# Patient Record
Sex: Female | Born: 1965
Health system: Southern US, Community
[De-identification: ages and names within clinical notes are randomized; demographics above are authoritative.]

## PROBLEM LIST (undated history)

## (undated) DIAGNOSIS — J45909 Unspecified asthma, uncomplicated: Secondary | ICD-10-CM

## (undated) DIAGNOSIS — J449 Chronic obstructive pulmonary disease, unspecified: Secondary | ICD-10-CM

## (undated) HISTORY — PX: REDUCTION MAMMAPLASTY: SUR839

---

## 1991-08-10 HISTORY — PX: BREAST REDUCTION SURGERY: SHX8

## 1997-03-11 HISTORY — PX: TUBAL LIGATION: SHX77

## 2003-12-11 ENCOUNTER — Emergency Department: Payer: Self-pay | Admitting: Emergency Medicine

## 2004-02-29 ENCOUNTER — Emergency Department: Payer: Self-pay | Admitting: Emergency Medicine

## 2004-08-18 ENCOUNTER — Emergency Department: Payer: Self-pay | Admitting: Emergency Medicine

## 2005-02-08 ENCOUNTER — Other Ambulatory Visit: Payer: Self-pay

## 2005-02-08 ENCOUNTER — Emergency Department: Payer: Self-pay | Admitting: Emergency Medicine

## 2005-03-16 ENCOUNTER — Emergency Department: Payer: Self-pay | Admitting: Emergency Medicine

## 2005-06-10 ENCOUNTER — Encounter: Payer: Self-pay | Admitting: Rheumatology

## 2005-08-13 ENCOUNTER — Ambulatory Visit: Payer: Self-pay | Admitting: Rheumatology

## 2007-06-13 ENCOUNTER — Other Ambulatory Visit: Payer: Self-pay

## 2007-06-13 ENCOUNTER — Emergency Department: Payer: Self-pay | Admitting: Unknown Physician Specialty

## 2009-02-20 ENCOUNTER — Ambulatory Visit: Payer: Self-pay | Admitting: Family Medicine

## 2009-03-11 HISTORY — PX: ABLATION: SHX5711

## 2010-10-18 ENCOUNTER — Ambulatory Visit: Payer: Self-pay | Admitting: Pain Medicine

## 2010-10-26 ENCOUNTER — Ambulatory Visit: Payer: Self-pay | Admitting: Pain Medicine

## 2011-04-22 DIAGNOSIS — J45909 Unspecified asthma, uncomplicated: Secondary | ICD-10-CM | POA: Diagnosis not present

## 2011-04-22 DIAGNOSIS — K5900 Constipation, unspecified: Secondary | ICD-10-CM | POA: Diagnosis not present

## 2011-04-22 DIAGNOSIS — J019 Acute sinusitis, unspecified: Secondary | ICD-10-CM | POA: Diagnosis not present

## 2011-04-29 DIAGNOSIS — K5900 Constipation, unspecified: Secondary | ICD-10-CM | POA: Diagnosis not present

## 2011-04-29 DIAGNOSIS — J45909 Unspecified asthma, uncomplicated: Secondary | ICD-10-CM | POA: Diagnosis not present

## 2011-04-29 DIAGNOSIS — J329 Chronic sinusitis, unspecified: Secondary | ICD-10-CM | POA: Diagnosis not present

## 2011-09-09 DIAGNOSIS — R609 Edema, unspecified: Secondary | ICD-10-CM | POA: Diagnosis not present

## 2011-09-09 DIAGNOSIS — J069 Acute upper respiratory infection, unspecified: Secondary | ICD-10-CM | POA: Diagnosis not present

## 2011-09-09 DIAGNOSIS — M2550 Pain in unspecified joint: Secondary | ICD-10-CM | POA: Diagnosis not present

## 2011-12-05 ENCOUNTER — Ambulatory Visit: Payer: Self-pay | Admitting: Family Medicine

## 2011-12-05 DIAGNOSIS — J309 Allergic rhinitis, unspecified: Secondary | ICD-10-CM | POA: Diagnosis not present

## 2011-12-05 DIAGNOSIS — E049 Nontoxic goiter, unspecified: Secondary | ICD-10-CM | POA: Diagnosis not present

## 2011-12-05 DIAGNOSIS — J45909 Unspecified asthma, uncomplicated: Secondary | ICD-10-CM | POA: Diagnosis not present

## 2011-12-05 DIAGNOSIS — R059 Cough, unspecified: Secondary | ICD-10-CM | POA: Diagnosis not present

## 2011-12-05 DIAGNOSIS — J329 Chronic sinusitis, unspecified: Secondary | ICD-10-CM | POA: Diagnosis not present

## 2011-12-17 ENCOUNTER — Ambulatory Visit: Payer: Self-pay | Admitting: Family Medicine

## 2011-12-17 DIAGNOSIS — E041 Nontoxic single thyroid nodule: Secondary | ICD-10-CM | POA: Diagnosis not present

## 2011-12-17 DIAGNOSIS — E042 Nontoxic multinodular goiter: Secondary | ICD-10-CM | POA: Diagnosis not present

## 2011-12-24 DIAGNOSIS — K219 Gastro-esophageal reflux disease without esophagitis: Secondary | ICD-10-CM | POA: Diagnosis not present

## 2011-12-24 DIAGNOSIS — R490 Dysphonia: Secondary | ICD-10-CM | POA: Diagnosis not present

## 2011-12-24 DIAGNOSIS — R6889 Other general symptoms and signs: Secondary | ICD-10-CM | POA: Diagnosis not present

## 2011-12-24 DIAGNOSIS — J3489 Other specified disorders of nose and nasal sinuses: Secondary | ICD-10-CM | POA: Diagnosis not present

## 2011-12-24 DIAGNOSIS — E041 Nontoxic single thyroid nodule: Secondary | ICD-10-CM | POA: Diagnosis not present

## 2011-12-24 DIAGNOSIS — J301 Allergic rhinitis due to pollen: Secondary | ICD-10-CM | POA: Diagnosis not present

## 2012-01-03 DIAGNOSIS — E049 Nontoxic goiter, unspecified: Secondary | ICD-10-CM | POA: Diagnosis not present

## 2012-01-03 DIAGNOSIS — J329 Chronic sinusitis, unspecified: Secondary | ICD-10-CM | POA: Diagnosis not present

## 2012-01-03 DIAGNOSIS — M549 Dorsalgia, unspecified: Secondary | ICD-10-CM | POA: Diagnosis not present

## 2012-01-03 DIAGNOSIS — J45909 Unspecified asthma, uncomplicated: Secondary | ICD-10-CM | POA: Diagnosis not present

## 2012-07-09 DIAGNOSIS — E049 Nontoxic goiter, unspecified: Secondary | ICD-10-CM | POA: Diagnosis not present

## 2012-07-09 DIAGNOSIS — J309 Allergic rhinitis, unspecified: Secondary | ICD-10-CM | POA: Diagnosis not present

## 2012-07-09 DIAGNOSIS — J45909 Unspecified asthma, uncomplicated: Secondary | ICD-10-CM | POA: Diagnosis not present

## 2012-07-13 ENCOUNTER — Ambulatory Visit: Payer: Self-pay | Admitting: Family Medicine

## 2012-07-13 DIAGNOSIS — K810 Acute cholecystitis: Secondary | ICD-10-CM | POA: Diagnosis not present

## 2012-07-13 DIAGNOSIS — R141 Gas pain: Secondary | ICD-10-CM | POA: Diagnosis not present

## 2012-07-13 DIAGNOSIS — R1084 Generalized abdominal pain: Secondary | ICD-10-CM | POA: Diagnosis not present

## 2012-07-20 ENCOUNTER — Ambulatory Visit: Payer: Medicare Other | Admitting: General Surgery

## 2012-07-30 ENCOUNTER — Ambulatory Visit: Payer: Medicare Other | Admitting: General Surgery

## 2012-08-07 DIAGNOSIS — M545 Low back pain: Secondary | ICD-10-CM | POA: Diagnosis not present

## 2012-08-07 DIAGNOSIS — M65849 Other synovitis and tenosynovitis, unspecified hand: Secondary | ICD-10-CM | POA: Diagnosis not present

## 2012-08-07 DIAGNOSIS — E049 Nontoxic goiter, unspecified: Secondary | ICD-10-CM | POA: Diagnosis not present

## 2012-08-07 DIAGNOSIS — K5900 Constipation, unspecified: Secondary | ICD-10-CM | POA: Diagnosis not present

## 2012-08-14 DIAGNOSIS — K5900 Constipation, unspecified: Secondary | ICD-10-CM | POA: Diagnosis not present

## 2012-08-14 DIAGNOSIS — J45909 Unspecified asthma, uncomplicated: Secondary | ICD-10-CM | POA: Diagnosis not present

## 2012-08-14 DIAGNOSIS — E049 Nontoxic goiter, unspecified: Secondary | ICD-10-CM | POA: Diagnosis not present

## 2012-08-14 DIAGNOSIS — J329 Chronic sinusitis, unspecified: Secondary | ICD-10-CM | POA: Diagnosis not present

## 2012-08-17 ENCOUNTER — Ambulatory Visit: Payer: Medicare Other | Admitting: General Surgery

## 2012-10-13 ENCOUNTER — Ambulatory Visit: Payer: Self-pay | Admitting: General Surgery

## 2012-11-10 ENCOUNTER — Encounter: Payer: Self-pay | Admitting: *Deleted

## 2013-03-18 DIAGNOSIS — E049 Nontoxic goiter, unspecified: Secondary | ICD-10-CM | POA: Diagnosis not present

## 2013-03-18 DIAGNOSIS — S93409A Sprain of unspecified ligament of unspecified ankle, initial encounter: Secondary | ICD-10-CM | POA: Diagnosis not present

## 2013-03-18 DIAGNOSIS — M25579 Pain in unspecified ankle and joints of unspecified foot: Secondary | ICD-10-CM | POA: Diagnosis not present

## 2013-03-18 DIAGNOSIS — K5900 Constipation, unspecified: Secondary | ICD-10-CM | POA: Diagnosis not present

## 2013-03-19 ENCOUNTER — Ambulatory Visit: Payer: Self-pay | Admitting: Family Medicine

## 2013-03-19 DIAGNOSIS — S8263XA Displaced fracture of lateral malleolus of unspecified fibula, initial encounter for closed fracture: Secondary | ICD-10-CM | POA: Diagnosis not present

## 2013-03-22 DIAGNOSIS — S93409A Sprain of unspecified ligament of unspecified ankle, initial encounter: Secondary | ICD-10-CM | POA: Diagnosis not present

## 2013-10-26 DIAGNOSIS — F17200 Nicotine dependence, unspecified, uncomplicated: Secondary | ICD-10-CM | POA: Diagnosis not present

## 2013-10-26 DIAGNOSIS — J449 Chronic obstructive pulmonary disease, unspecified: Secondary | ICD-10-CM | POA: Diagnosis not present

## 2013-10-26 DIAGNOSIS — R05 Cough: Secondary | ICD-10-CM | POA: Diagnosis not present

## 2013-10-26 DIAGNOSIS — R059 Cough, unspecified: Secondary | ICD-10-CM | POA: Diagnosis not present

## 2013-10-26 DIAGNOSIS — J329 Chronic sinusitis, unspecified: Secondary | ICD-10-CM | POA: Diagnosis not present

## 2013-11-17 DIAGNOSIS — F17200 Nicotine dependence, unspecified, uncomplicated: Secondary | ICD-10-CM | POA: Diagnosis not present

## 2013-11-17 DIAGNOSIS — J45909 Unspecified asthma, uncomplicated: Secondary | ICD-10-CM | POA: Diagnosis not present

## 2013-11-17 DIAGNOSIS — J40 Bronchitis, not specified as acute or chronic: Secondary | ICD-10-CM | POA: Diagnosis not present

## 2013-11-17 DIAGNOSIS — J309 Allergic rhinitis, unspecified: Secondary | ICD-10-CM | POA: Diagnosis not present

## 2014-05-09 DIAGNOSIS — J329 Chronic sinusitis, unspecified: Secondary | ICD-10-CM | POA: Diagnosis not present

## 2014-05-09 DIAGNOSIS — R319 Hematuria, unspecified: Secondary | ICD-10-CM | POA: Diagnosis not present

## 2014-05-09 DIAGNOSIS — K319 Disease of stomach and duodenum, unspecified: Secondary | ICD-10-CM | POA: Diagnosis not present

## 2014-05-09 DIAGNOSIS — M549 Dorsalgia, unspecified: Secondary | ICD-10-CM | POA: Diagnosis not present

## 2014-05-09 DIAGNOSIS — F900 Attention-deficit hyperactivity disorder, predominantly inattentive type: Secondary | ICD-10-CM | POA: Diagnosis not present

## 2014-08-05 DIAGNOSIS — J45909 Unspecified asthma, uncomplicated: Secondary | ICD-10-CM | POA: Diagnosis not present

## 2014-08-05 DIAGNOSIS — J0100 Acute maxillary sinusitis, unspecified: Secondary | ICD-10-CM | POA: Diagnosis not present

## 2014-08-05 DIAGNOSIS — M549 Dorsalgia, unspecified: Secondary | ICD-10-CM | POA: Diagnosis not present

## 2014-08-05 DIAGNOSIS — F900 Attention-deficit hyperactivity disorder, predominantly inattentive type: Secondary | ICD-10-CM | POA: Diagnosis not present

## 2014-08-11 ENCOUNTER — Telehealth: Payer: Self-pay | Admitting: Family Medicine

## 2014-08-11 ENCOUNTER — Other Ambulatory Visit: Payer: Self-pay

## 2014-08-11 MED ORDER — FLUCONAZOLE 150 MG PO TABS: 150.0000 mg | ORAL_TABLET | Freq: Once | ORAL | Status: DC

## 2014-08-11 NOTE — Addendum Note (Signed)
Addended by: Jerrell Belfast on: 08/11/2014 04:18 PM   Modules accepted: Orders

## 2014-08-11 NOTE — Telephone Encounter (Signed)
Pt contacted office for refill request on the following medications: Diflucan 150 mg. Pt stated the antibiotic has caused a yeast infection and she would like to have 2 doses sent into [facility]. Thanks TNP

## 2014-09-07 ENCOUNTER — Other Ambulatory Visit: Payer: Self-pay | Admitting: Family Medicine

## 2014-09-07 DIAGNOSIS — F988 Other specified behavioral and emotional disorders with onset usually occurring in childhood and adolescence: Secondary | ICD-10-CM

## 2014-09-07 DIAGNOSIS — M2550 Pain in unspecified joint: Secondary | ICD-10-CM

## 2014-09-07 MED ORDER — OXYCODONE-ACETAMINOPHEN 10-325 MG PO TABS: 1.0000 | ORAL_TABLET | Freq: Three times a day (TID) | ORAL | Status: DC | PRN

## 2014-09-07 MED ORDER — AMPHETAMINE-DEXTROAMPHET ER 30 MG PO CP24: 30.0000 mg | ORAL_CAPSULE | ORAL | Status: DC

## 2014-09-07 NOTE — Telephone Encounter (Signed)
Pt called wanting refill on Adderall and indocet?  [facility]  [phone].  tp

## 2014-09-07 NOTE — Telephone Encounter (Signed)
Prescription printed. Please notify patient it is ready for pick up. Thanks- Dr. Zariya Minner.  

## 2014-09-08 DIAGNOSIS — M545 Low back pain, unspecified: Secondary | ICD-10-CM | POA: Insufficient documentation

## 2014-09-08 DIAGNOSIS — M778 Other enthesopathies, not elsewhere classified: Secondary | ICD-10-CM | POA: Insufficient documentation

## 2014-09-08 DIAGNOSIS — S82899A Other fracture of unspecified lower leg, initial encounter for closed fracture: Secondary | ICD-10-CM | POA: Insufficient documentation

## 2014-09-08 DIAGNOSIS — F419 Anxiety disorder, unspecified: Secondary | ICD-10-CM | POA: Insufficient documentation

## 2014-09-08 DIAGNOSIS — Z720 Tobacco use: Secondary | ICD-10-CM | POA: Insufficient documentation

## 2014-09-08 DIAGNOSIS — E010 Iodine-deficiency related diffuse (endemic) goiter: Secondary | ICD-10-CM | POA: Insufficient documentation

## 2014-09-08 DIAGNOSIS — M2550 Pain in unspecified joint: Secondary | ICD-10-CM | POA: Insufficient documentation

## 2014-09-08 DIAGNOSIS — E669 Obesity, unspecified: Secondary | ICD-10-CM | POA: Insufficient documentation

## 2014-09-08 DIAGNOSIS — F410 Panic disorder [episodic paroxysmal anxiety] without agoraphobia: Secondary | ICD-10-CM | POA: Insufficient documentation

## 2014-09-08 DIAGNOSIS — D649 Anemia, unspecified: Secondary | ICD-10-CM | POA: Insufficient documentation

## 2014-09-08 DIAGNOSIS — G8929 Other chronic pain: Secondary | ICD-10-CM | POA: Insufficient documentation

## 2014-09-08 DIAGNOSIS — K5909 Other constipation: Secondary | ICD-10-CM | POA: Insufficient documentation

## 2014-09-08 DIAGNOSIS — E7800 Pure hypercholesterolemia, unspecified: Secondary | ICD-10-CM | POA: Insufficient documentation

## 2014-09-08 DIAGNOSIS — M7070 Other bursitis of hip, unspecified hip: Secondary | ICD-10-CM | POA: Insufficient documentation

## 2014-09-08 DIAGNOSIS — K838 Other specified diseases of biliary tract: Secondary | ICD-10-CM | POA: Insufficient documentation

## 2014-09-08 DIAGNOSIS — J45909 Unspecified asthma, uncomplicated: Secondary | ICD-10-CM | POA: Insufficient documentation

## 2014-09-08 DIAGNOSIS — M549 Dorsalgia, unspecified: Secondary | ICD-10-CM | POA: Insufficient documentation

## 2014-09-08 DIAGNOSIS — F909 Attention-deficit hyperactivity disorder, unspecified type: Secondary | ICD-10-CM | POA: Insufficient documentation

## 2014-09-08 DIAGNOSIS — Z8669 Personal history of other diseases of the nervous system and sense organs: Secondary | ICD-10-CM | POA: Insufficient documentation

## 2014-09-08 DIAGNOSIS — F988 Other specified behavioral and emotional disorders with onset usually occurring in childhood and adolescence: Secondary | ICD-10-CM | POA: Insufficient documentation

## 2014-09-08 DIAGNOSIS — J449 Chronic obstructive pulmonary disease, unspecified: Secondary | ICD-10-CM | POA: Insufficient documentation

## 2014-09-08 DIAGNOSIS — M25579 Pain in unspecified ankle and joints of unspecified foot: Secondary | ICD-10-CM | POA: Insufficient documentation

## 2014-09-08 DIAGNOSIS — R609 Edema, unspecified: Secondary | ICD-10-CM | POA: Insufficient documentation

## 2014-09-08 DIAGNOSIS — M419 Scoliosis, unspecified: Secondary | ICD-10-CM | POA: Insufficient documentation

## 2014-09-08 DIAGNOSIS — K219 Gastro-esophageal reflux disease without esophagitis: Secondary | ICD-10-CM | POA: Insufficient documentation

## 2014-09-15 ENCOUNTER — Ambulatory Visit (INDEPENDENT_AMBULATORY_CARE_PROVIDER_SITE_OTHER): Payer: Medicare Other | Admitting: Physician Assistant

## 2014-09-15 ENCOUNTER — Encounter: Payer: Self-pay | Admitting: Physician Assistant

## 2014-09-15 VITALS — BP 106/70 | HR 72 | Temp 98.5°F | Resp 16

## 2014-09-15 DIAGNOSIS — B351 Tinea unguium: Secondary | ICD-10-CM | POA: Diagnosis not present

## 2014-09-15 NOTE — Progress Notes (Signed)
   Subjective:    Patient ID: Kathleen Buchanan, female    DOB: [birth year], 49 y.o.   MRN: [id number]  HPI Kathleen Buchanan is a 49 yr old female that presents to the office today with complaints of "ingrown toenails" on her bilateral great toes with discoloration of all her toe nails.  She does have pain when wearing shoes due to the pressure of her nail on the toe box of the shoe.  She states she has always had thick nails that grow really fast, but over the last 4-6 months they have become much thicker and she is unable to cut them herself.  She felt she may have an ingrown toenail, but denies any pain surrounding the nail plate, redness, swelling or drainage.     Review of Systems  Constitutional: Negative.   HENT: Negative.   Eyes: Negative.   Respiratory: Negative.   Cardiovascular: Negative.   Gastrointestinal: Negative.   Endocrine: Negative.   Genitourinary: Negative.   Musculoskeletal: Negative.   Skin: Negative.   Allergic/Immunologic: Negative.   Neurological: Negative.   Hematological: Negative.   Psychiatric/Behavioral: Negative.        Objective:   Physical Exam  Constitutional: She appears well-developed and well-nourished. No distress.  Skin: Skin is warm, dry and intact. No bruising, no lesion and no rash noted. She is not diaphoretic. No erythema.  All toe nails bilaterally are thick, flaky and yellow in coloration.  The great toe nails bilaterally are thick and coiled in appearance.    Vitals reviewed.         Assessment & Plan:  1. Onychomycosis Will hold off on oral treatment at this time due to interactions with other medications and appearance of the nail.  Will refer to podiatry for further consultation and treatment.  I feel she may best benefit from nail avulsion therapy of the great toe nails along with adjunctive therapy. - Ambulatory referral to Podiatry

## 2014-09-15 NOTE — Patient Instructions (Signed)
Ringworm, Nail A fungal infection of the nail (tinea unguium/onychomycosis) is common. It is common as the visible part of the nail is composed of dead cells which have no blood supply to help prevent infection. It occurs because fungi are everywhere and will pick any opportunity to grow on any dead material. Because nails are very slow growing they require up to 2 years of treatment with anti-fungal medications. The entire nail back to the base is infected. This includes approximately  of the nail which you cannot see. If your caregiver has prescribed a medication by mouth, take it every day and as directed. No progress will be seen for at least 6 to 9 months. Do not be disappointed! Because fungi live on dead cells with little or no exposure to blood supply, medication delivery to the infection is slow; thus the cure is slow. It is also why you can observe no progress in the first 6 months. The nail becoming cured is the base of the nail, as it has the blood supply. Topical medication such as creams and ointments are usually not effective. Important in successful treatment of nail fungus is closely following the medication regimen that your doctor prescribes. Sometimes you and your caregiver may elect to speed up this process by surgical removal of all the nails. Even this may still require 6 to 9 months of additional oral medications. See your caregiver as directed. Remember there will be no visible improvement for at least 6 months. See your caregiver sooner if other signs of infection (redness and swelling) develop. Document Released: 02/23/2000 Document Revised: 05/20/2011 Document Reviewed: 05/03/2008 Exit[employer] Patient Information 2015 [employer]. This information is not intended to replace advice given to you by your health care provider. Make sure you discuss any questions you have with your health care provider.  

## 2014-09-27 DIAGNOSIS — L03032 Cellulitis of left toe: Secondary | ICD-10-CM | POA: Diagnosis not present

## 2014-09-27 DIAGNOSIS — B353 Tinea pedis: Secondary | ICD-10-CM | POA: Diagnosis not present

## 2014-09-27 DIAGNOSIS — L03031 Cellulitis of right toe: Secondary | ICD-10-CM | POA: Diagnosis not present

## 2014-10-06 ENCOUNTER — Other Ambulatory Visit: Payer: Self-pay

## 2014-10-06 ENCOUNTER — Other Ambulatory Visit: Payer: Self-pay | Admitting: Family Medicine

## 2014-10-06 DIAGNOSIS — M778 Other enthesopathies, not elsewhere classified: Secondary | ICD-10-CM

## 2014-10-06 DIAGNOSIS — F988 Other specified behavioral and emotional disorders with onset usually occurring in childhood and adolescence: Secondary | ICD-10-CM

## 2014-10-06 DIAGNOSIS — M419 Scoliosis, unspecified: Secondary | ICD-10-CM

## 2014-10-06 DIAGNOSIS — M2550 Pain in unspecified joint: Secondary | ICD-10-CM

## 2014-10-06 DIAGNOSIS — M7070 Other bursitis of hip, unspecified hip: Secondary | ICD-10-CM

## 2014-10-06 MED ORDER — AMPHETAMINE-DEXTROAMPHETAMINE 30 MG PO TABS: 1.0000 | ORAL_TABLET | Freq: Two times a day (BID) | ORAL | Status: DC | PRN

## 2014-10-06 MED ORDER — OXYCODONE-ACETAMINOPHEN 7.5-325 MG PO TABS: 1.0000 | ORAL_TABLET | Freq: Two times a day (BID) | ORAL | Status: DC

## 2014-10-06 NOTE — Telephone Encounter (Signed)
Please put in order for Endocet and give date of last fill from [facility]. Thanks.

## 2014-10-06 NOTE — Telephone Encounter (Signed)
Pt contacted office for refill request on the following medications: amphetamine-dextroamphetamine (ADDERALL) 30 MG tablet & Endocet 7.5-325 mg. Pt stated that she needed a refill for Endocet and I verified the 7.5-325mg . It looks like 09/07/14 she was given Percocet 10-325 mg but pt asked for Endocet. Thanks TNP

## 2014-10-06 NOTE — Telephone Encounter (Signed)
Last Endocet refill is 08/07/2014

## 2014-10-06 NOTE — Telephone Encounter (Signed)
Printed.  Please notify patient. Thanks.  

## 2014-10-06 NOTE — Telephone Encounter (Signed)
Left message to call back. Need to left her know to pick up her prescription and that she needs an ov for medication refills.

## 2014-10-06 NOTE — Telephone Encounter (Signed)
Notified pt as below. Pt verbalized fully understanding to make ov for medication refill.

## 2014-10-11 DIAGNOSIS — L03032 Cellulitis of left toe: Secondary | ICD-10-CM | POA: Diagnosis not present

## 2014-10-11 DIAGNOSIS — B353 Tinea pedis: Secondary | ICD-10-CM | POA: Diagnosis not present

## 2014-10-11 DIAGNOSIS — L03031 Cellulitis of right toe: Secondary | ICD-10-CM | POA: Diagnosis not present

## 2014-11-01 DIAGNOSIS — L03032 Cellulitis of left toe: Secondary | ICD-10-CM | POA: Diagnosis not present

## 2014-11-01 DIAGNOSIS — L03031 Cellulitis of right toe: Secondary | ICD-10-CM | POA: Diagnosis not present

## 2014-11-08 ENCOUNTER — Encounter: Payer: Self-pay | Admitting: Family Medicine

## 2014-11-08 ENCOUNTER — Ambulatory Visit (INDEPENDENT_AMBULATORY_CARE_PROVIDER_SITE_OTHER): Payer: Medicare Other | Admitting: Family Medicine

## 2014-11-08 VITALS — BP 112/70 | HR 72 | Temp 97.7°F | Resp 16 | Ht 62.0 in | Wt 213.0 lb

## 2014-11-08 DIAGNOSIS — M778 Other enthesopathies, not elsewhere classified: Secondary | ICD-10-CM | POA: Diagnosis not present

## 2014-11-08 DIAGNOSIS — M545 Low back pain, unspecified: Secondary | ICD-10-CM | POA: Insufficient documentation

## 2014-11-08 DIAGNOSIS — M2550 Pain in unspecified joint: Secondary | ICD-10-CM | POA: Diagnosis not present

## 2014-11-08 DIAGNOSIS — M7070 Other bursitis of hip, unspecified hip: Secondary | ICD-10-CM

## 2014-11-08 DIAGNOSIS — R51 Headache: Secondary | ICD-10-CM

## 2014-11-08 DIAGNOSIS — J0100 Acute maxillary sinusitis, unspecified: Secondary | ICD-10-CM

## 2014-11-08 DIAGNOSIS — M419 Scoliosis, unspecified: Secondary | ICD-10-CM | POA: Diagnosis not present

## 2014-11-08 DIAGNOSIS — G8929 Other chronic pain: Secondary | ICD-10-CM | POA: Diagnosis not present

## 2014-11-08 DIAGNOSIS — Z720 Tobacco use: Secondary | ICD-10-CM

## 2014-11-08 DIAGNOSIS — J449 Chronic obstructive pulmonary disease, unspecified: Secondary | ICD-10-CM

## 2014-11-08 MED ORDER — OXYCODONE-ACETAMINOPHEN 7.5-325 MG PO TABS: 1.0000 | ORAL_TABLET | Freq: Two times a day (BID) | ORAL | Status: DC

## 2014-11-08 MED ORDER — ALBUTEROL SULFATE HFA 108 (90 BASE) MCG/ACT IN AERS: 2.0000 | INHALATION_SPRAY | RESPIRATORY_TRACT | Status: DC | PRN

## 2014-11-08 MED ORDER — MOMETASONE FUROATE 50 MCG/ACT NA SUSP: 2.0000 | Freq: Every day | NASAL | Status: DC

## 2014-11-08 MED ORDER — AMOXICILLIN-POT CLAVULANATE 875-125 MG PO TABS: 1.0000 | ORAL_TABLET | Freq: Two times a day (BID) | ORAL | Status: DC

## 2014-11-08 NOTE — Progress Notes (Signed)
Patient ID: Kathleen Buchanan, female   DOB: 1965/04/10, 49 y.o.   MRN: [id number]         Patient: Kathleen Buchanan Female    DOB: 1965/07/19   49 y.o.   MRN: [id number] Visit Date: 11/08/2014  Today's Provider: Margarita Rana, MD   No chief complaint on file.  Subjective:    Sinusitis This is a new problem. The current episode started in the past 7 days. The problem has been gradually worsening since onset. There has been no fever. Associated symptoms include congestion, ear pain (Ears feel stopped up.), headaches, sinus pressure, sneezing, a sore throat and swollen glands. Pertinent negatives include no chills, coughing, diaphoresis, hoarse voice, neck pain or shortness of breath. Past treatments include oral decongestants. The treatment provided no relief.  Back Pain This is a chronic (Pt is coming in today for a follow up on her Chronic Low Back pain.  She reports her pain is in good control with her current pain medication.  She tolerates her Oxycodone/Acetaminophen 7.6/325mg  well. ) problem. The problem occurs constantly. The problem is unchanged. The pain is present in the sacro-iliac. The quality of the pain is described as aching. The pain radiates to the left thigh and right thigh. Stiffness is present in the morning and at night. Associated symptoms include headaches and leg pain. Pertinent negatives include no abdominal pain, bladder incontinence, bowel incontinence, chest pain, fever, paresis, paresthesias, pelvic pain, perianal numbness, tingling or weakness.  Headache  This is a chronic problem. The current episode started more than 1 year ago. The problem occurs intermittently. The problem has been unchanged. The pain is located in the left unilateral region. The pain does not radiate. The quality of the pain is described as throbbing. Associated symptoms include back pain, ear pain (Ears feel stopped up.), sinus pressure, a sore throat and swollen glands. Pertinent negatives include  no abdominal pain, coughing, fever, hearing loss, nausea, neck pain, rhinorrhea, tingling, tinnitus, vomiting or weakness. Nothing aggravates the symptoms. She has tried acetaminophen and NSAIDs for the symptoms. The treatment provided no relief.       Allergies  Allergen Reactions  . Codeine Hives  . Erythromycin   . Hydrocodone-Acetaminophen Hives  . Lansoprazole   . Levofloxacin   . Morphine Sulfate   . Migraine Formula  [Aspirin-Acetaminophen-Caffeine]   . Omeprazole   . Pepcid  [Famotidine]   . Sulfa Antibiotics    Previous Medications   ALPRAZOLAM (XANAX) 1 MG TABLET    Take 1 tablet by mouth 2 (two) times daily.   AMPHETAMINE-DEXTROAMPHETAMINE (ADDERALL) 30 MG TABLET    Take 1 tablet by mouth 2 (two) times daily as needed.   ASCORBIC ACID (VITAMIN C) 500 MG TABLET    Take 1 tablet by mouth daily.   ESOMEPRAZOLE (NEXIUM) 40 MG CAPSULE    Take 1 capsule by mouth daily.   IBUPROFEN (ADVIL,MOTRIN) 200 MG TABLET    Take 4 tablets by mouth as needed.   MULTIPLE VITAMIN PO    Take 1 tablet by mouth daily.   OXYCODONE-ACETAMINOPHEN (ENDOCET) 7.5-325 MG PER TABLET    Take 1 tablet by mouth 2 (two) times daily.    Review of Systems  Constitutional: Positive for fatigue. Negative for fever, chills, diaphoresis, activity change, appetite change and unexpected weight change.  HENT: Positive for congestion, ear pain (Ears feel stopped up.), sinus pressure, sneezing and sore throat. Negative for ear discharge, facial swelling, hearing loss, hoarse voice, mouth sores, nosebleeds, postnasal  drip, rhinorrhea, tinnitus, trouble swallowing and voice change.   Respiratory: Negative for apnea, cough, choking, chest tightness, shortness of breath, wheezing and stridor.   Cardiovascular: Negative for chest pain, palpitations and leg swelling.  Gastrointestinal: Negative for nausea, vomiting, abdominal pain, diarrhea, constipation, blood in stool, abdominal distention, anal bleeding, rectal pain and  bowel incontinence.  Genitourinary: Negative for bladder incontinence and pelvic pain.  Musculoskeletal: Positive for back pain. Negative for neck pain.  Neurological: Positive for headaches. Negative for tingling, weakness and paresthesias.    Social History  Substance Use Topics  . Smoking status: Current Every Day Smoker  . Smokeless tobacco: Not on file  . Alcohol Use: No   Objective:   BP 112/70 mmHg  Pulse 72  Temp(Src) 97.7 F (36.5 C) (Oral)  Resp 16  Ht 5\' 2"  (1.575 m)  Wt 213 lb (96.616 kg)  BMI 38.95 kg/m2  Physical Exam  Constitutional: She is oriented to person, place, and time. She appears well-developed and well-nourished.  HENT:  Head: Normocephalic and atraumatic.  Right Ear: External ear normal.  Left Ear: External ear normal.  Mouth/Throat: Oropharynx is clear and moist.  Eyes: Conjunctivae and EOM are normal. Pupils are equal, round, and reactive to light.  Neck: Normal range of motion. Neck supple.  Cardiovascular: Normal rate and regular rhythm.   Pulmonary/Chest: Effort normal and breath sounds normal.  Neurological: She is alert and oriented to person, place, and time.  Psychiatric: She has a normal mood and affect. Her behavior is normal. Judgment and thought content normal.      Assessment & Plan:     1. Chronic obstructive pulmonary disease, unspecified COPD, unspecified chronic bronchitis type Refill medicatoin.  - albuterol (PROVENTIL HFA;VENTOLIN HFA) 108 (90 BASE) MCG/ACT inhaler; Inhale 2 puffs into the lungs every 4 (four) hours as needed for wheezing or shortness of breath.  Dispense: 1 Inhaler; Refill: 5  2. Acute maxillary sinusitis, recurrence not specified Condition is worsening. Will start medication for better control.   - amoxicillin-clavulanate (AUGMENTIN) 875-125 MG per tablet; Take 1 tablet by mouth 2 (two) times daily.  Dispense: 42 tablet; Refill: 0 - mometasone (NASONEX) 50 MCG/ACT nasal spray; Place 2 sprays into the nose  daily.  Dispense: 17 g; Refill: 5  3. Chronic nonintractable headache, unspecified headache type Unclear if related to sinuses. Will treat and refer if does improve.    4. Chronic low back pain Check tox screen. Continue medication.  - Pain Management Screening Profile (10S)  5. Current tobacco use Encouraged continued cessation attempts.   6. Arthralgia Continue medication.  - oxyCODONE-acetaminophen (ENDOCET) 7.5-325 MG per tablet; Take 1 tablet by mouth 2 (two) times daily.  Dispense: 60 tablet; Refill: 0  7. Tendinitis of wrist As above.  - oxyCODONE-acetaminophen (ENDOCET) 7.5-325 MG per tablet; Take 1 tablet by mouth 2 (two) times daily.  Dispense: 60 tablet; Refill: 0      Margarita Rana, MD  [facility]

## 2014-11-10 LAB — PMP SCREEN PROFILE (10S), URINE
Amphetamine Screen, Ur: NEGATIVE ng/mL
Barbiturate Screen, Ur: NEGATIVE ng/mL
Benzodiazepine Screen, Urine: NEGATIVE ng/mL
CANNABINOIDS UR QL SCN: NEGATIVE ng/mL
Cocaine(Metab.)Screen, Urine: NEGATIVE ng/mL
Creatinine(Crt), U: 191.1 mg/dL (ref 20.0–300.0)
Methadone Scn, Ur: NEGATIVE ng/mL
Opiate Scrn, Ur: NEGATIVE ng/mL
Oxycodone+Oxymorphone Ur Ql Scn: POSITIVE ng/mL
PCP SCRN UR: NEGATIVE ng/mL
Ph of Urine: 5.5 (ref 4.5–8.9)
Propoxyphene, Screen: NEGATIVE ng/mL

## 2014-11-11 ENCOUNTER — Telehealth: Payer: Self-pay

## 2014-11-11 DIAGNOSIS — B373 Candidiasis of vulva and vagina: Secondary | ICD-10-CM

## 2014-11-11 DIAGNOSIS — B3731 Acute candidiasis of vulva and vagina: Secondary | ICD-10-CM

## 2014-11-11 NOTE — Telephone Encounter (Signed)
-----   Message from Margarita Rana, MD sent at 11/10/2014  8:24 AM EDT ----- Urine tox screen as expected. Please notify patient. Thanks.

## 2014-11-11 NOTE — Telephone Encounter (Signed)
Left message to call back  

## 2014-11-17 MED ORDER — FLUCONAZOLE 150 MG PO TABS: 150.0000 mg | ORAL_TABLET | Freq: Every day | ORAL | Status: DC

## 2014-11-17 NOTE — Telephone Encounter (Signed)
Advised pt of lab results. Pt verbally acknowledges understanding. Pt requesting rx for Diflucan because pt is currently on abx. [facility]. Renaldo Fiddler, CMA

## 2014-11-17 NOTE — Telephone Encounter (Signed)
NA will try calling again. sd

## 2014-12-07 ENCOUNTER — Other Ambulatory Visit: Payer: Self-pay | Admitting: Family Medicine

## 2014-12-07 DIAGNOSIS — M778 Other enthesopathies, not elsewhere classified: Secondary | ICD-10-CM

## 2014-12-07 DIAGNOSIS — M419 Scoliosis, unspecified: Secondary | ICD-10-CM

## 2014-12-07 DIAGNOSIS — F988 Other specified behavioral and emotional disorders with onset usually occurring in childhood and adolescence: Secondary | ICD-10-CM

## 2014-12-07 DIAGNOSIS — M2550 Pain in unspecified joint: Secondary | ICD-10-CM

## 2014-12-07 DIAGNOSIS — M7070 Other bursitis of hip, unspecified hip: Secondary | ICD-10-CM

## 2014-12-07 MED ORDER — OXYCODONE-ACETAMINOPHEN 7.5-325 MG PO TABS: 1.0000 | ORAL_TABLET | Freq: Two times a day (BID) | ORAL | Status: DC

## 2014-12-07 MED ORDER — AMPHETAMINE-DEXTROAMPHETAMINE 30 MG PO TABS: 1.0000 | ORAL_TABLET | Freq: Two times a day (BID) | ORAL | Status: DC | PRN

## 2014-12-07 NOTE — Telephone Encounter (Signed)
Printed.  Please notify patient. Thanks.  

## 2014-12-07 NOTE — Telephone Encounter (Signed)
Pt contacted office for refill request on the following medications:  amphetamine-dextroamphetamine (ADDERALL) 30 MG and oxyCODONE-acetaminophen (ENDOCET) 7.5-325.  CB#[phone]/MW

## 2015-01-05 ENCOUNTER — Other Ambulatory Visit: Payer: Self-pay | Admitting: Family Medicine

## 2015-01-05 DIAGNOSIS — M7070 Other bursitis of hip, unspecified hip: Secondary | ICD-10-CM

## 2015-01-05 DIAGNOSIS — M778 Other enthesopathies, not elsewhere classified: Secondary | ICD-10-CM

## 2015-01-05 DIAGNOSIS — M419 Scoliosis, unspecified: Secondary | ICD-10-CM

## 2015-01-05 DIAGNOSIS — M2550 Pain in unspecified joint: Secondary | ICD-10-CM

## 2015-01-05 DIAGNOSIS — F988 Other specified behavioral and emotional disorders with onset usually occurring in childhood and adolescence: Secondary | ICD-10-CM

## 2015-01-05 MED ORDER — OXYCODONE-ACETAMINOPHEN 7.5-325 MG PO TABS: 1.0000 | ORAL_TABLET | Freq: Two times a day (BID) | ORAL | Status: DC

## 2015-01-05 MED ORDER — AMPHETAMINE-DEXTROAMPHETAMINE 30 MG PO TABS: 1.0000 | ORAL_TABLET | Freq: Two times a day (BID) | ORAL | Status: DC | PRN

## 2015-01-05 NOTE — Telephone Encounter (Signed)
Prescription printed. Please notify patient it is ready for pick up. Thanks- Dr. Karizma Cheek.  

## 2015-01-05 NOTE — Telephone Encounter (Signed)
Pt contacted office for refill request on the following medications: CB#[id number]/MW   oxyCODONE-acetaminophen (ENDOCET) 7.5-325   amphetamine-dextroamphetamine (ADDERALL) 30 MG

## 2015-01-18 ENCOUNTER — Other Ambulatory Visit: Payer: Self-pay | Admitting: Family Medicine

## 2015-01-18 DIAGNOSIS — F419 Anxiety disorder, unspecified: Secondary | ICD-10-CM

## 2015-01-18 NOTE — Telephone Encounter (Signed)
Printed, please fax or call in to pharmacy. Thank you.   

## 2015-01-19 NOTE — Telephone Encounter (Signed)
Printed, please fax or call in to pharmacy. Thank you.   

## 2015-02-06 ENCOUNTER — Other Ambulatory Visit: Payer: Self-pay | Admitting: Family Medicine

## 2015-02-06 DIAGNOSIS — M7070 Other bursitis of hip, unspecified hip: Secondary | ICD-10-CM

## 2015-02-06 DIAGNOSIS — M778 Other enthesopathies, not elsewhere classified: Secondary | ICD-10-CM

## 2015-02-06 DIAGNOSIS — M2550 Pain in unspecified joint: Secondary | ICD-10-CM

## 2015-02-06 DIAGNOSIS — F988 Other specified behavioral and emotional disorders with onset usually occurring in childhood and adolescence: Secondary | ICD-10-CM

## 2015-02-06 DIAGNOSIS — M419 Scoliosis, unspecified: Secondary | ICD-10-CM

## 2015-02-06 MED ORDER — OXYCODONE-ACETAMINOPHEN 7.5-325 MG PO TABS: 1.0000 | ORAL_TABLET | Freq: Two times a day (BID) | ORAL | Status: DC

## 2015-02-06 MED ORDER — AMPHETAMINE-DEXTROAMPHETAMINE 30 MG PO TABS: 1.0000 | ORAL_TABLET | Freq: Two times a day (BID) | ORAL | Status: DC | PRN

## 2015-02-06 NOTE — Telephone Encounter (Signed)
Pt contacted office for refill request on the following medications:  [phone]/MW  amphetamine-dextroamphetamine (ADDERALL) 30 MG tablet  oxyCODONE-acetaminophen (ENDOCET) 7.5-325 MG tablet

## 2015-02-06 NOTE — Telephone Encounter (Signed)
Prescription printed. Please notify patient it is ready for pick up. Thanks- Dr. Reynold Mantell.  

## 2015-02-20 ENCOUNTER — Encounter: Payer: Self-pay | Admitting: Family Medicine

## 2015-02-20 ENCOUNTER — Ambulatory Visit (INDEPENDENT_AMBULATORY_CARE_PROVIDER_SITE_OTHER): Payer: Medicare Other | Admitting: Family Medicine

## 2015-02-20 VITALS — BP 110/72 | HR 72 | Temp 98.5°F | Resp 16

## 2015-02-20 DIAGNOSIS — J40 Bronchitis, not specified as acute or chronic: Secondary | ICD-10-CM | POA: Diagnosis not present

## 2015-02-20 MED ORDER — AMOXICILLIN-POT CLAVULANATE 875-125 MG PO TABS: 1.0000 | ORAL_TABLET | Freq: Two times a day (BID) | ORAL | Status: DC

## 2015-02-20 NOTE — Progress Notes (Signed)
Patient ID: Kathleen Buchanan, female   DOB: 1965-08-08, 49 y.o.   MRN: [id number]    Subjective:  HPI Pt is here today for URI symptoms. She reports that it started about a week ago. Started out with a headaches and sore throat. Then she started congestion and coughing. She reports that she is coughing up green sputum and also from her nose. Both having blood mixed in them. She has had some wheezing and shortness of breath but has been using her inhaler. She asked if we had an injection of an antibiotic we could give her to get her over this faster.   Prior to Admission medications   Medication Sig Start Date End Date Taking? Authorizing Provider  albuterol (PROVENTIL HFA;VENTOLIN HFA) 108 (90 BASE) MCG/ACT inhaler Inhale 2 puffs into the lungs every 4 (four) hours as needed for wheezing or shortness of breath. 11/08/14  Yes Margarita Rana, MD  ALPRAZolam Duanne Moron) 1 MG tablet TAKE 1 TABLET BY MOUTH TWICE A DAY 01/19/15  Yes Margarita Rana, MD  amphetamine-dextroamphetamine (ADDERALL) 30 MG tablet Take 1 tablet by mouth 2 (two) times daily as needed. 02/06/15  Yes Margarita Rana, MD  ascorbic acid (VITAMIN C) 500 MG tablet Take 1 tablet by mouth daily.   Yes Historical Provider, MD  esomeprazole (NEXIUM) 40 MG capsule Take 1 capsule by mouth daily. 06/06/14  Yes Historical Provider, MD  ibuprofen (ADVIL,MOTRIN) 200 MG tablet Take 4 tablets by mouth as needed.   Yes Historical Provider, MD  mometasone (NASONEX) 50 MCG/ACT nasal spray Place 2 sprays into the nose daily. 11/08/14  Yes Margarita Rana, MD  MULTIPLE VITAMIN PO Take 1 tablet by mouth daily.   Yes Historical Provider, MD  oxyCODONE-acetaminophen (ENDOCET) 7.5-325 MG tablet Take 1 tablet by mouth 2 (two) times daily. 02/06/15  Yes Margarita Rana, MD    Patient Active Problem List   Diagnosis Date Noted  . Chronic low back pain 11/08/2014  . Onychomycosis 09/15/2014  . ADD (attention deficit disorder) 09/08/2014  . Ankle pain 09/08/2014  . Ache  in joint 09/08/2014  . Anxiety 09/08/2014  . Airway hyperreactivity 09/08/2014  . Ankle fracture 09/08/2014  . Back ache 09/08/2014  . Bursitis of hip 09/08/2014  . Chronic anemia 09/08/2014  . Chronic constipation 09/08/2014  . Chronic LBP 09/08/2014  . CAFL (chronic airflow limitation) ([place]) 09/08/2014  . Accumulation of fluid in tissues 09/08/2014  . Biliary sludge 09/08/2014  . Acid reflux 09/08/2014  . History of migraine headaches 09/08/2014  . Hypercholesteremia 09/08/2014  . Adiposity 09/08/2014  . Episodic paroxysmal anxiety disorder 09/08/2014  . Scoliosis 09/08/2014  . Tendinitis of wrist 09/08/2014  . Big thyroid 09/08/2014  . Current tobacco use 09/08/2014    History reviewed. No pertinent past medical history.  Social History   Social History  . Marital Status: Single    Spouse Name: N/A  . Number of Children: N/A  . Years of Education: N/A   Occupational History  . Not on file.   Social History Main Topics  . Smoking status: Current Every Day Smoker  . Smokeless tobacco: Not on file     Comment: "Im down to one a day"  . Alcohol Use: No  . Drug Use: No  . Sexual Activity: Not on file   Other Topics Concern  . Not on file   Social History Narrative    Allergies  Allergen Reactions  . Codeine Hives  . Erythromycin   . Hydrocodone-Acetaminophen Hives  .  Lansoprazole   . Levofloxacin   . Morphine Sulfate   . Migraine Formula  [Aspirin-Acetaminophen-Caffeine]   . Omeprazole   . Pepcid  [Famotidine]   . Sulfa Antibiotics     Review of Systems  Constitutional: Positive for malaise/fatigue.  HENT: Positive for congestion and sore throat.   Eyes: Negative.   Respiratory: Positive for cough, sputum production, shortness of breath and wheezing.   Cardiovascular: Negative.   Gastrointestinal: Negative.   Genitourinary: Negative.   Musculoskeletal: Negative.   Skin: Negative.   Neurological: Positive for headaches.  Endo/Heme/Allergies:  Negative.   Psychiatric/Behavioral: Negative.      There is no immunization history on file for this patient. Objective:  BP 110/72 mmHg  Pulse 72  Temp(Src) 98.5 F (36.9 C) (Oral)  Resp 16  Wt   SpO2 98%  Physical Exam  Constitutional: She is oriented to person, place, and time and well-developed, well-nourished, and in no distress.  HENT:  Head: Normocephalic and atraumatic.  Left Ear: External ear normal.  Nose: Nose normal.  Throat erythemas with exudate. Right TM erythemas.  Eyes: EOM are normal. Pupils are equal, round, and reactive to light.  Neck: Normal range of motion. Neck supple.  Pulmonary/Chest: Effort normal and breath sounds normal.  inspiratory and exspirtory rhonchi   Abdominal: Soft. Bowel sounds are normal.  Musculoskeletal: Normal range of motion.  Neurological: She is alert and oriented to person, place, and time. She has normal reflexes. Gait normal. GCS score is 15.  Skin: Skin is warm and dry.  Psychiatric: Mood, memory, affect and judgment normal.    No results found for: WBC, HGB, HCT, PLT, GLUCOSE, CHOL, TRIG, HDL, LDLDIRECT, LDLCALC, TSH, PSA, INR, GLUF, HGBA1C, MICROALBUR  CMP  No results found for: NA, K, CL, CO2, GLUCOSE, BUN, CREATININE, CALCIUM, PROT, ALBUMIN, AST, ALT, ALKPHOS, BILITOT, GFRNONAA, GFRAA  Assessment and Plan :  1. Bronchitis  - amoxicillin-clavulanate (AUGMENTIN) 875-125 MG tablet; Take 1 tablet by mouth 2 (two) times daily.  Dispense: 20 tablet; Refill: 0  Patient was seen and examined by Dr. Miguel Aschoff, and noted scribed by Webb Laws, [place] MD [facility] 02/20/2015 2:08 PM

## 2015-03-08 ENCOUNTER — Other Ambulatory Visit: Payer: Self-pay | Admitting: Family Medicine

## 2015-03-08 DIAGNOSIS — M2550 Pain in unspecified joint: Secondary | ICD-10-CM

## 2015-03-08 DIAGNOSIS — M419 Scoliosis, unspecified: Secondary | ICD-10-CM

## 2015-03-08 DIAGNOSIS — F988 Other specified behavioral and emotional disorders with onset usually occurring in childhood and adolescence: Secondary | ICD-10-CM

## 2015-03-08 DIAGNOSIS — M778 Other enthesopathies, not elsewhere classified: Secondary | ICD-10-CM

## 2015-03-08 DIAGNOSIS — M7070 Other bursitis of hip, unspecified hip: Secondary | ICD-10-CM

## 2015-03-08 MED ORDER — OXYCODONE-ACETAMINOPHEN 7.5-325 MG PO TABS: 1.0000 | ORAL_TABLET | Freq: Two times a day (BID) | ORAL | Status: DC

## 2015-03-08 MED ORDER — AMPHETAMINE-DEXTROAMPHETAMINE 30 MG PO TABS: 1.0000 | ORAL_TABLET | Freq: Two times a day (BID) | ORAL | Status: DC | PRN

## 2015-03-08 NOTE — Telephone Encounter (Signed)
Prescription printed. Please notify patient it is ready for pick up. Thanks- Dr. Tandi Hanko.  

## 2015-03-08 NOTE — Telephone Encounter (Signed)
Last refill on both of these medications were 02/06/2015. Renaldo Fiddler, CMA

## 2015-03-08 NOTE — Telephone Encounter (Signed)
amphetamine-dextroamphetamine (ADDERALL) 30 MG tablet  oxyCODONE-acetaminophen (ENDOCET) 7.5-325 MG tablet  Pt is requesting a refill of the above medications.

## 2015-03-17 ENCOUNTER — Ambulatory Visit (INDEPENDENT_AMBULATORY_CARE_PROVIDER_SITE_OTHER): Payer: Medicare Other | Admitting: Physician Assistant

## 2015-03-17 ENCOUNTER — Encounter: Payer: Self-pay | Admitting: Physician Assistant

## 2015-03-17 VITALS — BP 110/69 | HR 69 | Temp 97.4°F | Resp 16

## 2015-03-17 DIAGNOSIS — R739 Hyperglycemia, unspecified: Secondary | ICD-10-CM | POA: Diagnosis not present

## 2015-03-17 DIAGNOSIS — R42 Dizziness and giddiness: Secondary | ICD-10-CM

## 2015-03-17 DIAGNOSIS — J069 Acute upper respiratory infection, unspecified: Secondary | ICD-10-CM

## 2015-03-17 MED ORDER — DOXYCYCLINE MONOHYDRATE 100 MG PO CAPS: 100.0000 mg | ORAL_CAPSULE | Freq: Two times a day (BID) | ORAL | Status: DC

## 2015-03-17 MED ORDER — MECLIZINE HCL 25 MG PO TABS: 25.0000 mg | ORAL_TABLET | Freq: Three times a day (TID) | ORAL | Status: DC | PRN

## 2015-03-17 NOTE — Progress Notes (Signed)
Patient: Kathleen Buchanan Female    DOB: December 17, 1965   50 y.o.   MRN: [id number] Visit Date: 03/17/2015  Today's Provider: Mar Daring, PA-C   Chief Complaint  Patient presents with  . Dizziness  . Otalgia   Subjective:     Patient here concern also about her sugar levels been high at home. Per patient is feeling more thirsty and using bathroom more frequently. Per patient checked her levels at home. Report fasting level at home are 169-195. No leg swelling, chest pain. Patient complains about feeling more fatigue and unable to sleep.   Dizziness This is a new problem. The current episode started more than 1 month ago. The problem occurs constantly. The problem has been gradually worsening. Associated symptoms include coughing (a little), fatigue (sometimes), a sore throat, vertigo and a visual change. Pertinent negatives include no chest pain, chills, congestion, fever, headaches, nausea, neck pain, numbness, vomiting or weakness. The symptoms are aggravated by walking and standing. She has tried nothing for the symptoms. The treatment provided no relief.  Otalgia  There is pain in both ears. This is a recurrent problem. The current episode started 1 to 4 weeks ago. The problem occurs constantly. The problem has been unchanged. There has been no fever. Associated symptoms include coughing (a little) and a sore throat. Pertinent negatives include no ear discharge, headaches, neck pain or vomiting. Associated symptoms comments: Ears feel like they have fluid.. She has tried antibiotics for the symptoms. The treatment provided no relief.  She was most recently treated for bronchitis on February 20, 2015 by Dr. Rosanna Randy. She was treated with Augmentin. She also was seen in August 2016 by Dr. Venia Minks for sinusitis and was treated with Augmentin then as well. She states that she does not feel her symptoms have improved much since being seen on December 12.     Allergies  Allergen  Reactions  . Codeine Hives  . Erythromycin   . Hydrocodone-Acetaminophen Hives  . Lansoprazole   . Levofloxacin   . Morphine Sulfate   . Migraine Formula  [Aspirin-Acetaminophen-Caffeine]   . Omeprazole   . Pepcid  [Famotidine]   . Sulfa Antibiotics    Previous Medications   ALBUTEROL (PROVENTIL HFA;VENTOLIN HFA) 108 (90 BASE) MCG/ACT INHALER    Inhale 2 puffs into the lungs every 4 (four) hours as needed for wheezing or shortness of breath.   ALPRAZOLAM (XANAX) 1 MG TABLET    TAKE 1 TABLET BY MOUTH TWICE A DAY   AMPHETAMINE-DEXTROAMPHETAMINE (ADDERALL) 30 MG TABLET    Take 1 tablet by mouth 2 (two) times daily as needed.   ASCORBIC ACID (VITAMIN C) 500 MG TABLET    Take 1 tablet by mouth daily.   ESOMEPRAZOLE (NEXIUM) 40 MG CAPSULE    Take 1 capsule by mouth daily.   IBUPROFEN (ADVIL,MOTRIN) 200 MG TABLET    Take 4 tablets by mouth as needed.   MOMETASONE (NASONEX) 50 MCG/ACT NASAL SPRAY    Place 2 sprays into the nose daily.   MULTIPLE VITAMIN PO    Take 1 tablet by mouth daily.   OXYCODONE-ACETAMINOPHEN (ENDOCET) 7.5-325 MG TABLET    Take 1 tablet by mouth 2 (two) times daily.    Review of Systems  Constitutional: Positive for fatigue (sometimes). Negative for fever and chills.  HENT: Positive for ear pain and sore throat. Negative for congestion and ear discharge.   Respiratory: Positive for cough (a little).   Cardiovascular: Negative  for chest pain, palpitations and leg swelling.  Gastrointestinal: Negative for nausea and vomiting.  Genitourinary: Positive for urgency.  Musculoskeletal: Negative for neck pain.  Neurological: Positive for dizziness and vertigo. Negative for weakness, numbness and headaches.    Social History  Substance Use Topics  . Smoking status: Current Every Day Smoker  . Smokeless tobacco: Not on file     Comment: "Im down to one a day"  . Alcohol Use: No   Objective:   BP 110/69 mmHg  Pulse 69  Temp(Src) 97.4 F (36.3 C) (Oral)  Resp 16  Wt     Physical Exam  Constitutional: She is oriented to person, place, and time. She appears well-developed and well-nourished. No distress.  HENT:  Head: Normocephalic and atraumatic.  Right Ear: Hearing, external ear and ear canal normal. Tympanic membrane is not erythematous and not bulging. A middle ear effusion is present.  Left Ear: Hearing, external ear and ear canal normal. Tympanic membrane is not erythematous and not bulging. A middle ear effusion is present.  Nose: Mucosal edema and rhinorrhea present. Right sinus exhibits maxillary sinus tenderness and frontal sinus tenderness. Left sinus exhibits maxillary sinus tenderness and frontal sinus tenderness.  Mouth/Throat: Uvula is midline, oropharynx is clear and moist and mucous membranes are normal. No oropharyngeal exudate, posterior oropharyngeal edema or posterior oropharyngeal erythema.  Eyes: Conjunctivae and EOM are normal. Pupils are equal, round, and reactive to light. Right eye exhibits no discharge. Left eye exhibits no discharge. No scleral icterus. Right eye exhibits no nystagmus. Left eye exhibits no nystagmus.  Neck: Normal range of motion. Neck supple. No tracheal deviation present. No thyromegaly present.  Cardiovascular: Normal rate, regular rhythm and normal heart sounds.  Exam reveals no gallop and no friction rub.   No murmur heard. Pulmonary/Chest: Effort normal and breath sounds normal. No stridor. No respiratory distress. She has no wheezes. She has no rales.  Lymphadenopathy:    She has no cervical adenopathy.  Neurological: She is alert and oriented to person, place, and time. She has normal strength. No cranial nerve deficit or sensory deficit. She displays a negative Romberg sign. Coordination and gait normal.  Skin: She is not diaphoretic.  Vitals reviewed.       Assessment & Plan:     1. Blood glucose elevated HgBA1c in the office today was 5.2. - POCT HgB A1C  2. Vertigo I do feel the vertigo is  secondary to the ETD and middle ear effusion.  I will give meclizine as below for treatment.  She is to call the office if she develops worsening symptoms.  I also advised for her to go to the hospital in the meantime if she develops double vision, nausea with not-intractable vomiting, severe headache or unsteady gait. - meclizine (ANTIVERT) 25 MG tablet; Take 1 tablet (25 mg total) by mouth 3 (three) times daily as needed for dizziness.  Dispense: 30 tablet; Refill: 0  3. Upper respiratory infection Being that she has failed symptom relief with augmentin twice and due to her medical allergies I will treat with doxycycline as below.  She is to stay well hydrated and get plenty of rest.  She is to call the office if symptoms fail to improve or worsen. - doxycycline (MONODOX) 100 MG capsule; Take 1 capsule (100 mg total) by mouth 2 (two) times daily.  Dispense: 20 capsule; Refill: 0       Mar Daring, PA-C  [facility]

## 2015-03-17 NOTE — Patient Instructions (Signed)
Upper Respiratory Infection, Adult Most upper respiratory infections (URIs) are a viral infection of the air passages leading to the lungs. A URI affects the nose, throat, and upper air passages. The most common type of URI is nasopharyngitis and is typically referred to as "the common cold." URIs run their course and usually go away on their own. Most of the time, a URI does not require medical attention, but sometimes a bacterial infection in the upper airways can follow a viral infection. This is called a secondary infection. Sinus and middle ear infections are common types of secondary upper respiratory infections. Bacterial pneumonia can also complicate a URI. A URI can worsen asthma and chronic obstructive pulmonary disease (COPD). Sometimes, these complications can require emergency medical care and may be life threatening.  CAUSES Almost all URIs are caused by viruses. A virus is a type of germ and can spread from one person to another.  RISKS FACTORS You may be at risk for a URI if:   You smoke.   You have chronic heart or lung disease.  You have a weakened defense (immune) system.   You are very young or very old.   You have nasal allergies or asthma.  You work in crowded or poorly ventilated areas.  You work in health care facilities or schools. SIGNS AND SYMPTOMS  Symptoms typically develop 2-3 days after you come in contact with a cold virus. Most viral URIs last 7-10 days. However, viral URIs from the influenza virus (flu virus) can last 14-18 days and are typically more severe. Symptoms may include:   Runny or stuffy (congested) nose.   Sneezing.   Cough.   Sore throat.   Headache.   Fatigue.   Fever.   Loss of appetite.   Pain in your forehead, behind your eyes, and over your cheekbones (sinus pain).  Muscle aches.  DIAGNOSIS  Your health care provider may diagnose a URI by:  Physical exam.  Tests to check that your symptoms are not due to  another condition such as:  Strep throat.  Sinusitis.  Pneumonia.  Asthma. TREATMENT  A URI goes away on its own with time. It cannot be cured with medicines, but medicines may be prescribed or recommended to relieve symptoms. Medicines may help:  Reduce your fever.  Reduce your cough.  Relieve nasal congestion. HOME CARE INSTRUCTIONS   Take medicines only as directed by your health care provider.   Gargle warm saltwater or take cough drops to comfort your throat as directed by your health care provider.  Use a warm mist humidifier or inhale steam from a shower to increase air moisture. This may make it easier to breathe.  Drink enough fluid to keep your urine clear or pale yellow.   Eat soups and other clear broths and maintain good nutrition.   Rest as needed.   Return to work when your temperature has returned to normal or as your health care provider advises. You may need to stay home longer to avoid infecting others. You can also use a face mask and careful hand washing to prevent spread of the virus.  Increase the usage of your inhaler if you have asthma.   Do not use any tobacco products, including cigarettes, chewing tobacco, or electronic cigarettes. If you need help quitting, ask your health care provider. PREVENTION  The best way to protect yourself from getting a cold is to practice good hygiene.   Avoid oral or hand contact with people with cold   symptoms.   Wash your hands often if contact occurs.  There is no clear evidence that vitamin C, vitamin E, echinacea, or exercise reduces the chance of developing a cold. However, it is always recommended to get plenty of rest, exercise, and practice good nutrition.  SEEK MEDICAL CARE IF:   You are getting worse rather than better.   Your symptoms are not controlled by medicine.   You have chills.  You have worsening shortness of breath.  You have brown or red mucus.  You have yellow or brown nasal  discharge.  You have pain in your face, especially when you bend forward.  You have a fever.  You have swollen neck glands.  You have pain while swallowing.  You have white areas in the back of your throat. SEEK IMMEDIATE MEDICAL CARE IF:   You have severe or persistent:  Headache.  Ear pain.  Sinus pain.  Chest pain.  You have chronic lung disease and any of the following:  Wheezing.  Prolonged cough.  Coughing up blood.  A change in your usual mucus.  You have a stiff neck.  You have changes in your:  Vision.  Hearing.  Thinking.  Mood. MAKE SURE YOU:   Understand these instructions.  Will watch your condition.  Will get help right away if you are not doing well or get worse.   This information is not intended to replace advice given to you by your health care provider. Make sure you discuss any questions you have with your health care provider.   Document Released: 08/21/2000 Document Revised: 07/12/2014 Document Reviewed: 06/02/2013 Elsevier Interactive Patient Education 2016 [place] tablets or capsules What is this medicine? MECLIZINE (MEK li zeen) is an antihistamine. It is used to prevent nausea, vomiting, or dizziness caused by motion sickness. It is also used to prevent and treat vertigo (extreme dizziness or a feeling that you or your surroundings are tilting or spinning around). This medicine may be used for other purposes; ask your health care provider or pharmacist if you have questions. What should I tell my health care provider before I take this medicine? They need to know if you have any of these conditions: -glaucoma -lung or breathing disease, like asthma -problems urinating -prostate disease -stomach or intestine problems -an unusual or allergic reaction to meclizine, other medicines, foods, dyes, or preservatives -pregnant or trying to get pregnant -breast-feeding How should I use this medicine? Take this  medicine by mouth with a glass of water. Follow the directions on the prescription label. If you are using this medicine to prevent motion sickness, take the dose at least 1 hour before travel. If it upsets your stomach, take it with food or milk. Take your doses at regular intervals. Do not take your medicine more often than directed. Talk to your pediatrician regarding the use of this medicine in children. Special care may be needed. Overdosage: If you think you have taken too much of this medicine contact a poison control center or emergency room at once. NOTE: This medicine is only for you. Do not share this medicine with others. What if I miss a dose? If you miss a dose, take it as soon as you can. If it is almost time for your next dose, take only that dose. Do not take double or extra doses. What may interact with this medicine? Do not take this medicine with any of the following medications: -MAOIs like Carbex, Eldepryl, Marplan, Nardil, and Parnate This  medicine may also interact with the following medications: -alcohol -antihistamines for allergy, cough and cold -certain medicines for anxiety or sleep -certain medicines for depression, like amitriptyline, fluoxetine, sertraline -certain medicines for seizures like phenobarbital, primidone -general anesthetics like halothane, isoflurane, methoxyflurane, propofol -local anesthetics like lidocaine, pramoxine, tetracaine -medicines that relax muscles for surgery -narcotic medicines for pain -phenothiazines like chlorpromazine, mesoridazine, prochlorperazine, thioridazine This list may not describe all possible interactions. Give your health care provider a list of all the medicines, herbs, non-prescription drugs, or dietary supplements you use. Also tell them if you smoke, drink alcohol, or use illegal drugs. Some items may interact with your medicine. What should I watch for while using this medicine? Tell your doctor or healthcare  professional if your symptoms do not start to get better or if they get worse. You may get drowsy or dizzy. Do not drive, use machinery, or do anything that needs mental alertness until you know how this medicine affects you. Do not stand or sit up quickly, especially if you are an older patient. This reduces the risk of dizzy or fainting spells. Alcohol may interfere with the effect of this medicine. Avoid alcoholic drinks. Your mouth may get dry. Chewing sugarless gum or sucking hard candy, and drinking plenty of water may help. Contact your doctor if the problem does not go away or is severe. This medicine may cause dry eyes and blurred vision. If you wear contact lenses you may feel some discomfort. Lubricating drops may help. See your eye doctor if the problem does not go away or is severe. What side effects may I notice from receiving this medicine? Side effects that you should report to your doctor or health care professional as soon as possible: -feeling faint or lightheaded, falls -fast, irregular heartbeat Side effects that usually do not require medical attention (report these to your doctor or health care professional if they continue or are bothersome): -constipation -headache -trouble passing urine or change in the amount of urine -trouble sleeping -upset stomach This list may not describe all possible side effects. Call your doctor for medical advice about side effects. You may report side effects to FDA at 1-800-FDA-1088. Where should I keep my medicine? Keep out of the reach of children. Store at room temperature between 15 and 30 degrees C (59 and 86 degrees F). Keep container tightly closed. Throw away any unused medicine after the expiration date. NOTE: This sheet is a summary. It may not cover all possible information. If you have questions about this medicine, talk to your doctor, pharmacist, or health care provider.    2016, Elsevier/Gold Standard. (2014-09-01  09:20:57) Benign Positional Vertigo Vertigo is the feeling that you or your surroundings are moving when they are not. Benign positional vertigo is the most common form of vertigo. The cause of this condition is not serious (is benign). This condition is triggered by certain movements and positions (is positional). This condition can be dangerous if it occurs while you are doing something that could endanger you or others, such as driving.  CAUSES In many cases, the cause of this condition is not known. It may be caused by a disturbance in an area of the inner ear that helps your brain to sense movement and balance. This disturbance can be caused by a viral infection (labyrinthitis), head injury, or repetitive motion. RISK FACTORS This condition is more likely to develop in:  Women.  People who are 48 years of age or older. SYMPTOMS Symptoms of this  condition usually happen when you move your head or your eyes in different directions. Symptoms may start suddenly, and they usually last for less than a minute. Symptoms may include:  Loss of balance and falling.  Feeling like you are spinning or moving.  Feeling like your surroundings are spinning or moving.  Nausea and vomiting.  Blurred vision.  Dizziness.  Involuntary eye movement (nystagmus). Symptoms can be mild and cause only slight annoyance, or they can be severe and interfere with daily life. Episodes of benign positional vertigo may return (recur) over time, and they may be triggered by certain movements. Symptoms may improve over time. DIAGNOSIS This condition is usually diagnosed by medical history and a physical exam of the head, neck, and ears. You may be referred to a health care provider who specializes in ear, nose, and throat (ENT) problems (otolaryngologist) or a provider who specializes in disorders of the nervous system (neurologist). You may have additional testing, including:  MRI.  A CT scan.  Eye movement  tests. Your health care provider may ask you to change positions quickly while he or she watches you for symptoms of benign positional vertigo, such as nystagmus. Eye movement may be tested with an electronystagmogram (ENG), caloric stimulation, the Dix-Hallpike test, or the roll test.  An electroencephalogram (EEG). This records electrical activity in your brain.  Hearing tests. TREATMENT Usually, your health care provider will treat this by moving your head in specific positions to adjust your inner ear back to normal. Surgery may be needed in severe cases, but this is rare. In some cases, benign positional vertigo may resolve on its own in 2-4 weeks. HOME CARE INSTRUCTIONS Safety  Move slowly.Avoid sudden body or head movements.  Avoid driving.  Avoid operating heavy machinery.  Avoid doing any tasks that would be dangerous to you or others if a vertigo episode would occur.  If you have trouble walking or keeping your balance, try using a cane for stability. If you feel dizzy or unstable, sit down right away.  Return to your normal activities as told by your health care provider. Ask your health care provider what activities are safe for you. General Instructions  Take over-the-counter and prescription medicines only as told by your health care provider.  Avoid certain positions or movements as told by your health care provider.  Drink enough fluid to keep your urine clear or pale yellow.  Keep all follow-up visits as told by your health care provider. This is important. SEEK MEDICAL CARE IF:  You have a fever.  Your condition gets worse or you develop new symptoms.  Your family or friends notice any behavioral changes.  Your nausea or vomiting gets worse.  You have numbness or a "pins and needles" sensation. SEEK IMMEDIATE MEDICAL CARE IF:  You have difficulty speaking or moving.  You are always dizzy.  You faint.  You develop severe headaches.  You have  weakness in your legs or arms.  You have changes in your hearing or vision.  You develop a stiff neck.  You develop sensitivity to light.   This information is not intended to replace advice given to you by your health care provider. Make sure you discuss any questions you have with your health care provider.   Document Released: 12/03/2005 Document Revised: 11/16/2014 Document Reviewed: 06/20/2014 Elsevier Interactive Patient Education [employer].

## 2015-04-06 ENCOUNTER — Other Ambulatory Visit: Payer: Self-pay | Admitting: Family Medicine

## 2015-04-06 DIAGNOSIS — M778 Other enthesopathies, not elsewhere classified: Secondary | ICD-10-CM

## 2015-04-06 DIAGNOSIS — F988 Other specified behavioral and emotional disorders with onset usually occurring in childhood and adolescence: Secondary | ICD-10-CM

## 2015-04-06 DIAGNOSIS — M7070 Other bursitis of hip, unspecified hip: Secondary | ICD-10-CM

## 2015-04-06 DIAGNOSIS — M2550 Pain in unspecified joint: Secondary | ICD-10-CM

## 2015-04-06 DIAGNOSIS — M419 Scoliosis, unspecified: Secondary | ICD-10-CM

## 2015-04-06 MED ORDER — OXYCODONE-ACETAMINOPHEN 7.5-325 MG PO TABS: 1.0000 | ORAL_TABLET | Freq: Two times a day (BID) | ORAL | Status: DC

## 2015-04-06 MED ORDER — AMPHETAMINE-DEXTROAMPHETAMINE 30 MG PO TABS: 1.0000 | ORAL_TABLET | Freq: Two times a day (BID) | ORAL | Status: DC | PRN

## 2015-04-06 NOTE — Telephone Encounter (Signed)
Pt needs refil on her   amphetamine-dextroamphetamine (ADDERALL) 30 MG tablet oxyCODONE-acetaminophen (ENDOCET) 7.5-325 MG tablet   Thanks Con Memos

## 2015-04-10 ENCOUNTER — Telehealth: Payer: Self-pay | Admitting: *Deleted

## 2015-04-10 DIAGNOSIS — M7070 Other bursitis of hip, unspecified hip: Secondary | ICD-10-CM

## 2015-04-10 DIAGNOSIS — F988 Other specified behavioral and emotional disorders with onset usually occurring in childhood and adolescence: Secondary | ICD-10-CM

## 2015-04-10 DIAGNOSIS — M419 Scoliosis, unspecified: Secondary | ICD-10-CM

## 2015-04-10 DIAGNOSIS — M2550 Pain in unspecified joint: Secondary | ICD-10-CM

## 2015-04-10 DIAGNOSIS — M778 Other enthesopathies, not elsewhere classified: Secondary | ICD-10-CM

## 2015-04-10 MED ORDER — OXYCODONE-ACETAMINOPHEN 7.5-325 MG PO TABS: 1.0000 | ORAL_TABLET | Freq: Two times a day (BID) | ORAL | Status: DC

## 2015-04-10 MED ORDER — AMPHETAMINE-DEXTROAMPHETAMINE 30 MG PO TABS: 1.0000 | ORAL_TABLET | Freq: Two times a day (BID) | ORAL | Status: DC | PRN

## 2015-04-10 NOTE — Telephone Encounter (Signed)
Rx's reprinted.

## 2015-05-08 ENCOUNTER — Other Ambulatory Visit: Payer: Self-pay | Admitting: Family Medicine

## 2015-05-08 DIAGNOSIS — F988 Other specified behavioral and emotional disorders with onset usually occurring in childhood and adolescence: Secondary | ICD-10-CM

## 2015-05-08 DIAGNOSIS — M778 Other enthesopathies, not elsewhere classified: Secondary | ICD-10-CM

## 2015-05-08 DIAGNOSIS — M7070 Other bursitis of hip, unspecified hip: Secondary | ICD-10-CM

## 2015-05-08 DIAGNOSIS — M419 Scoliosis, unspecified: Secondary | ICD-10-CM

## 2015-05-08 DIAGNOSIS — M2550 Pain in unspecified joint: Secondary | ICD-10-CM

## 2015-05-08 MED ORDER — OXYCODONE-ACETAMINOPHEN 7.5-325 MG PO TABS: 1.0000 | ORAL_TABLET | Freq: Two times a day (BID) | ORAL | Status: DC

## 2015-05-08 MED ORDER — AMPHETAMINE-DEXTROAMPHETAMINE 30 MG PO TABS: 1.0000 | ORAL_TABLET | Freq: Two times a day (BID) | ORAL | Status: DC | PRN

## 2015-05-08 NOTE — Telephone Encounter (Signed)
Pt contacted office for refill request on the following medications:  CB#[phone]/MW  amphetamine-dextroamphetamine (ADDERALL) 30 MG tablet  oxyCODONE-acetaminophen (ENDOCET) 7.5-325 MG tablet

## 2015-05-08 NOTE — Telephone Encounter (Signed)
Prescription printed. Please notify patient it is ready for pick up. Thanks- Dr. Jonalyn Sedlak.  

## 2015-05-17 ENCOUNTER — Telehealth: Payer: Self-pay | Admitting: Physician Assistant

## 2015-05-17 DIAGNOSIS — J069 Acute upper respiratory infection, unspecified: Secondary | ICD-10-CM

## 2015-05-17 MED ORDER — DOXYCYCLINE MONOHYDRATE 100 MG PO CAPS: 100.0000 mg | ORAL_CAPSULE | Freq: Two times a day (BID) | ORAL | Status: DC

## 2015-05-17 NOTE — Telephone Encounter (Signed)
Please advise 

## 2015-05-17 NOTE — Telephone Encounter (Signed)
Patient advised as directed below. Patient verbalized understanding.  

## 2015-05-17 NOTE — Telephone Encounter (Signed)
Pt contacted office for refill request on the following medications: doxycycline (MONODOX) 100 MG capsule to [facility].   Pt stated she got better after taking medication in Jan 2017 but since Saturday 05/13/15 she has felt bad and has stayed  in the bed. Pt stated that she can not come in for a OV because her head hurts so bad from the sinus pressure and congestion that she can not drive and doesn't have anyone that can bring her today. Please advise. Thanks TNP

## 2015-05-17 NOTE — Telephone Encounter (Signed)
Medication has been sent to [facility].  She will need to be seen if no improvement.

## 2015-06-06 ENCOUNTER — Other Ambulatory Visit: Payer: Self-pay | Admitting: Family Medicine

## 2015-06-06 DIAGNOSIS — F988 Other specified behavioral and emotional disorders with onset usually occurring in childhood and adolescence: Secondary | ICD-10-CM

## 2015-06-06 DIAGNOSIS — M778 Other enthesopathies, not elsewhere classified: Secondary | ICD-10-CM

## 2015-06-06 DIAGNOSIS — M7070 Other bursitis of hip, unspecified hip: Secondary | ICD-10-CM

## 2015-06-06 DIAGNOSIS — M419 Scoliosis, unspecified: Secondary | ICD-10-CM

## 2015-06-06 DIAGNOSIS — M2550 Pain in unspecified joint: Secondary | ICD-10-CM

## 2015-06-06 MED ORDER — OXYCODONE-ACETAMINOPHEN 7.5-325 MG PO TABS: 1.0000 | ORAL_TABLET | Freq: Two times a day (BID) | ORAL | Status: DC

## 2015-06-06 MED ORDER — AMPHETAMINE-DEXTROAMPHETAMINE 30 MG PO TABS: 1.0000 | ORAL_TABLET | Freq: Two times a day (BID) | ORAL | Status: DC | PRN

## 2015-06-06 NOTE — Telephone Encounter (Signed)
Pt contacted office for refill request on the following medications: 1. oxyCODONE-acetaminophen (ENDOCET) 7.5-325 MG tablet   2. amphetamine-dextroamphetamine (ADDERALL) 30 MG tablet. Pt stated she will be out of the medication on 06/07/15 and was last written on 05/08/15. Pt was advised that Dr. Venia Minks is out of the office this week and that we would ask another provider to write the RX. Please advise.

## 2015-06-06 NOTE — Telephone Encounter (Signed)
Left message informing pt that prescriptions are ready for PU, and to FU with [facility] for next refill. Renaldo Fiddler, CMA

## 2015-06-06 NOTE — Telephone Encounter (Signed)
You can print her  month of refills  but then she needs an appointment. Probably go ahead and make it with [facility] Dr. Venia Minks is leaving. I will not be taking over the ongoing care of this patient.

## 2015-06-06 NOTE — Telephone Encounter (Signed)
Would you agree to fill this? Renaldo Fiddler, CMA

## 2015-07-07 ENCOUNTER — Ambulatory Visit (INDEPENDENT_AMBULATORY_CARE_PROVIDER_SITE_OTHER): Payer: Medicare Other | Admitting: Family Medicine

## 2015-07-07 ENCOUNTER — Encounter: Payer: Self-pay | Admitting: Family Medicine

## 2015-07-07 VITALS — BP 112/68 | HR 60 | Temp 98.4°F | Resp 16 | Wt 219.0 lb

## 2015-07-07 DIAGNOSIS — G8929 Other chronic pain: Secondary | ICD-10-CM

## 2015-07-07 DIAGNOSIS — M7070 Other bursitis of hip, unspecified hip: Secondary | ICD-10-CM | POA: Diagnosis not present

## 2015-07-07 DIAGNOSIS — M778 Other enthesopathies, not elsewhere classified: Secondary | ICD-10-CM

## 2015-07-07 DIAGNOSIS — E010 Iodine-deficiency related diffuse (endemic) goiter: Secondary | ICD-10-CM

## 2015-07-07 DIAGNOSIS — F909 Attention-deficit hyperactivity disorder, unspecified type: Secondary | ICD-10-CM | POA: Diagnosis not present

## 2015-07-07 DIAGNOSIS — M2550 Pain in unspecified joint: Secondary | ICD-10-CM

## 2015-07-07 DIAGNOSIS — R609 Edema, unspecified: Secondary | ICD-10-CM

## 2015-07-07 DIAGNOSIS — E7800 Pure hypercholesterolemia, unspecified: Secondary | ICD-10-CM | POA: Diagnosis not present

## 2015-07-07 DIAGNOSIS — M419 Scoliosis, unspecified: Secondary | ICD-10-CM

## 2015-07-07 DIAGNOSIS — M545 Low back pain, unspecified: Secondary | ICD-10-CM

## 2015-07-07 DIAGNOSIS — F988 Other specified behavioral and emotional disorders with onset usually occurring in childhood and adolescence: Secondary | ICD-10-CM

## 2015-07-07 DIAGNOSIS — E049 Nontoxic goiter, unspecified: Secondary | ICD-10-CM

## 2015-07-07 MED ORDER — AMPHETAMINE-DEXTROAMPHETAMINE 30 MG PO TABS: 1.0000 | ORAL_TABLET | Freq: Two times a day (BID) | ORAL | Status: DC | PRN

## 2015-07-07 MED ORDER — OXYCODONE-ACETAMINOPHEN 7.5-325 MG PO TABS: 1.0000 | ORAL_TABLET | Freq: Two times a day (BID) | ORAL | Status: DC

## 2015-07-07 MED ORDER — HYDROCHLOROTHIAZIDE 12.5 MG PO TABS: 12.5000 mg | ORAL_TABLET | Freq: Every day | ORAL | Status: DC

## 2015-07-07 NOTE — Progress Notes (Signed)
Patient ID: Scharlene Gloss, female   DOB: 18-May-1965, 50 y.o.   MRN: [id number]         Patient: Kathleen Buchanan Female    DOB: Jul 25, 1965   50 y.o.   MRN: [id number] Visit Date: 07/07/2015  Today's Provider: Margarita Rana, MD   Chief Complaint  Patient presents with  . ADHD  . Back Pain   Subjective:    Back Pain This is a chronic problem. The problem occurs constantly. The problem is unchanged. The pain is present in the lumbar spine and thoracic spine (Worse in lower back). Radiates to: Radiates down both legs. The pain is at a severity of 8/10 (Pt reports once she takes her pain medications her pain level goes down. ). The pain is the same all the time. The symptoms are aggravated by standing (Standing for long periods of time.). Stiffness is present all day and in the morning (Worse in the morning). Associated symptoms include headaches, numbness (Chronic issue only occasionally) and weakness. Pertinent negatives include no abdominal pain, bladder incontinence, bowel incontinence, chest pain, dysuria, fever, paresis, paresthesias or tingling. She has tried home exercises and analgesics for the symptoms. The treatment provided moderate relief.   Adult ADD and ADHD  Are you currently taking medications for ADD/ADHD? yes  How much improvement have you seen on this medication? Significant    Any side effects form the medication you have noticed? Occasional slight headache.   Please answer these questions (on a scale of 1 - 10 with 1 being least and  10 being most) as to how you currently feel while on the medication:   How easily distracted are you? 1  Do you have difficulty reading or listening to others? 1  Do you struggle to complete tasks? 1  Is your home, office, desk, or car extremely messy or cluttered? 1  Do you procrastinate? 1  Do you have trouble starting and finishing projects? 1  Are you chronically late? 1  Do you frequently forget appointments, commitments, and  deadlines? 1  Do you constantly lose or misplace things (keys, wallet, phone, documents, bills)? 1  Do you frequently interrupt others or talk over them? 1  Do you have poor self-control? 1  Do you blurt out thoughts that are rude or inappropriate without thinking? 1  Do you act recklessly or spontaneously without regard for consequences? 1  Do you have trouble behaving in socially appropriate ways  (such as sitting still during a long meeting)? 1  Are you easily flustered and stressed out? 1  Are you hypersensitive to criticism? 1  Do you have a short, often explosive, temper? 1  Do you experience low self-esteem and a sense of insecurity? 1  Do you have trouble sitting still - constant fidgeting? 1  Can you talk excessively? 1             Edema: Patient complains of edema. The location of the edema is generalized.  Onset of symptoms was several months ago, gradually worsening since that time. The edema is present all day. The patient states the problem is long-standing.  The swelling has been aggravated by nothing, relieved by OTC Medications, and been associated with nothing.        Allergies  Allergen Reactions  . Codeine Hives  . Erythromycin   . Hydrocodone-Acetaminophen Hives  . Lansoprazole   . Levofloxacin   . Morphine Sulfate   . Migraine Formula  [Aspirin-Acetaminophen-Caffeine]   . Omeprazole   .  Pepcid  [Famotidine]   . Sulfa Antibiotics    Previous Medications   ALBUTEROL (PROVENTIL HFA;VENTOLIN HFA) 108 (90 BASE) MCG/ACT INHALER    Inhale 2 puffs into the lungs every 4 (four) hours as needed for wheezing or shortness of breath.   ALPRAZOLAM (XANAX) 1 MG TABLET    TAKE 1 TABLET BY MOUTH TWICE A DAY   AMPHETAMINE-DEXTROAMPHETAMINE (ADDERALL) 30 MG TABLET    Take 1 tablet by mouth 2 (two) times daily as needed.   ASCORBIC ACID (VITAMIN C) 500 MG TABLET    Take 1 tablet by mouth daily.   DOXYCYCLINE (MONODOX) 100 MG CAPSULE    Take 1 capsule (100 mg total) by  mouth 2 (two) times daily.   ESOMEPRAZOLE (NEXIUM) 40 MG CAPSULE    Take 1 capsule by mouth daily.   IBUPROFEN (ADVIL,MOTRIN) 200 MG TABLET    Take 4 tablets by mouth as needed.   MECLIZINE (ANTIVERT) 25 MG TABLET    Take 1 tablet (25 mg total) by mouth 3 (three) times daily as needed for dizziness.   MOMETASONE (NASONEX) 50 MCG/ACT NASAL SPRAY    Place 2 sprays into the nose daily.   MULTIPLE VITAMIN PO    Take 1 tablet by mouth daily.   OXYCODONE-ACETAMINOPHEN (ENDOCET) 7.5-325 MG TABLET    Take 1 tablet by mouth 2 (two) times daily.    Review of Systems  Constitutional: Negative for fever, chills, diaphoresis, activity change, appetite change, fatigue and unexpected weight change.  Respiratory: Positive for shortness of breath. Negative for apnea, cough, choking, chest tightness, wheezing and stridor.        History of Asthma   Cardiovascular: Positive for leg swelling. Negative for chest pain and palpitations.  Gastrointestinal: Negative for abdominal pain and bowel incontinence.  Genitourinary: Negative for bladder incontinence and dysuria.  Musculoskeletal: Positive for back pain.  Neurological: Positive for weakness, numbness (Chronic issue only occasionally) and headaches. Negative for tingling and paresthesias.  Psychiatric/Behavioral: Negative for suicidal ideas, sleep disturbance and self-injury.    Social History  Substance Use Topics  . Smoking status: Current Every Day Smoker  . Smokeless tobacco: Not on file     Comment: "Im down to one a day"  . Alcohol Use: No   Objective:   BP 112/68 mmHg  Pulse 60  Temp(Src) 98.4 F (36.9 C) (Oral)  Resp 16  Wt 219 lb (99.338 kg)  Physical Exam  Constitutional: She is oriented to person, place, and time. She appears well-developed and well-nourished.  Cardiovascular: Normal rate, regular rhythm and normal heart sounds.   Pulmonary/Chest: Effort normal and breath sounds normal.  Musculoskeletal: She exhibits edema.    Neurological: She is alert and oriented to person, place, and time.  Skin: Skin is warm and dry.  Psychiatric: She has a normal mood and affect. Her behavior is normal. Judgment and thought content normal.       Assessment & Plan:      1. Arthralgia Stable; pt report pain is in good control on current medications.  Refill provided. Urine screening today.   - oxyCODONE-acetaminophen (ENDOCET) 7.5-325 MG tablet; Take 1 tablet by mouth 2 (two) times daily. May refill on 09/06/2015  Dispense: 60 tablet; Refill: 0  2. ADD (attention deficit disorder) Stable; Refills provided.  - amphetamine-dextroamphetamine (ADDERALL) 30 MG tablet; Take 1 tablet by mouth 2 (two) times daily as needed. May refill on 09/06/2015  Dispense: 60 tablet; Refill: 0  3. Chronic low back pain Treat as  above.   - Pain Management Screening Profile (10S)  4. Edema, unspecified type Worsening will check labs and treat with HTCZ as below.  Advised pt to stop taking if she becomes dizzy.  - hydrochlorothiazide (HYDRODIURIL) 12.5 MG tablet; Take 1 tablet (12.5 mg total) by mouth daily.  Dispense: 90 tablet; Refill: 1 - Comprehensive metabolic panel - CBC with Differential/Platelet  5. Big thyroid Will check labs.  - TSH  6. Hypercholesteremia Will recheck labs.  - Lipid panel   -Follow up with Tawanna Sat in about three months.  Pt is due to have a Mammogram and Colonoscopy; she states that she wants to defer this for a few months.   Patient was seen and examined by Jerrell Belfast, MD, and note scribed by Ashley Royalty, CMA.   I have reviewed the document for accuracy and completeness and I agree with above. - Jerrell Belfast, MD      Margarita Rana, MD  [facility]

## 2015-07-08 ENCOUNTER — Other Ambulatory Visit: Payer: Self-pay | Admitting: Family Medicine

## 2015-07-08 DIAGNOSIS — K219 Gastro-esophageal reflux disease without esophagitis: Secondary | ICD-10-CM

## 2015-07-09 LAB — PMP SCREEN PROFILE (10S), URINE
AMPHETAMINE SCRN UR: NEGATIVE ng/mL
Barbiturate Screen, Ur: NEGATIVE ng/mL
Benzodiazepine Screen, Urine: POSITIVE ng/mL
CANNABINOIDS UR QL SCN: NEGATIVE ng/mL
COCAINE(METAB.) SCREEN, URINE: NEGATIVE ng/mL
Creatinine(Crt), U: 256.3 mg/dL (ref 20.0–300.0)
Methadone Scn, Ur: NEGATIVE ng/mL
OPIATE SCRN UR: NEGATIVE ng/mL
OXYCODONE+OXYMORPHONE UR QL SCN: POSITIVE ng/mL
PCP SCRN UR: NEGATIVE ng/mL
PROPOXYPHENE SCREEN: NEGATIVE ng/mL
Ph of Urine: 6.5 (ref 4.5–8.9)

## 2015-07-10 ENCOUNTER — Telehealth: Payer: Self-pay

## 2015-07-10 NOTE — Telephone Encounter (Signed)
-----   Message from Margarita Rana, MD sent at 07/09/2015  2:36 PM EDT ----- Tox screen as expected. Thanks.

## 2015-07-10 NOTE — Telephone Encounter (Signed)
Advised pt of lab results. Pt verbally acknowledges understanding. Emily Drozdowski, CMA   

## 2015-07-14 NOTE — Telephone Encounter (Signed)
Done

## 2015-07-25 ENCOUNTER — Emergency Department
Admission: EM | Admit: 2015-07-25 | Discharge: 2015-07-25 | Disposition: A | Discharge status: Home / Self Care | Payer: Medicare Other | Attending: Student | Admitting: Student

## 2015-07-25 DIAGNOSIS — J029 Acute pharyngitis, unspecified: Secondary | ICD-10-CM | POA: Diagnosis present

## 2015-07-25 DIAGNOSIS — Z79899 Other long term (current) drug therapy: Secondary | ICD-10-CM | POA: Insufficient documentation

## 2015-07-25 DIAGNOSIS — F17210 Nicotine dependence, cigarettes, uncomplicated: Secondary | ICD-10-CM | POA: Insufficient documentation

## 2015-07-25 DIAGNOSIS — Z791 Long term (current) use of non-steroidal anti-inflammatories (NSAID): Secondary | ICD-10-CM | POA: Insufficient documentation

## 2015-07-25 DIAGNOSIS — K1121 Acute sialoadenitis: Secondary | ICD-10-CM | POA: Diagnosis not present

## 2015-07-25 DIAGNOSIS — J45909 Unspecified asthma, uncomplicated: Secondary | ICD-10-CM | POA: Insufficient documentation

## 2015-07-25 DIAGNOSIS — J449 Chronic obstructive pulmonary disease, unspecified: Secondary | ICD-10-CM | POA: Diagnosis not present

## 2015-07-25 DIAGNOSIS — K1120 Sialoadenitis, unspecified: Secondary | ICD-10-CM | POA: Diagnosis not present

## 2015-07-25 HISTORY — DX: Chronic obstructive pulmonary disease, unspecified: J44.9

## 2015-07-25 HISTORY — DX: Unspecified asthma, uncomplicated: J45.909

## 2015-07-25 MED ORDER — CEPHALEXIN 500 MG PO CAPS
500.0000 mg | ORAL_CAPSULE | Freq: Once | ORAL | Status: AC
  Administered 2015-07-25: 500 mg via ORAL
  Filled 2015-07-25: qty 1

## 2015-07-25 MED ORDER — CEPHALEXIN 500 MG PO CAPS: 500.0000 mg | ORAL_CAPSULE | Freq: Four times a day (QID) | ORAL | Status: DC

## 2015-07-25 NOTE — Discharge Instructions (Signed)
Salivary Gland Infection A salivary gland infection is an infection in one or more of the glands that produce spit (saliva). You have six major salivary glands. Each gland has a duct that carries saliva into your mouth. Saliva keeps your mouth moist and breaks down the food that you eat. It also helps to prevent tooth decay. Two salivary glands are located just in front of your ears (parotid). The ducts for these glands open up inside your cheeks, near your back teeth. You also have two glands under your tongue (sublingual) and two glands under your jaw (submandibular). The ducts for these glands open under your tongue. Any salivary gland can become infected. Most infections occur in the parotid glands or submandibular glands. CAUSES Salivary glands can be infected by viruses or bacteria.  The mumps virus is the most common cause of viral salivary gland infections, though mumps is now rare in many areas because of vaccination.  This infection causes swelling in both parotid glands.  Viral infections are more common in children.  The bacteria that cause salivary gland infections are usually the same bacteria that normally live in your mouth.  A stone can form in a salivary gland and block the flow of saliva. As a result, saliva backs up into the salivary gland. Bacteria may then start to grow behind the blockage and cause infection.  Bacterial infections usually cause pain and swelling on one side of the face. Submandibular gland swelling occurs under the jaw. Parotid swelling occurs in front of the ear.  Bacterial infections are more common in adults. RISK FACTORS Children who do not get the MMR (measles, mumps, rubella) vaccine are more likely to get mumps, which can cause a viral salivary gland infection. Risk factors for bacterial infections include:  Poor dental care (oral hygiene).  Smoking.  Not drinking enough water.  Having a disease that causes dry mouth and dry eyes (Mikulicz  syndrome or Sjogren syndrome). SIGNS AND SYMPTOMS The main sign of salivary gland infection is a swollen salivary gland. This type of inflammation is often called sialadenitis. You may have swelling in front of your ear, under your jaw, or under your tongue. Swelling may get worse when you eat and decrease after you eat. Other signs and symptoms include:  Pain.  Tenderness.  Redness.  Dry mouth.  Bad taste in your mouth.  Difficulty chewing and swallowing.  Fever. DIAGNOSIS Your health care provider may suspect a salivary gland infection based on your signs and symptoms. He or she will also do a physical exam. The health care provider will look and feel inside your mouth to see whether a stone is blocking a salivary gland duct. You may need to see an ear, nose, and throat specialist (ENT or otolaryngologist) for diagnosis and treatment. You may also need to have diagnostic tests, such as:  An X-ray to check for a stone.  Other imaging studies to look for an abscess and to rule out other causes of swelling. These tests may include:  Ultrasound.  CT scan.  MRI.  Culture and sensitivity test. This involves collecting a sample of pus for testing in the lab to see what bacteria grow and what antibiotics they are sensitive to. The testing sample may be:  Swabbed from a salivary gland duct.  Withdrawn from a swollen gland with a needle (aspiration). TREATMENT Viral salivary gland infections usually clear up without treatment. Bacterial infections are usually treated with antibiotic medicine. Severe infections that cause difficulty with swallowing may  be treated with an IV antibiotic in the hospital. Other treatments may include:  Probing and widening the salivary duct to allow a stone to pass. In some cases, a thin, flexible scope (endoscope) may be inserted into the duct to find a stone and remove it.  Breaking up a stone using sound waves.  Draining an infected gland (abscess)  with a needle.  In some cases, you may need surgery so your health care provider can:  Remove a stone.  Drain pus from an abscess.  Remove a badly infected gland. HOME CARE INSTRUCTIONS  Take medicines only as directed by your health care provider.  If you were prescribed an antibiotic medicine, finish it all even if you start to feel better.  Follow these instructions every few hours:  Suck on a lemon candy to stimulate the flow of saliva.  Put a warm compress over the gland.  Gently massage the gland.  Drink enough fluid to keep your urine clear or pale yellow.  Rinse your mouth with a mixture of warm water and salt every few hours. To make this mixture, add a pinch of salt to 1 cup of warm water.  Practice good oral hygiene by brushing and flossing your teeth after meals and before you go to bed.  Do not use any tobacco products, including cigarettes, chewing tobacco, or electronic cigarettes. If you need help quitting, ask your health care provider. SEEK MEDICAL CARE IF:  You have pain and swelling in your face, jaw, or mouth after eating.  You have persistent swelling in any of these places:  In front of your ear.  Under your jaw.  Inside your mouth. SEEK IMMEDIATE MEDICAL CARE IF:   You have pain and swelling in your face, jaw, or mouth that are getting worse.  Your pain and swelling make it hard to swallow or breathe.   This information is not intended to replace advice given to you by your health care provider. Make sure you discuss any questions you have with your health care provider.   Document Released: 04/04/2004 Document Revised: 03/18/2014 Document Reviewed: 07/28/2013 Elsevier Interactive Patient Education [employer].

## 2015-07-25 NOTE — ED Provider Notes (Signed)
CSN: [id number]     Arrival date & time 07/25/15  1842 History   First MD Initiated Contact with Patient 07/25/15 1932     Chief Complaint  Patient presents with  . Sore Throat  . Otalgia     (Consider location/radiation/quality/duration/timing/severity/associated sxs/prior Treatment) HPI  50 year old female presents to emergency department for evaluation of right jaw pain. She points to the middle of the right jaw along the submandibular glands that she's had pain and swelling for the last week. This is increased. She denies any cough or cold symptoms. No fevers. She's had some swelling with dry mouth. She denies any history of sialadenitis. No trauma or injury. She does admit to some TMJ pain. Denies any sharp shooting pain or burning sensation throughout the face or trigeminal nerve. She has not taken any medications for pain.  Past Medical History  Diagnosis Date  . Asthma   . COPD (chronic obstructive pulmonary disease) [facility])    Past Surgical History  Procedure Laterality Date  . Tubal ligation  1999  . Cesarean section      X 2  . Breast reduction surgery Bilateral 08/1991  . Ablation  2011   Family History  Problem Relation Age of Onset  . Ulcers Mother   . Colon polyps Mother   . Lung cancer Father   . Ulcers Father   . Cirrhosis Father   . Colon polyps Maternal Aunt   . Aneurysm Maternal Grandmother   . Throat cancer Maternal Grandfather    Social History  Substance Use Topics  . Smoking status: Current Every Day Smoker    Types: Cigarettes  . Smokeless tobacco: None     Comment: "Im down to one a day"  . Alcohol Use: No   OB History    No data available     Review of Systems  Constitutional: Negative for fever, chills, activity change and fatigue.  HENT: Positive for facial swelling. Negative for congestion, sinus pressure and sore throat.   Eyes: Negative for visual disturbance.  Respiratory: Negative for cough, chest tightness and shortness of breath.    Cardiovascular: Negative for chest pain and leg swelling.  Gastrointestinal: Negative for nausea, vomiting, abdominal pain and diarrhea.  Genitourinary: Negative for dysuria.  Musculoskeletal: Negative for arthralgias and gait problem.  Skin: Negative for rash.  Neurological: Negative for weakness, numbness and headaches.  Hematological: Negative for adenopathy.  Psychiatric/Behavioral: Negative for behavioral problems, confusion and agitation.      Allergies  Codeine; Erythromycin; Hydrocodone-acetaminophen; Lansoprazole; Levofloxacin; Morphine sulfate; Migraine formula ; Omeprazole; Pepcid ; and Sulfa antibiotics  Home Medications   Prior to Admission medications   Medication Sig Start Date End Date Taking? Authorizing Provider  albuterol (PROVENTIL HFA;VENTOLIN HFA) 108 (90 BASE) MCG/ACT inhaler Inhale 2 puffs into the lungs every 4 (four) hours as needed for wheezing or shortness of breath. 11/08/14   Margarita Rana, MD  ALPRAZolam Duanne Moron) 1 MG tablet TAKE 1 TABLET BY MOUTH TWICE A DAY 01/19/15   Margarita Rana, MD  amphetamine-dextroamphetamine (ADDERALL) 30 MG tablet Take 1 tablet by mouth 2 (two) times daily as needed. May refill on 09/06/2015 07/07/15   Margarita Rana, MD  ascorbic acid (VITAMIN C) 500 MG tablet Take 1 tablet by mouth daily.    Historical Provider, MD  cephALEXin (KEFLEX) 500 MG capsule Take 1 capsule (500 mg total) by mouth 4 (four) times daily. 07/25/15   Duanne Guess, PA-C  doxycycline (MONODOX) 100 MG capsule Take 1 capsule (100  mg total) by mouth 2 (two) times daily. 05/17/15   Mar Daring, PA-C  hydrochlorothiazide (HYDRODIURIL) 12.5 MG tablet Take 1 tablet (12.5 mg total) by mouth daily. 07/07/15   Margarita Rana, MD  ibuprofen (ADVIL,MOTRIN) 200 MG tablet Take 4 tablets by mouth as needed.    Historical Provider, MD  meclizine (ANTIVERT) 25 MG tablet Take 1 tablet (25 mg total) by mouth 3 (three) times daily as needed for dizziness. 03/17/15   Mar Daring, PA-C  mometasone (NASONEX) 50 MCG/ACT nasal spray Place 2 sprays into the nose daily. 11/08/14   Margarita Rana, MD  MULTIPLE VITAMIN PO Take 1 tablet by mouth daily.    Historical Provider, MD  NEXIUM 40 MG capsule TAKE ONE CAPSULE BY MOUTH EVERY DAY 07/08/15   Margarita Rana, MD  oxyCODONE-acetaminophen (ENDOCET) 7.5-325 MG tablet Take 1 tablet by mouth 2 (two) times daily. May refill on 09/06/2015 07/07/15   Margarita Rana, MD   BP 118/65 mmHg  Pulse 68  Temp(Src) 98.2 F (36.8 C) (Oral)  Resp 17  Ht 5\' 1"  (1.549 m)  Wt 90.719 kg  BMI 37.81 kg/m2  SpO2 99% Physical Exam  Constitutional: She is oriented to person, place, and time. She appears well-developed and well-nourished. No distress.  HENT:  Head: Normocephalic and atraumatic.  Right Ear: Hearing, tympanic membrane, external ear and ear canal normal.  Left Ear: Hearing, tympanic membrane, external ear and ear canal normal.  Nose: No rhinorrhea.  Mouth/Throat: Oropharynx is clear and moist. No trismus in the jaw. No uvula swelling. No oropharyngeal exudate, posterior oropharyngeal edema, posterior oropharyngeal erythema or tonsillar abscesses.  Examination of the right jaw shows submandibular gland swelling. No parotid gland swelling. There is no fluctuants no warmth or erythema. No drainage intraorally. No palpable stone. Patient is tender to palpation along the submandibular gland on the right side.  Eyes: EOM are normal. Pupils are equal, round, and reactive to light. Right eye exhibits no discharge. Left eye exhibits no discharge.  Neck: Normal range of motion. Neck supple.  Cardiovascular: Normal rate, regular rhythm and intact distal pulses.   Pulmonary/Chest: Effort normal and breath sounds normal. No respiratory distress. She exhibits no tenderness.  Musculoskeletal: Normal range of motion. She exhibits no edema.  Neurological: She is alert and oriented to person, place, and time. She has normal reflexes.  Skin: Skin  is warm and dry.  Psychiatric: She has a normal mood and affect. Her behavior is normal. Thought content normal.    ED Course  Procedures (including critical care time) Labs Review Labs Reviewed - No data to display  Imaging Review No results found. I have personally reviewed and evaluated these images and lab results as part of my medical decision-making.   EKG Interpretation None      MDM   Final diagnoses:  Sialadenitis  Submandibular gland infection    51 year old female with right submandibular last swelling. She is afebrile no sign of fluctuance. She is started on Keflex 500 mg 1 tab by mouth every 6 hours 7 days. She will use oral sour candy to help promote salivation. Ibuprofen as needed for pain. She is educated on red flags to return to the ED for such as increased pain and swelling warmth redness or fevers.    Duanne Guess, PA-C 07/25/15 2001  Joanne Gavel, MD 07/25/15 [phone]

## 2015-07-25 NOTE — ED Notes (Signed)
Pt c/o BL ear pain with sore throat and and jaw pain with swollen cervical nodes.

## 2015-07-25 NOTE — ED Notes (Signed)
Pt wishing to wait in room before leaving to make sure no allergic reaction to antibiotic

## 2015-07-26 ENCOUNTER — Other Ambulatory Visit: Payer: Self-pay | Admitting: Family Medicine

## 2015-07-26 DIAGNOSIS — F419 Anxiety disorder, unspecified: Secondary | ICD-10-CM

## 2015-07-26 NOTE — Telephone Encounter (Signed)
Printed, please fax or call in to pharmacy. Thank you.   

## 2015-07-27 ENCOUNTER — Other Ambulatory Visit: Payer: Self-pay | Admitting: Family Medicine

## 2015-07-27 DIAGNOSIS — F419 Anxiety disorder, unspecified: Secondary | ICD-10-CM

## 2015-07-27 NOTE — Telephone Encounter (Signed)
Printed, please fax or call in to pharmacy. Thank you.   

## 2015-10-06 ENCOUNTER — Ambulatory Visit (INDEPENDENT_AMBULATORY_CARE_PROVIDER_SITE_OTHER): Payer: Medicare Other | Admitting: Physician Assistant

## 2015-10-06 ENCOUNTER — Encounter: Payer: Self-pay | Admitting: Physician Assistant

## 2015-10-06 VITALS — BP 112/70 | HR 68 | Temp 98.2°F | Ht 62.75 in

## 2015-10-06 DIAGNOSIS — H543 Unqualified visual loss, both eyes: Secondary | ICD-10-CM

## 2015-10-06 DIAGNOSIS — M2550 Pain in unspecified joint: Secondary | ICD-10-CM | POA: Diagnosis not present

## 2015-10-06 DIAGNOSIS — M419 Scoliosis, unspecified: Secondary | ICD-10-CM

## 2015-10-06 DIAGNOSIS — Z0000 Encounter for general adult medical examination without abnormal findings: Secondary | ICD-10-CM

## 2015-10-06 DIAGNOSIS — F988 Other specified behavioral and emotional disorders with onset usually occurring in childhood and adolescence: Secondary | ICD-10-CM

## 2015-10-06 DIAGNOSIS — M7070 Other bursitis of hip, unspecified hip: Secondary | ICD-10-CM

## 2015-10-06 DIAGNOSIS — H542 Low vision, both eyes: Secondary | ICD-10-CM

## 2015-10-06 DIAGNOSIS — M778 Other enthesopathies, not elsewhere classified: Secondary | ICD-10-CM

## 2015-10-06 DIAGNOSIS — F909 Attention-deficit hyperactivity disorder, unspecified type: Secondary | ICD-10-CM | POA: Diagnosis not present

## 2015-10-06 MED ORDER — OXYCODONE-ACETAMINOPHEN 7.5-325 MG PO TABS: 1.0000 | ORAL_TABLET | Freq: Two times a day (BID) | ORAL | 0 refills | Status: DC

## 2015-10-06 MED ORDER — AMPHETAMINE-DEXTROAMPHETAMINE 30 MG PO TABS: 1.0000 | ORAL_TABLET | Freq: Two times a day (BID) | ORAL | 0 refills | Status: DC | PRN

## 2015-10-06 MED ORDER — OXYCODONE-ACETAMINOPHEN 7.5-325 MG PO TABS
1.0000 | ORAL_TABLET | Freq: Two times a day (BID) | ORAL | 0 refills | Status: DC
Start: 2015-10-06 — End: 2015-10-06

## 2015-10-06 NOTE — Patient Instructions (Signed)
Health Maintenance, Female Adopting a healthy lifestyle and getting preventive care can go a long way to promote health and wellness. Talk with your health care provider about what schedule of regular examinations is right for you. This is a good chance for you to check in with your provider about disease prevention and staying healthy. In between checkups, there are plenty of things you can do on your own. Experts have done a lot of research about which lifestyle changes and preventive measures are most likely to keep you healthy. Ask your health care provider for more information. WEIGHT AND DIET  Eat a healthy diet  Be sure to include plenty of vegetables, fruits, low-fat dairy products, and lean protein.  Do not eat a lot of foods high in solid fats, added sugars, or salt.  Get regular exercise. This is one of the most important things you can do for your health.  Most adults should exercise for at least 150 minutes each week. The exercise should increase your heart rate and make you sweat (moderate-intensity exercise).  Most adults should also do strengthening exercises at least twice a week. This is in addition to the moderate-intensity exercise.  Maintain a healthy weight  Body mass index (BMI) is a measurement that can be used to identify possible weight problems. It estimates body fat based on height and weight. Your health care provider can help determine your BMI and help you achieve or maintain a healthy weight.  For females 20 years of age and older:   A BMI below 18.5 is considered underweight.  A BMI of 18.5 to 24.9 is normal.  A BMI of 25 to 29.9 is considered overweight.  A BMI of 30 and above is considered obese.  Watch levels of cholesterol and blood lipids  You should start having your blood tested for lipids and cholesterol at 50 years of age, then have this test every 5 years.  You may need to have your cholesterol levels checked more often if:  Your lipid  or cholesterol levels are high.  You are older than 50 years of age.  You are at high risk for heart disease.  CANCER SCREENING   Lung Cancer  Lung cancer screening is recommended for adults 55-80 years old who are at high risk for lung cancer because of a history of smoking.  A yearly low-dose CT scan of the lungs is recommended for people who:  Currently smoke.  Have quit within the past 15 years.  Have at least a 30-pack-year history of smoking. A pack year is smoking an average of one pack of cigarettes a day for 1 year.  Yearly screening should continue until it has been 15 years since you quit.  Yearly screening should stop if you develop a health problem that would prevent you from having lung cancer treatment.  Breast Cancer  Practice breast self-awareness. This means understanding how your breasts normally appear and feel.  It also means doing regular breast self-exams. Let your health care provider know about any changes, no matter how small.  If you are in your 20s or 30s, you should have a clinical breast exam (CBE) by a health care provider every 1-3 years as part of a regular health exam.  If you are 40 or older, have a CBE every year. Also consider having a breast X-ray (mammogram) every year.  If you have a family history of breast cancer, talk to your health care provider about genetic screening.  If you   are at high risk for breast cancer, talk to your health care provider about having an MRI and a mammogram every year.  Breast cancer gene (BRCA) assessment is recommended for women who have family members with BRCA-related cancers. BRCA-related cancers include:  Breast.  Ovarian.  Tubal.  Peritoneal cancers.  Results of the assessment will determine the need for genetic counseling and BRCA1 and BRCA2 testing. Cervical Cancer Your health care provider may recommend that you be screened regularly for cancer of the pelvic organs (ovaries, uterus, and  vagina). This screening involves a pelvic examination, including checking for microscopic changes to the surface of your cervix (Pap test). You may be encouraged to have this screening done every 3 years, beginning at age 21.  For women ages 30-65, health care providers may recommend pelvic exams and Pap testing every 3 years, or they may recommend the Pap and pelvic exam, combined with testing for human papilloma virus (HPV), every 5 years. Some types of HPV increase your risk of cervical cancer. Testing for HPV may also be done on women of any age with unclear Pap test results.  Other health care providers may not recommend any screening for nonpregnant women who are considered low risk for pelvic cancer and who do not have symptoms. Ask your health care provider if a screening pelvic exam is right for you.  If you have had past treatment for cervical cancer or a condition that could lead to cancer, you need Pap tests and screening for cancer for at least 20 years after your treatment. If Pap tests have been discontinued, your risk factors (such as having a new sexual partner) need to be reassessed to determine if screening should resume. Some women have medical problems that increase the chance of getting cervical cancer. In these cases, your health care provider may recommend more frequent screening and Pap tests. Colorectal Cancer  This type of cancer can be detected and often prevented.  Routine colorectal cancer screening usually begins at 50 years of age and continues through 50 years of age.  Your health care provider may recommend screening at an earlier age if you have risk factors for colon cancer.  Your health care provider may also recommend using home test kits to check for hidden blood in the stool.  A small camera at the end of a tube can be used to examine your colon directly (sigmoidoscopy or colonoscopy). This is done to check for the earliest forms of colorectal  cancer.  Routine screening usually begins at age 50.  Direct examination of the colon should be repeated every 5-10 years through 50 years of age. However, you may need to be screened more often if early forms of precancerous polyps or small growths are found. Skin Cancer  Check your skin from head to toe regularly.  Tell your health care provider about any new moles or changes in moles, especially if there is a change in a mole's shape or color.  Also tell your health care provider if you have a mole that is larger than the size of a pencil eraser.  Always use sunscreen. Apply sunscreen liberally and repeatedly throughout the day.  Protect yourself by wearing long sleeves, pants, a wide-brimmed hat, and sunglasses whenever you are outside. HEART DISEASE, DIABETES, AND HIGH BLOOD PRESSURE   High blood pressure causes heart disease and increases the risk of stroke. High blood pressure is more likely to develop in:  People who have blood pressure in the high end   of the normal range (130-139/85-89 mm Hg).  People who are overweight or obese.  People who are African American.  If you are 38-23 years of age, have your blood pressure checked every 3-5 years. If you are 61 years of age or older, have your blood pressure checked every year. You should have your blood pressure measured twice--once when you are at a hospital or clinic, and once when you are not at a hospital or clinic. Record the average of the two measurements. To check your blood pressure when you are not at a hospital or clinic, you can use:  An automated blood pressure machine at a pharmacy.  A home blood pressure monitor.  If you are between 45 years and 39 years old, ask your health care provider if you should take aspirin to prevent strokes.  Have regular diabetes screenings. This involves taking a blood sample to check your fasting blood sugar level.  If you are at a normal weight and have a low risk for diabetes,  have this test once every three years after 50 years of age.  If you are overweight and have a high risk for diabetes, consider being tested at a younger age or more often. PREVENTING INFECTION  Hepatitis B  If you have a higher risk for hepatitis B, you should be screened for this virus. You are considered at high risk for hepatitis B if:  You were born in a country where hepatitis B is common. Ask your health care provider which countries are considered high risk.  Your parents were born in a high-risk country, and you have not been immunized against hepatitis B (hepatitis B vaccine).  You have HIV or AIDS.  You use needles to inject street drugs.  You live with someone who has hepatitis B.  You have had sex with someone who has hepatitis B.  You get hemodialysis treatment.  You take certain medicines for conditions, including cancer, organ transplantation, and autoimmune conditions. Hepatitis C  Blood testing is recommended for:  Everyone born from [birth year] through 1965.  Anyone with known risk factors for hepatitis C. Sexually transmitted infections (STIs)  You should be screened for sexually transmitted infections (STIs) including gonorrhea and chlamydia if:  You are sexually active and are younger than 50 years of age.  You are older than 50 years of age and your health care provider tells you that you are at risk for this type of infection.  Your sexual activity has changed since you were last screened and you are at an increased risk for chlamydia or gonorrhea. Ask your health care provider if you are at risk.  If you do not have HIV, but are at risk, it may be recommended that you take a prescription medicine daily to prevent HIV infection. This is called pre-exposure prophylaxis (PrEP). You are considered at risk if:  You are sexually active and do not regularly use condoms or know the HIV status of your partner(s).  You take drugs by injection.  You are sexually  active with a partner who has HIV. Talk with your health care provider about whether you are at high risk of being infected with HIV. If you choose to begin PrEP, you should first be tested for HIV. You should then be tested every 3 months for as long as you are taking PrEP.  PREGNANCY   If you are premenopausal and you may become pregnant, ask your health care provider about preconception counseling.  If you may  become pregnant, take 400 to 800 micrograms (mcg) of folic acid every day.  If you want to prevent pregnancy, talk to your health care provider about birth control (contraception). OSTEOPOROSIS AND MENOPAUSE   Osteoporosis is a disease in which the bones lose minerals and strength with aging. This can result in serious bone fractures. Your risk for osteoporosis can be identified using a bone density scan.  If you are 61 years of age or older, or if you are at risk for osteoporosis and fractures, ask your health care provider if you should be screened.  Ask your health care provider whether you should take a calcium or vitamin D supplement to lower your risk for osteoporosis.  Menopause may have certain physical symptoms and risks.  Hormone replacement therapy may reduce some of these symptoms and risks. Talk to your health care provider about whether hormone replacement therapy is right for you.  HOME CARE INSTRUCTIONS   Schedule regular health, dental, and eye exams.  Stay current with your immunizations.   Do not use any tobacco products including cigarettes, chewing tobacco, or electronic cigarettes.  If you are pregnant, do not drink alcohol.  If you are breastfeeding, limit how much and how often you drink alcohol.  Limit alcohol intake to no more than 1 drink per day for nonpregnant women. One drink equals 12 ounces of beer, 5 ounces of wine, or 1 ounces of hard liquor.  Do not use street drugs.  Do not share needles.  Ask your health care provider for help if  you need support or information about quitting drugs.  Tell your health care provider if you often feel depressed.  Tell your health care provider if you have ever been abused or do not feel safe at home.   This information is not intended to replace advice given to you by your health care provider. Make sure you discuss any questions you have with your health care provider.   Document Released: 09/10/2010 Document Revised: 03/18/2014 Document Reviewed: 01/27/2013 Elsevier Interactive Patient Education [employer].

## 2015-10-06 NOTE — Progress Notes (Signed)
Patient: Kathleen Buchanan, Female    DOB: 03-25-1965, 50 y.o.   MRN: [id number] Visit Date: 10/06/2015  Today's Provider: Mar Daring, PA-C   Chief Complaint  Patient presents with  . Medicare Wellness   Subjective:    Annual wellness visit Kathleen Buchanan is a 50 y.o. female. She feels well. She reports exercising none she reports that walking makes her knee and ankle hurts. She reports she is sleeping fairly well. Having trouble with left hip, left knee, and right ankle. These are chronic issues that she has been followed for a long time. She feels they are progressing.  -----------------------------------------------------------  Review of Systems  Constitutional: Positive for activity change.  HENT: Negative.   Eyes: Negative.   Respiratory: Negative.   Cardiovascular: Negative.   Gastrointestinal: Positive for constipation.  Endocrine: Negative.   Genitourinary: Negative.   Musculoskeletal: Positive for arthralgias, joint swelling and myalgias.  Skin: Negative.   Allergic/Immunologic: Negative.   Neurological: Negative.   Hematological: Negative.   Psychiatric/Behavioral: Negative.     Social History   Social History  . Marital status: Single    Spouse name: N/A  . Number of children: N/A  . Years of education: N/A   Occupational History  . Not on file.   Social History Main Topics  . Smoking status: Current Every Day Smoker    Types: Cigarettes  . Smokeless tobacco: Never Used     Comment: "Im down to one a day"  . Alcohol use No  . Drug use: No  . Sexual activity: Not on file   Other Topics Concern  . Not on file   Social History Narrative  . No narrative on file    Past Medical History:  Diagnosis Date  . Asthma   . COPD (chronic obstructive pulmonary disease) [facility])      Patient Active Problem List   Diagnosis Date Noted  . Chronic low back pain 11/08/2014  . Onychomycosis 09/15/2014  . ADD (attention deficit disorder)  09/08/2014  . Ankle pain 09/08/2014  . Ache in joint 09/08/2014  . Anxiety 09/08/2014  . Airway hyperreactivity 09/08/2014  . Ankle fracture 09/08/2014  . Back ache 09/08/2014  . Bursitis of hip 09/08/2014  . Chronic anemia 09/08/2014  . Chronic constipation 09/08/2014  . Chronic LBP 09/08/2014  . CAFL (chronic airflow limitation) ([place]) 09/08/2014  . Accumulation of fluid in tissues 09/08/2014  . Biliary sludge 09/08/2014  . Acid reflux 09/08/2014  . History of migraine headaches 09/08/2014  . Hypercholesteremia 09/08/2014  . Adiposity 09/08/2014  . Episodic paroxysmal anxiety disorder 09/08/2014  . Scoliosis 09/08/2014  . Tendinitis of wrist 09/08/2014  . Big thyroid 09/08/2014  . Current tobacco use 09/08/2014    Past Surgical History:  Procedure Laterality Date  . ABLATION  2011  . BREAST REDUCTION SURGERY Bilateral 08/1991  . CESAREAN SECTION     X 2  . TUBAL LIGATION  1999    Her family history includes Aneurysm in her maternal grandmother; Cirrhosis in her father; Colon polyps in her maternal aunt and mother; Lung cancer in her father; Throat cancer in her maternal grandfather; Ulcers in her father and mother.    No outpatient prescriptions have been marked as taking for the 10/06/15 encounter (Office Visit) with Mar Daring, PA-C.    Patient Care Team: Margarita Rana, MD as PCP - General (Family Medicine) Seeplaputhur Robinette Haines, MD (General Surgery)    Objective:   Vitals:  BP 112/70 (BP Location: Left Arm, Patient Position: Sitting, Cuff Size: Large)   Pulse 68   Temp 98.2 F (36.8 C) (Oral)   Ht 5' 2.75" (1.594 m)   Physical Exam  Constitutional: She is oriented to person, place, and time. She appears well-developed and well-nourished. No distress.  HENT:  Head: Normocephalic and atraumatic.  Right Ear: External ear normal.  Left Ear: External ear normal.  Nose: Nose normal.  Mouth/Throat: Oropharynx is clear and moist. No oropharyngeal exudate.    Eyes: Conjunctivae and EOM are normal. Pupils are equal, round, and reactive to light. Right eye exhibits no discharge. Left eye exhibits no discharge. No scleral icterus.  Neck: Normal range of motion. Neck supple. No JVD present. No tracheal deviation present. No thyromegaly present.  Cardiovascular: Normal rate, regular rhythm, normal heart sounds and intact distal pulses.  Exam reveals no gallop and no friction rub.   No murmur heard. Pulmonary/Chest: Effort normal and breath sounds normal. No respiratory distress. She has no wheezes. She has no rales. She exhibits no tenderness.  Abdominal: Soft. Bowel sounds are normal. She exhibits no distension and no mass. There is no tenderness. There is no rebound and no guarding.  Musculoskeletal: Normal range of motion. She exhibits no edema or tenderness.  Lymphadenopathy:    She has no cervical adenopathy.  Neurological: She is alert and oriented to person, place, and time.  Skin: Skin is warm and dry. No rash noted. She is not diaphoretic.  Psychiatric: She has a normal mood and affect. Her behavior is normal. Judgment and thought content normal.  Vitals reviewed.   Activities of Daily Living In your present state of health, do you have any difficulty performing the following activities: 10/06/2015  Hearing? N  Vision? Y  Difficulty concentrating or making decisions? N  Walking or climbing stairs? Y  Dressing or bathing? N  Doing errands, shopping? Y  Some recent data might be hidden    Fall Risk Assessment Fall Risk  10/06/2015  Falls in the past year? No     Depression Screen PHQ 2/9 Scores 10/06/2015  PHQ - 2 Score 0    Cognitive Testing - 6-CIT  Correct? Score   What year is it? yes 0 0 or 4  What month is it? yes 0 0 or 3  Memorize:    Pia Mau,  42,  [place],      What time is it? (within 1 hour) yes 0 0 or 3  Count backwards from 20 yes 0 0, 2, or 4  Name the months of the year yes 0 0, 2, or 4  Repeat  name & address above no 6 0, 2, 4, 6, 8, or 10       TOTAL SCORE  6/28   Interpretation:  Normal  Normal (0-7) Abnormal (8-28)   Audit-C Alcohol Use Screening  Question Answer Points  How often do you have alcoholic drink? never 0  On days you do drink alcohol, how many drinks do you typically consume? 0 0  How oftey will you drink 6 or more in a total? never 0  Total Score:  0   A score of 3 or more in women, and 4 or more in men indicates increased risk for alcohol abuse, EXCEPT if all of the points are from question 1.  Visual Acuity Screening   Right eye Left eye Both eyes  Without correction: 20/50 20/70 20/50   With correction:  Assessment & Plan:     Annual Wellness Visit  Reviewed patient's Family Medical History Reviewed and updated list of patient's medical providers Assessment of cognitive impairment was done Assessed patient's functional ability Established a written schedule for health screening [place] Completed and Reviewed  Exercise Activities and Dietary recommendations Goals    None       There is no immunization history on file for this patient.  Health Maintenance  Topic Date Due  . HIV Screening  05/02/1980  . TETANUS/TDAP  05/02/1984  . PAP SMEAR  05/02/1986  . MAMMOGRAM  05/03/2015  . COLONOSCOPY  05/03/2015  . INFLUENZA VACCINE  10/10/2015      Discussed health benefits of physical activity, and encouraged her to engage in regular exercise appropriate for her age and condition.   1. Annual physical exam Exam was unremarkable. Will have her return in one month as she is due for labs for chronic issues. She was to have had labs in 06/2015 but never returned to have them. Advised her to come fasting for labs. Discussed mammogram and colonoscopy. Colonoscopy she refuses at this time stating, "wait until next year." As for mammogram she states, "maybe in a couple of months."  2. Arthralgia Stable. Diagnosis pulled  for medication refill. Continue current medical treatment plan. Refills will be due 01/03/16. - oxyCODONE-acetaminophen (ENDOCET) 7.5-325 MG tablet; Take 1 tablet by mouth 2 (two) times daily. May refill on 12/05/15  Dispense: 60 tablet; Refill: 0  3. Tendinitis of wrist Stable. Diagnosis pulled for medication refill. Continue current medical treatment plan. - oxyCODONE-acetaminophen (ENDOCET) 7.5-325 MG tablet; Take 1 tablet by mouth 2 (two) times daily. May refill on 12/05/15  Dispense: 60 tablet; Refill: 0  4. Scoliosis Stable. Diagnosis pulled for medication refill. Continue current medical treatment plan. - oxyCODONE-acetaminophen (ENDOCET) 7.5-325 MG tablet; Take 1 tablet by mouth 2 (two) times daily. May refill on 12/05/15  Dispense: 60 tablet; Refill: 0  5. Bursitis of hip, unspecified laterality Worsening per patient. States pain med does not help completely. Discussed steroid injection into bursa but she refuses at this time. Will continue conservative management with stretches, ice and exercises.  - oxyCODONE-acetaminophen (ENDOCET) 7.5-325 MG tablet; Take 1 tablet by mouth 2 (two) times daily. May refill on 12/05/15  Dispense: 60 tablet; Refill: 0  6. ADD (attention deficit disorder) Stable. Diagnosis pulled for medication refill. Continue current medical treatment plan.  Refills will be due 01/03/16 - amphetamine-dextroamphetamine (ADDERALL) 30 MG tablet; Take 1 tablet by mouth 2 (two) times daily as needed. May refill on 12/05/15  Dispense: 60 tablet; Refill: 0  ------------------------------------------------------------------------------------------------------------    Mar Daring, PA-C  [facility]

## 2015-11-17 ENCOUNTER — Ambulatory Visit: Payer: Medicare Other | Admitting: Physician Assistant

## 2015-11-21 ENCOUNTER — Ambulatory Visit: Payer: Medicare Other | Admitting: Physician Assistant

## 2015-11-23 ENCOUNTER — Telehealth: Payer: Self-pay | Admitting: Physician Assistant

## 2015-11-23 DIAGNOSIS — F419 Anxiety disorder, unspecified: Secondary | ICD-10-CM

## 2015-11-23 NOTE — Telephone Encounter (Signed)
Joe with [facility] calling about pt's prescription    ALPRAZolam (XANAX) 1 MG tablet [facility] can't refill medication cause of Dr. Sharyon Medicus name is still on prescription and its not letting them fill medication. Pt still has 2 refill on the prescription per [facility].  Thanks CC

## 2015-11-23 NOTE — Telephone Encounter (Signed)
Please review

## 2015-11-24 MED ORDER — ALPRAZOLAM 1 MG PO TABS: 1.0000 mg | ORAL_TABLET | Freq: Two times a day (BID) | ORAL | 5 refills | Status: DC

## 2015-11-24 NOTE — Telephone Encounter (Signed)
Please call in Alprazolam 1mg  tab Take 1 tab PO BID prn #[id number] to [facility] [phone]. Thanks.

## 2015-11-24 NOTE — Telephone Encounter (Signed)
Prescription was called in for [facility] to [facility] in [place].  Thanks,  -Baer Hinton

## 2015-11-28 ENCOUNTER — Encounter: Payer: Self-pay | Admitting: Physician Assistant

## 2015-11-28 ENCOUNTER — Ambulatory Visit (INDEPENDENT_AMBULATORY_CARE_PROVIDER_SITE_OTHER): Payer: Medicare Other | Admitting: Physician Assistant

## 2015-11-28 VITALS — BP 110/70 | HR 72 | Temp 97.9°F | Resp 16

## 2015-11-28 DIAGNOSIS — B379 Candidiasis, unspecified: Secondary | ICD-10-CM | POA: Diagnosis not present

## 2015-11-28 DIAGNOSIS — F909 Attention-deficit hyperactivity disorder, unspecified type: Secondary | ICD-10-CM

## 2015-11-28 DIAGNOSIS — F988 Other specified behavioral and emotional disorders with onset usually occurring in childhood and adolescence: Secondary | ICD-10-CM

## 2015-11-28 DIAGNOSIS — J0140 Acute pansinusitis, unspecified: Secondary | ICD-10-CM

## 2015-11-28 DIAGNOSIS — Z833 Family history of diabetes mellitus: Secondary | ICD-10-CM | POA: Diagnosis not present

## 2015-11-28 DIAGNOSIS — E7800 Pure hypercholesterolemia, unspecified: Secondary | ICD-10-CM

## 2015-11-28 DIAGNOSIS — T3695XA Adverse effect of unspecified systemic antibiotic, initial encounter: Secondary | ICD-10-CM

## 2015-11-28 DIAGNOSIS — D649 Anemia, unspecified: Secondary | ICD-10-CM | POA: Diagnosis not present

## 2015-11-28 DIAGNOSIS — E049 Nontoxic goiter, unspecified: Secondary | ICD-10-CM

## 2015-11-28 DIAGNOSIS — F419 Anxiety disorder, unspecified: Secondary | ICD-10-CM

## 2015-11-28 DIAGNOSIS — E010 Iodine-deficiency related diffuse (endemic) goiter: Secondary | ICD-10-CM

## 2015-11-28 MED ORDER — FLUCONAZOLE 150 MG PO TABS: 150.0000 mg | ORAL_TABLET | Freq: Once | ORAL | 0 refills | Status: AC

## 2015-11-28 MED ORDER — AMOXICILLIN-POT CLAVULANATE 875-125 MG PO TABS: 1.0000 | ORAL_TABLET | Freq: Two times a day (BID) | ORAL | 0 refills | Status: DC

## 2015-11-28 NOTE — Patient Instructions (Signed)

## 2015-11-28 NOTE — Progress Notes (Signed)
Patient: Kathleen Buchanan Female    DOB: 1965/08/14   50 y.o.   MRN: [id number] Visit Date: 11/28/2015  Today's Provider: Mar Daring, PA-C   Chief Complaint  Patient presents with  . ADD  . Anxiety  . Hyperlipidemia   Subjective:    HPI  Follow up for ADD  The patient was last seen for this awhile ago. Changes made at last visit include no changes.  She reports excellent compliance with treatment. She feels that condition is Unchanged. She is not having side effects.   ------------------------------------------------------------------------------------    Depression, anxiety  She  was last seen for this several years ago. Changes made at last visit include no changes.   She reports excellent compliance with treatment. She is not having side effects.   She reports excellent tolerance of treatment. Current symptoms include: insomnia and weight gain. She has been working on weight loss and trying to add exercise as tolerated by her arthritis. She feels she is Unchanged since last visit.  ------------------------------------------------------------------------   Lipid/Cholesterol, Follow-up:   Last seen for this2 months ago.  Management changes since that visit include no changes. . Last Lipid Panel: No results found for: CHOL, TRIG, HDL, CHOLHDL, VLDL, LDLCALC, LDLDIRECT  Risk factors for vascular disease include hypercholesterolemia and smoking  She reports excellent compliance with treatment. She is not having side effects.  Current symptoms include none and have been stable. Weight trend: increasing steadily Prior visit with dietician: no Current diet: in general, a "healthy" diet   Current exercise: walking  Wt Readings from Last 3 Encounters:  07/25/15 200 lb (90.7 kg)  07/07/15 219 lb (99.3 kg)  11/08/14 213 lb (96.6 kg)   ------------------------------------------------------------------------------------  Sore Throat: Patient  complains of sore throat. Associated symptoms include chest congestion, bilateral ear pain, nasal blockage, post nasal drip, sinus and nasal congestion and sore throat. Patient reports headache and neck pain. Onset of symptoms was 1 week ago, gradually worsening since that time. She is drinking plenty of fluids. She has not had recent close exposure to someone with proven streptococcal pharyngitis.        Allergies  Allergen Reactions  . Codeine Hives  . Erythromycin   . Hydrocodone-Acetaminophen Hives  . Lansoprazole   . Levofloxacin   . Migraine Formula  [Aspirin-Acetaminophen-Caffeine]   . Morphine Sulfate   . Omeprazole   . Pepcid  [Famotidine]   . Sulfa Antibiotics      Current Outpatient Prescriptions:  .  albuterol (PROVENTIL HFA;VENTOLIN HFA) 108 (90 BASE) MCG/ACT inhaler, Inhale 2 puffs into the lungs every 4 (four) hours as needed for wheezing or shortness of breath., Disp: 1 Inhaler, Rfl: 5 .  ALPRAZolam (XANAX) 1 MG tablet, Take 1 tablet (1 mg total) by mouth 2 (two) times daily., Disp: 60 tablet, Rfl: 5 .  [START ON 12/05/2015] amphetamine-dextroamphetamine (ADDERALL) 30 MG tablet, Take 1 tablet by mouth 2 (two) times daily as needed. May refill on 12/05/15, Disp: 60 tablet, Rfl: 0 .  hydrochlorothiazide (HYDRODIURIL) 12.5 MG tablet, Take 1 tablet (12.5 mg total) by mouth daily., Disp: 90 tablet, Rfl: 1 .  ibuprofen (ADVIL,MOTRIN) 200 MG tablet, Take 4 tablets by mouth as needed., Disp: , Rfl:  .  NEXIUM 40 MG capsule, TAKE ONE CAPSULE BY MOUTH EVERY DAY, Disp: 90 capsule, Rfl: 1 .  [START ON 12/05/2015] oxyCODONE-acetaminophen (ENDOCET) 7.5-325 MG tablet, Take 1 tablet by mouth 2 (two) times daily. May refill on 12/05/15, Disp:  60 tablet, Rfl: 0 .  ascorbic acid (VITAMIN C) 500 MG tablet, Take 1 tablet by mouth daily., Disp: , Rfl:  .  MULTIPLE VITAMIN PO, Take 1 tablet by mouth daily., Disp: , Rfl:   Review of Systems  Constitutional: Negative.   HENT: Positive for  congestion, ear pain, sinus pressure and sore throat.   Respiratory: Negative.   Cardiovascular: Negative.   Gastrointestinal: Negative.   Musculoskeletal: Positive for arthralgias and back pain.  Neurological: Positive for headaches.  Psychiatric/Behavioral: Negative.     Social History  Substance Use Topics  . Smoking status: Current Every Day Smoker    Types: Cigarettes  . Smokeless tobacco: Never Used     Comment: "Im down to one a day"  . Alcohol use No   Objective:   BP 110/70 (BP Location: Left Arm, Patient Position: Sitting, Cuff Size: Large)   Pulse 72   Temp 97.9 F (36.6 C) (Oral)   Resp 16   SpO2 98%   Physical Exam  Constitutional: She appears well-developed and well-nourished. No distress.  HENT:  Head: Normocephalic and atraumatic.  Right Ear: Hearing, tympanic membrane, external ear and ear canal normal.  Left Ear: Hearing, tympanic membrane, external ear and ear canal normal.  Nose: Mucosal edema and rhinorrhea present. Right sinus exhibits maxillary sinus tenderness and frontal sinus tenderness. Left sinus exhibits maxillary sinus tenderness and frontal sinus tenderness.  Mouth/Throat: Uvula is midline and mucous membranes are normal. Posterior oropharyngeal erythema present. No oropharyngeal exudate or posterior oropharyngeal edema.  Neck: Normal range of motion. Neck supple. No tracheal deviation present. No thyromegaly present.  Cardiovascular: Normal rate, regular rhythm and normal heart sounds.  Exam reveals no gallop and no friction rub.   No murmur heard. Pulmonary/Chest: Effort normal and breath sounds normal. No stridor. No respiratory distress. She has no wheezes. She has no rales.  Lymphadenopathy:    She has no cervical adenopathy.  Skin: She is not diaphoretic.  Psychiatric: She has a normal mood and affect. Her behavior is normal. Judgment and thought content normal.  Vitals reviewed.     Assessment & Plan:     1. Big thyroid Will check  labs as below and f/u pending results. - TSH  2. Chronic anemia Will check labs as below and f/u pending results. - CBC with Differential  3. Hypercholesteremia Will check labs as below and f/u pending results. - Comprehensive Metabolic Panel (CMET) - Lipid Profile  4. Family history of diabetes mellitus type II Will check labs as below and f/u pending results. - Comprehensive Metabolic Panel (CMET) - HgB A1c  5. Acute pansinusitis, recurrence not specified Worsening symptoms that have not responded to over-the-counter medications. I will treat with Augmentin as below. She needs to make sure to stay well-hydrated and try to get plenty of rest. She is to call the office if symptoms fail to improve or worsen. - amoxicillin-clavulanate (AUGMENTIN) 875-125 MG tablet; Take 1 tablet by mouth 2 (two) times daily.  Dispense: 14 tablet; Refill: 0  6. Antibiotic-induced yeast infection Augmentin causes her to have yeast infections. Diflucan will be filled as below. She is to call the office if she has worsening symptoms. - fluconazole (DIFLUCAN) 150 MG tablet; Take 1 tablet (150 mg total) by mouth once. May repeat if needed in 48-72 hours after first if needed  Dispense: 2 tablet; Refill: 0  7. ADD (attention deficit disorder) Stable on Adderall. She is due for her next refill. In the October. I  will see her back in 6 months.  8. Anxiety Stable. Continue current medical treatment plan. I will see her back in 6 months.       Mar Daring, PA-C  [facility]

## 2015-11-29 ENCOUNTER — Telehealth: Payer: Self-pay

## 2015-11-29 DIAGNOSIS — E039 Hypothyroidism, unspecified: Secondary | ICD-10-CM

## 2015-11-29 LAB — COMPREHENSIVE METABOLIC PANEL
ALBUMIN: 4 g/dL (ref 3.5–5.5)
ALT: 16 IU/L (ref 0–32)
AST: 17 IU/L (ref 0–40)
Albumin/Globulin Ratio: 1.5 (ref 1.2–2.2)
Alkaline Phosphatase: 126 IU/L — ABNORMAL HIGH (ref 39–117)
BUN/Creatinine Ratio: 12 (ref 9–23)
BUN: 10 mg/dL (ref 6–24)
Bilirubin Total: 0.4 mg/dL (ref 0.0–1.2)
CALCIUM: 9.2 mg/dL (ref 8.7–10.2)
CO2: 28 mmol/L (ref 18–29)
CREATININE: 0.83 mg/dL (ref 0.57–1.00)
Chloride: 100 mmol/L (ref 96–106)
GFR, EST AFRICAN AMERICAN: 95 mL/min/{1.73_m2} (ref >59)
GFR, EST NON AFRICAN AMERICAN: 82 mL/min/{1.73_m2} (ref >59)
GLOBULIN, TOTAL: 2.7 g/dL (ref 1.5–4.5)
Glucose: 97 mg/dL (ref 65–99)
Potassium: 4.8 mmol/L (ref 3.5–5.2)
SODIUM: 139 mmol/L (ref 134–144)
TOTAL PROTEIN: 6.7 g/dL (ref 6.0–8.5)

## 2015-11-29 LAB — CBC WITH DIFFERENTIAL/PLATELET
BASOS ABS: 0 10*3/uL (ref 0.0–0.2)
Basos: 1 %
EOS (ABSOLUTE): 0.1 10*3/uL (ref 0.0–0.4)
Eos: 2 %
Hematocrit: 43.8 % (ref 34.0–46.6)
Hemoglobin: 15 g/dL (ref 11.1–15.9)
Immature Grans (Abs): 0 10*3/uL (ref 0.0–0.1)
Immature Granulocytes: 0 %
LYMPHS ABS: 1.4 10*3/uL (ref 0.7–3.1)
Lymphs: 22 %
MCH: 32.4 pg (ref 26.6–33.0)
MCHC: 34.2 g/dL (ref 31.5–35.7)
MCV: 95 fL (ref 79–97)
MONOS ABS: 0.2 10*3/uL (ref 0.1–0.9)
Monocytes: 4 %
NEUTROS ABS: 4.4 10*3/uL (ref 1.4–7.0)
Neutrophils: 71 %
PLATELETS: 191 10*3/uL (ref 150–379)
RBC: 4.63 x10E6/uL (ref 3.77–5.28)
RDW: 14.8 % (ref 12.3–15.4)
WBC: 6.2 10*3/uL (ref 3.4–10.8)

## 2015-11-29 LAB — LIPID PANEL
CHOLESTEROL TOTAL: 229 mg/dL — AB (ref 100–199)
Chol/HDL Ratio: 7.4 ratio units — ABNORMAL HIGH (ref 0.0–4.4)
HDL: 31 mg/dL — ABNORMAL LOW (ref >39)
LDL CALC: 160 mg/dL — AB (ref 0–99)
Triglycerides: 188 mg/dL — ABNORMAL HIGH (ref 0–149)
VLDL CHOLESTEROL CAL: 38 mg/dL (ref 5–40)

## 2015-11-29 LAB — HEMOGLOBIN A1C
ESTIMATED AVERAGE GLUCOSE: 111 mg/dL
Hgb A1c MFr Bld: 5.5 % (ref 4.8–5.6)

## 2015-11-29 LAB — TSH: TSH: 5.62 u[IU]/mL — ABNORMAL HIGH (ref 0.450–4.500)

## 2015-11-29 MED ORDER — LEVOTHYROXINE SODIUM 25 MCG PO TABS: 25.0000 ug | ORAL_TABLET | Freq: Every day | ORAL | 1 refills | Status: DC

## 2015-11-29 NOTE — Telephone Encounter (Signed)
Advised pt of lab results. Pt verbally acknowledges understanding. Pt prefers to work on lifestyle changes for now. Is asking if she should go ahead and start levothyroxine. States she was on the medication before, and has noticed she is more fatigued recently. [facility]. Renaldo Fiddler, CMA

## 2015-11-29 NOTE — Telephone Encounter (Signed)
-----   Message from Mar Daring, [place] sent at 11/29/2015 11:21 AM EDT ----- Cholesterol is elevated and cardiovascular risk is slightly elevated at a 8.5% risk in the next ten years. Would recommend a cholesterol lowering medication if she will start one. If not really work on lifestyle modifications with healthy dieting (limiting fats and carbs) and adding physical activity to a regular routine. Also TSH is slightly off. Will need to recheck TSH in 6-8 weeks. If still elevated may need to add thyroid medication. Will recheck cholesterol in 6 months.

## 2015-11-29 NOTE — Telephone Encounter (Signed)
Pt advised. Adrik Khim Drozdowski, CMA  

## 2015-11-29 NOTE — Telephone Encounter (Signed)
Sent in levothyroxine 70mcg to [facility]. Will still recheck in 6-8 weeks.

## 2015-12-07 ENCOUNTER — Other Ambulatory Visit: Payer: Self-pay

## 2015-12-07 DIAGNOSIS — E039 Hypothyroidism, unspecified: Secondary | ICD-10-CM

## 2015-12-07 MED ORDER — LEVOTHYROXINE SODIUM 25 MCG PO TABS: 25.0000 ug | ORAL_TABLET | Freq: Every day | ORAL | 1 refills | Status: DC

## 2015-12-07 NOTE — Telephone Encounter (Signed)
Pharmacy requesting 90 day supply. Thanks!  

## 2015-12-11 ENCOUNTER — Telehealth: Payer: Self-pay | Admitting: Physician Assistant

## 2015-12-11 DIAGNOSIS — J0140 Acute pansinusitis, unspecified: Secondary | ICD-10-CM

## 2015-12-11 MED ORDER — AMOXICILLIN-POT CLAVULANATE 875-125 MG PO TABS: 1.0000 | ORAL_TABLET | Freq: Two times a day (BID) | ORAL | 0 refills | Status: DC

## 2015-12-11 NOTE — Telephone Encounter (Signed)
Pt contacted office for refill request on the following medications:  amoxicillin-clavulanate (AUGMENTIN) 875-125 MG tablet.  [facility].  CB#[id number]/MW  Pt states she still has a sinus infection.

## 2015-12-11 NOTE — Telephone Encounter (Signed)
Augmentin refilled.

## 2016-01-01 ENCOUNTER — Other Ambulatory Visit: Payer: Self-pay | Admitting: Family Medicine

## 2016-01-01 DIAGNOSIS — R609 Edema, unspecified: Secondary | ICD-10-CM

## 2016-01-05 ENCOUNTER — Telehealth: Payer: Self-pay | Admitting: Physician Assistant

## 2016-01-05 DIAGNOSIS — M778 Other enthesopathies, not elsewhere classified: Secondary | ICD-10-CM

## 2016-01-05 DIAGNOSIS — F900 Attention-deficit hyperactivity disorder, predominantly inattentive type: Secondary | ICD-10-CM

## 2016-01-05 NOTE — Telephone Encounter (Signed)
Pt contacted office for refill request on the following medications: 1. amphetamine-dextroamphetamine (ADDERALL) 30 MG tablet  2. oxyCODONE-acetaminophen (ENDOCET) 7.5-325 MG tablet  Last written: 12/05/15 Last OV: 11/28/15 Please advise. Thanks TNP

## 2016-01-09 MED ORDER — AMPHETAMINE-DEXTROAMPHETAMINE 30 MG PO TABS: 1.0000 | ORAL_TABLET | Freq: Two times a day (BID) | ORAL | 0 refills | Status: DC

## 2016-01-09 MED ORDER — OXYCODONE-ACETAMINOPHEN 7.5-325 MG PO TABS: 1.0000 | ORAL_TABLET | Freq: Two times a day (BID) | ORAL | 0 refills | Status: DC

## 2016-01-09 NOTE — Telephone Encounter (Signed)
Adderall and endocet refilled x 3 months. Will not be due for refills until end of January 2018

## 2016-01-09 NOTE — Telephone Encounter (Signed)
LMTCB

## 2016-01-12 ENCOUNTER — Other Ambulatory Visit: Payer: Self-pay | Admitting: Physician Assistant

## 2016-01-12 DIAGNOSIS — E039 Hypothyroidism, unspecified: Secondary | ICD-10-CM

## 2016-01-12 NOTE — Telephone Encounter (Signed)
Patient advised as below.  

## 2016-01-12 NOTE — Progress Notes (Signed)
Thyroid panel ordered.  

## 2016-01-13 LAB — THYROID PANEL WITH TSH
Free Thyroxine Index: 1.2 (ref 1.2–4.9)
T3 Uptake Ratio: 21 % — ABNORMAL LOW (ref 24–39)
T4 TOTAL: 5.9 ug/dL (ref 4.5–12.0)
TSH: 5.32 u[IU]/mL — ABNORMAL HIGH (ref 0.450–4.500)

## 2016-01-15 ENCOUNTER — Telehealth: Payer: Self-pay

## 2016-01-15 MED ORDER — LEVOTHYROXINE SODIUM 50 MCG PO TABS: 50.0000 ug | ORAL_TABLET | Freq: Every day | ORAL | 0 refills | Status: DC

## 2016-01-15 NOTE — Telephone Encounter (Signed)
-----   Message from Mar Daring, PA-C sent at 01/15/2016  8:40 AM EST ----- Thyroid still off. Will increase levothyroxine dose.

## 2016-01-15 NOTE — Telephone Encounter (Signed)
Patient advised as directed below.  Thanks,  -Helaine Yackel 

## 2016-01-15 NOTE — Addendum Note (Signed)
Addended by: Mar Daring on: 01/15/2016 08:42 AM   Modules accepted: Orders

## 2016-02-14 ENCOUNTER — Telehealth: Payer: Self-pay | Admitting: Physician Assistant

## 2016-02-14 DIAGNOSIS — M62838 Other muscle spasm: Secondary | ICD-10-CM

## 2016-02-14 MED ORDER — CYCLOBENZAPRINE HCL 5 MG PO TABS: 5.0000 mg | ORAL_TABLET | Freq: Three times a day (TID) | ORAL | 1 refills | Status: DC | PRN

## 2016-02-14 NOTE — Telephone Encounter (Signed)
Pt stated that she pulled a muscle in her back again and she would like a muscle relaxer sent to [place]. Pt stated Dr. Venia Minks has sent in Flexeril in the past. Pt stated that she hurts every time she tries to bend down. I advised to schedule and OV and pt request the message be sent back to see if she can get the Rx without OV. Last OV 11/28/15. Please advise. Thanks TNP

## 2016-02-14 NOTE — Telephone Encounter (Signed)
Please review. Kathleen Buchanan, CMA  

## 2016-02-14 NOTE — Telephone Encounter (Signed)
Cyclobenzaprine 5mg  sent in

## 2016-02-15 ENCOUNTER — Encounter: Payer: Self-pay | Admitting: Physician Assistant

## 2016-03-14 ENCOUNTER — Ambulatory Visit: Payer: Medicare Other | Admitting: Physician Assistant

## 2016-03-18 ENCOUNTER — Ambulatory Visit: Payer: Self-pay | Admitting: Physician Assistant

## 2016-03-25 ENCOUNTER — Ambulatory Visit: Payer: Self-pay | Admitting: Physician Assistant

## 2016-04-01 ENCOUNTER — Ambulatory Visit: Payer: Self-pay | Admitting: Physician Assistant

## 2016-04-08 ENCOUNTER — Other Ambulatory Visit: Payer: Self-pay | Admitting: Physician Assistant

## 2016-04-08 NOTE — Telephone Encounter (Signed)
No--30 days only--she then needs appt with [facility] for any more refills.

## 2016-04-08 NOTE — Telephone Encounter (Signed)
This is a pt of Jenni.    Pt contacted office for refill request on the following medications: Pt request a 90 day supply for both. CB#[phone]/MW  oxyCODONE-acetaminophen (ENDOCET) 7.5-325 MG tablet  amphetamine-dextroamphetamine (ADDERALL) 30 MG tablet

## 2016-04-08 NOTE — Telephone Encounter (Signed)
Pt advised and agrees with treatment plan. Emily Drozdowski, CMA  

## 2016-04-08 NOTE — Telephone Encounter (Signed)
Please review-aa 

## 2016-04-09 ENCOUNTER — Other Ambulatory Visit: Payer: Self-pay

## 2016-04-09 DIAGNOSIS — F900 Attention-deficit hyperactivity disorder, predominantly inattentive type: Secondary | ICD-10-CM

## 2016-04-09 DIAGNOSIS — M778 Other enthesopathies, not elsewhere classified: Secondary | ICD-10-CM

## 2016-04-09 MED ORDER — AMPHETAMINE-DEXTROAMPHETAMINE 30 MG PO TABS: 1.0000 | ORAL_TABLET | Freq: Two times a day (BID) | ORAL | 0 refills | Status: DC

## 2016-04-09 MED ORDER — OXYCODONE-ACETAMINOPHEN 7.5-325 MG PO TABS: 1.0000 | ORAL_TABLET | Freq: Two times a day (BID) | ORAL | 0 refills | Status: DC

## 2016-04-11 ENCOUNTER — Other Ambulatory Visit: Payer: Self-pay | Admitting: Physician Assistant

## 2016-04-11 ENCOUNTER — Ambulatory Visit: Payer: Medicare Other | Admitting: Physician Assistant

## 2016-04-11 DIAGNOSIS — E039 Hypothyroidism, unspecified: Secondary | ICD-10-CM

## 2016-04-12 ENCOUNTER — Encounter: Payer: Self-pay | Admitting: Physician Assistant

## 2016-04-12 ENCOUNTER — Ambulatory Visit (INDEPENDENT_AMBULATORY_CARE_PROVIDER_SITE_OTHER): Payer: Medicare Other | Admitting: Physician Assistant

## 2016-04-12 VITALS — BP 110/70 | HR 60 | Temp 98.2°F | Resp 16 | Wt 209.0 lb

## 2016-04-12 DIAGNOSIS — E039 Hypothyroidism, unspecified: Secondary | ICD-10-CM

## 2016-04-12 DIAGNOSIS — N6001 Solitary cyst of right breast: Secondary | ICD-10-CM | POA: Diagnosis not present

## 2016-04-12 DIAGNOSIS — J324 Chronic pansinusitis: Secondary | ICD-10-CM | POA: Diagnosis not present

## 2016-04-12 DIAGNOSIS — N63 Unspecified lump in unspecified breast: Secondary | ICD-10-CM | POA: Diagnosis not present

## 2016-04-12 DIAGNOSIS — N6320 Unspecified lump in the left breast, unspecified quadrant: Secondary | ICD-10-CM | POA: Diagnosis not present

## 2016-04-12 DIAGNOSIS — N6002 Solitary cyst of left breast: Secondary | ICD-10-CM

## 2016-04-12 MED ORDER — AMOXICILLIN-POT CLAVULANATE 875-125 MG PO TABS: 1.0000 | ORAL_TABLET | Freq: Two times a day (BID) | ORAL | 0 refills | Status: DC

## 2016-04-12 MED ORDER — LEVOTHYROXINE SODIUM 75 MCG PO TABS: 75.0000 ug | ORAL_TABLET | Freq: Every day | ORAL | 3 refills | Status: DC

## 2016-04-12 NOTE — Patient Instructions (Signed)
Breast Tenderness Breast tenderness is a common problem for women of all ages. Breast tenderness may cause mild discomfort to severe pain. The pain usually comes and goes in association with your menstrual cycle, but it can be constant. Breast tenderness has many possible causes, including hormone changes and some medicines. Your health care provider may order tests, such as a mammogram or an ultrasound, to check for any unusual findings. Having breast tenderness usually does not mean that you have breast cancer. Follow these instructions at home: Sometimes, reassurance that you do not have breast cancer is all that is needed. In general, follow these home care instructions: Managing pain and discomfort  If directed, apply ice to the area:  Put ice in a plastic bag.  Place a towel between your skin and the bag.  Leave the ice on for 20 minutes, 2-3 times a day.  Make sure you are wearing a supportive bra, especially during exercise. You may also want to wear a supportive bra while sleeping if your breasts are very tender. Medicines  Take over-the-counter and prescription medicines only as told by your health care provider. If the cause of your pain is infection, you may be prescribed an antibiotic medicine.  If you were prescribed an antibiotic, take it as told by your health care provider. Do not stop taking the antibiotic even if you start to feel better. General instructions  Your health care provider may recommend that you reduce the amount of fat in your diet. You can do this by:  Limiting fried foods.  Cooking foods using methods, such as baking, boiling, grilling, and broiling.  Decrease the amount of caffeine in your diet. You can do this by drinking more water and choosing caffeine-free options.  Keep a log of the days and times when your breasts are most tender.  Ask your health care provider how to do breast exams at home. This will help you notice if you have an unusual  growth or lump. Contact a health care provider if:  Any part of your breast is hard, red, and hot to the touch. This may be a sign of infection.  You are not breastfeeding and you have fluid, especially blood or pus, coming out of your nipples.  You have a fever.  You have a new or painful lump in your breast that remains after your menstrual period ends.  Your pain does not improve or it gets worse.  Your pain is interfering with your daily activities. This information is not intended to replace advice given to you by your health care provider. Make sure you discuss any questions you have with your health care provider. Document Released: 02/08/2008 Document Revised: 11/24/2015 Document Reviewed: 11/24/2015 Elsevier Interactive Patient Education  2017 Elsevier Inc.  

## 2016-04-12 NOTE — Progress Notes (Signed)
Patient: Kathleen Buchanan Female    DOB: 07-20-1965   51 y.o.   MRN: [id number] Visit Date: 04/12/2016  Today's Provider: Mar Daring, PA-C   Chief Complaint  Patient presents with  . Breast Problem   Subjective:    HPI Patient here today c/o breast soreness right more than left and right under arm x's several months. Patient reports pain has worsened in the last week. Patient denies injuries, redness, or rash. Patient reports she has had only one mammogram and that was about 20 years ago.  Last mammogram was positive for a cyst that required repeat imaging.     Allergies  Allergen Reactions  . Codeine Hives  . Erythromycin   . Hydrocodone-Acetaminophen Hives  . Lansoprazole   . Levofloxacin   . Migraine Formula  [Aspirin-Acetaminophen-Caffeine]   . Morphine Sulfate   . Omeprazole   . Pepcid  [Famotidine]   . Sulfa Antibiotics      Current Outpatient Prescriptions:  .  albuterol (PROVENTIL HFA;VENTOLIN HFA) 108 (90 BASE) MCG/ACT inhaler, Inhale 2 puffs into the lungs every 4 (four) hours as needed for wheezing or shortness of breath., Disp: 1 Inhaler, Rfl: 5 .  ALPRAZolam (XANAX) 1 MG tablet, Take 1 tablet (1 mg total) by mouth 2 (two) times daily., Disp: 60 tablet, Rfl: 5 .  amphetamine-dextroamphetamine (ADDERALL) 30 MG tablet, Take 1 tablet by mouth 2 (two) times daily., Disp: 60 tablet, Rfl: 0 .  ascorbic acid (VITAMIN C) 500 MG tablet, Take 1,000 mg by mouth daily. , Disp: , Rfl:  .  cyclobenzaprine (FLEXERIL) 5 MG tablet, Take 1 tablet (5 mg total) by mouth 3 (three) times daily as needed for muscle spasms., Disp: 30 tablet, Rfl: 1 .  hydrochlorothiazide (HYDRODIURIL) 12.5 MG tablet, TAKE 1 TABLET BY MOUTH DAILY., Disp: 90 tablet, Rfl: 1 .  ibuprofen (ADVIL,MOTRIN) 200 MG tablet, Take 4 tablets by mouth as needed., Disp: , Rfl:  .  levothyroxine (SYNTHROID, LEVOTHROID) 50 MCG tablet, TAKE 1 TABLET BY MOUTH DAILY BEFORE BREAKFAST., Disp: 90 tablet, Rfl:  0 .  MULTIPLE VITAMIN PO, Take 1 tablet by mouth daily., Disp: , Rfl:  .  NEXIUM 40 MG capsule, TAKE ONE CAPSULE BY MOUTH EVERY DAY, Disp: 90 capsule, Rfl: 1 .  oxyCODONE-acetaminophen (ENDOCET) 7.5-325 MG tablet, Take 1 tablet by mouth 2 (two) times daily., Disp: 60 tablet, Rfl: 0  Review of Systems  Constitutional: Positive for diaphoresis and fatigue. Negative for fever.  HENT: Positive for postnasal drip, rhinorrhea, sinus pain and sinus pressure. Negative for congestion, ear discharge, sneezing, sore throat and tinnitus.   Respiratory: Negative for cough, chest tightness, shortness of breath and wheezing.   Cardiovascular: Negative for chest pain, palpitations and leg swelling.  Gastrointestinal: Negative for abdominal pain.  Neurological: Positive for headaches. Negative for dizziness.    Social History  Substance Use Topics  . Smoking status: Current Every Day Smoker    Types: Cigarettes  . Smokeless tobacco: Never Used     Comment: less than a 1/2 a pack a day  . Alcohol use No   Objective:   BP 110/70 (BP Location: Left Arm, Patient Position: Sitting, Cuff Size: Large)   Pulse 60   Temp 98.2 F (36.8 C) (Oral)   Resp 16   Wt 209 lb (94.8 kg)   BMI 37.32 kg/m   Physical Exam  Constitutional: She appears well-developed and well-nourished. No distress.  HENT:  Head: Normocephalic and atraumatic.  Right Ear: Hearing, tympanic membrane, external ear and ear canal normal.  Left Ear: Hearing, tympanic membrane, external ear and ear canal normal.  Nose: No mucosal edema or rhinorrhea. Right sinus exhibits maxillary sinus tenderness and frontal sinus tenderness. Left sinus exhibits maxillary sinus tenderness and frontal sinus tenderness.  Mouth/Throat: Uvula is midline, oropharynx is clear and moist and mucous membranes are normal. No oropharyngeal exudate.  Neck: Normal range of motion. Neck supple. No tracheal deviation present. No thyromegaly present.  Cardiovascular:  Normal rate, regular rhythm and normal heart sounds.  Exam reveals no gallop and no friction rub.   No murmur heard. Pulmonary/Chest: Effort normal and breath sounds normal. No stridor. No respiratory distress. She has no decreased breath sounds. She has no wheezes. She has no rales. Right breast exhibits mass and tenderness. Right breast exhibits no inverted nipple, no nipple discharge and no skin change. Left breast exhibits mass and tenderness. Left breast exhibits no inverted nipple, no nipple discharge and no skin change. Breasts are symmetrical.    Small, tender, movable nodule noted in the left breast in the 6:00 position approximate 4 cm from nipple line measuring approximately 1.5 cm x 2.5 cm  There were a few nodules noted in the right breast: 1) there was a small mass noted in the 6:00 position that was tender to palpation and movable that was approximately 1 cm from nipple line 2) second nodule was in the same linear aspect in the 6:00 position approximately 4.5 cm from nipple line that was also very small and tender to palpation possibly communicating but hard to tell. 3) the last was a large communicating palpable area in the 3:00 position that was very tender to palpation that seems like it may possibly have been a duct.  Lymphadenopathy:    She has no cervical adenopathy.  Skin: She is not diaphoretic.  Vitals reviewed.       Assessment & Plan:     1. Hypothyroidism, unspecified type Stable. Diagnosis pulled for medication refill. Continue current medical treatment plan. - levothyroxine (SYNTHROID, LEVOTHROID) 75 MCG tablet; Take 1 tablet (75 mcg total) by mouth daily.  Dispense: 90 tablet; Refill: 3  2. Breast mass in female Multiple breast masses noted in bilateral breast that were all tender and movable. I do feel they were cystic in nature. Patient is long overdue for repeat mammogram [place] I have ordered a diagnostic mammogram with ultrasounds due to the abnormalities. I  will follow-up with her pending these results. - MM Digital Diagnostic Bilat; Future - US BREAST COMPLETE UNI LEFT INC AXILLA; Future - US BREAST COMPLETE UNI RIGHT INC AXILLA; Future  3. Chronic pansinusitis Worsening symptoms that have not responded to OTC medications. Will give augmentin as below. Continue allergy medications. Stay well hydrated and get plenty of rest. Call if no symptom improvement or if symptoms worsen. She does continue to have recurrent sinusitis. She reports that she will get better while she is on antibiotic but approximately 1-2 weeks after completing the antibiotic her symptoms come right back. She does report that she is compliant with allergy medications. She has never had an ENT evaluation for her sinusitis. I will refer her to ENT as below and follow-up pending their recommendations. - amoxicillin-clavulanate (AUGMENTIN) 875-125 MG tablet; Take 1 tablet by mouth 2 (two) times daily.  Dispense: 20 tablet; Refill: 0 - Ambulatory referral to ENT       Mar Daring, PA-C  [facility]

## 2016-04-15 ENCOUNTER — Other Ambulatory Visit: Payer: Self-pay | Admitting: Physician Assistant

## 2016-04-15 DIAGNOSIS — N63 Unspecified lump in unspecified breast: Secondary | ICD-10-CM

## 2016-04-15 NOTE — Progress Notes (Signed)
Orders signed.

## 2016-04-15 NOTE — Progress Notes (Signed)
Diagnostic mammogram ordered. Had accidentally been canceled.

## 2016-04-26 ENCOUNTER — Other Ambulatory Visit: Payer: Self-pay | Admitting: Physician Assistant

## 2016-04-26 ENCOUNTER — Ambulatory Visit
Admission: RE | Admit: 2016-04-26 | Discharge: 2016-04-26 | Disposition: A | Discharge status: Home / Self Care | Payer: Medicare Other | Source: Ambulatory Visit | Attending: Physician Assistant | Admitting: Physician Assistant

## 2016-04-26 ENCOUNTER — Telehealth: Payer: Self-pay

## 2016-04-26 ENCOUNTER — Other Ambulatory Visit: Payer: Self-pay

## 2016-04-26 DIAGNOSIS — N63 Unspecified lump in unspecified breast: Secondary | ICD-10-CM

## 2016-04-26 DIAGNOSIS — R928 Other abnormal and inconclusive findings on diagnostic imaging of breast: Secondary | ICD-10-CM | POA: Diagnosis not present

## 2016-04-26 DIAGNOSIS — N6489 Other specified disorders of breast: Secondary | ICD-10-CM | POA: Diagnosis not present

## 2016-04-26 DIAGNOSIS — N6310 Unspecified lump in the right breast, unspecified quadrant: Secondary | ICD-10-CM | POA: Insufficient documentation

## 2016-04-26 DIAGNOSIS — N6320 Unspecified lump in the left breast, unspecified quadrant: Secondary | ICD-10-CM | POA: Diagnosis not present

## 2016-04-26 DIAGNOSIS — K219 Gastro-esophageal reflux disease without esophagitis: Secondary | ICD-10-CM

## 2016-04-26 MED ORDER — ESOMEPRAZOLE MAGNESIUM 40 MG PO CPDR: 40.0000 mg | DELAYED_RELEASE_CAPSULE | Freq: Every day | ORAL | 1 refills | Status: DC

## 2016-04-26 NOTE — Telephone Encounter (Signed)
Patient has been advised. KW 

## 2016-04-26 NOTE — Telephone Encounter (Signed)
-----   Message from Mar Daring, PA-C sent at 04/26/2016  2:19 PM EST ----- Normal mammograms and [place]. Repeat normal mammogram screening in one year

## 2016-05-10 ENCOUNTER — Ambulatory Visit (INDEPENDENT_AMBULATORY_CARE_PROVIDER_SITE_OTHER): Payer: Medicare Other | Admitting: Physician Assistant

## 2016-05-10 ENCOUNTER — Encounter: Payer: Self-pay | Admitting: Physician Assistant

## 2016-05-10 VITALS — BP 106/70 | HR 71 | Temp 97.8°F | Resp 16 | Ht 62.0 in | Wt 219.0 lb

## 2016-05-10 DIAGNOSIS — K219 Gastro-esophageal reflux disease without esophagitis: Secondary | ICD-10-CM

## 2016-05-10 DIAGNOSIS — F900 Attention-deficit hyperactivity disorder, predominantly inattentive type: Secondary | ICD-10-CM

## 2016-05-10 DIAGNOSIS — G8929 Other chronic pain: Secondary | ICD-10-CM | POA: Diagnosis not present

## 2016-05-10 DIAGNOSIS — F419 Anxiety disorder, unspecified: Secondary | ICD-10-CM

## 2016-05-10 DIAGNOSIS — M778 Other enthesopathies, not elsewhere classified: Secondary | ICD-10-CM | POA: Diagnosis not present

## 2016-05-10 MED ORDER — AMPHETAMINE-DEXTROAMPHETAMINE 30 MG PO TABS: 1.0000 | ORAL_TABLET | Freq: Two times a day (BID) | ORAL | 0 refills | Status: DC

## 2016-05-10 MED ORDER — ALPRAZOLAM 1 MG PO TABS: 1.0000 mg | ORAL_TABLET | Freq: Two times a day (BID) | ORAL | 5 refills | Status: DC

## 2016-05-10 MED ORDER — PANTOPRAZOLE SODIUM 40 MG PO TBEC: 40.0000 mg | DELAYED_RELEASE_TABLET | Freq: Every day | ORAL | 5 refills | Status: DC

## 2016-05-10 MED ORDER — OXYCODONE-ACETAMINOPHEN 7.5-325 MG PO TABS: 1.0000 | ORAL_TABLET | Freq: Two times a day (BID) | ORAL | 0 refills | Status: DC

## 2016-05-10 NOTE — Progress Notes (Signed)
Patient: Kathleen Buchanan Female    DOB: 02-08-1966   51 y.o.   MRN: [id number] Visit Date: 05/10/2016  Today's Provider: Mar Daring, PA-C   Chief Complaint  Patient presents with  . Follow-up    wrist pain  . ADD  . Anxiety   Subjective:    HPI  Follow up for wrist pain  The patient was last seen for this several months ago. Changes made at last visit include refill oxycodone-acetaminophen 7.5-325 mg.  She reports excellent compliance with treatment. Patient reports she takes 1-2 tablets a daily. She feels that condition is Unchanged. She is not having side effects.   ------------------------------------------------------------------------------------   Follow up for ADD  The patient was last seen for this 6 months ago. Changes made at last visit include no changes continue Adderall.  She reports excellent compliance with treatment. Patient reports that she has not taken in the last 5 days due to stomach pain.  She feels that condition is Unchanged. She is not having side effects.   ------------------------------------------------------------------------------------   Anxiety, Follow-up  She  was last seen for this 6 months ago. Changes made at last visit include no changes continue .   She reports excellent compliance with treatment. Patient reports she takes 1-2 tablets PRN 4 times a week. She is not having side effects.   She reports excellent tolerance of treatment. Current symptoms include: none She feels she is Improved since last visit.  Depression screen [facility] 2/9 05/10/2016 10/06/2015  Decreased Interest 0 0  Down, Depressed, Hopeless 0 0  PHQ - 2 Score 0 0  Altered sleeping 3 -  Tired, decreased energy 2 -  Change in appetite 0 -  Feeling bad or failure about yourself  0 -  Trouble concentrating 0 -  Moving slowly or fidgety/restless 0 -  Suicidal thoughts 0 -  PHQ-9 Score 5 -  Difficult doing work/chores Not difficult at all -      ------------------------------------------------------------------------ Patient c/o abdominal pain x's 1 week. Patient reports pain is worse after eating. Located in epigastric region. Patient reports she has been eating greasy and spicy foods.    Allergies  Allergen Reactions  . Codeine Hives  . Erythromycin   . Hydrocodone-Acetaminophen Hives  . Lansoprazole   . Levofloxacin   . Migraine Formula  [Aspirin-Acetaminophen-Caffeine]   . Morphine Sulfate   . Omeprazole   . Pepcid  [Famotidine]   . Sulfa Antibiotics      Current Outpatient Prescriptions:  .  albuterol (PROVENTIL HFA;VENTOLIN HFA) 108 (90 BASE) MCG/ACT inhaler, Inhale 2 puffs into the lungs every 4 (four) hours as needed for wheezing or shortness of breath., Disp: 1 Inhaler, Rfl: 5 .  ALPRAZolam (XANAX) 1 MG tablet, Take 1 tablet (1 mg total) by mouth 2 (two) times daily., Disp: 60 tablet, Rfl: 5 .  amoxicillin-clavulanate (AUGMENTIN) 875-125 MG tablet, Take 1 tablet by mouth 2 (two) times daily., Disp: 20 tablet, Rfl: 0 .  amphetamine-dextroamphetamine (ADDERALL) 30 MG tablet, Take 1 tablet by mouth 2 (two) times daily., Disp: 60 tablet, Rfl: 0 .  ascorbic acid (VITAMIN C) 500 MG tablet, Take 1,000 mg by mouth daily. , Disp: , Rfl:  .  cyclobenzaprine (FLEXERIL) 5 MG tablet, Take 1 tablet (5 mg total) by mouth 3 (three) times daily as needed for muscle spasms., Disp: 30 tablet, Rfl: 1 .  esomeprazole (NEXIUM) 40 MG capsule, Take 1 capsule (40 mg total) by mouth daily., Disp:  90 capsule, Rfl: 1 .  hydrochlorothiazide (HYDRODIURIL) 12.5 MG tablet, TAKE 1 TABLET BY MOUTH DAILY., Disp: 90 tablet, Rfl: 1 .  ibuprofen (ADVIL,MOTRIN) 200 MG tablet, Take 4 tablets by mouth as needed., Disp: , Rfl:  .  levothyroxine (SYNTHROID, LEVOTHROID) 75 MCG tablet, Take 1 tablet (75 mcg total) by mouth daily., Disp: 90 tablet, Rfl: 3 .  MULTIPLE VITAMIN PO, Take 1 tablet by mouth daily., Disp: , Rfl:  .  oxyCODONE-acetaminophen  (ENDOCET) 7.5-325 MG tablet, Take 1 tablet by mouth 2 (two) times daily., Disp: 60 tablet, Rfl: 0  Review of Systems  Constitutional: Negative.   Respiratory: Negative.   Cardiovascular: Negative.   Gastrointestinal: Positive for abdominal distention, abdominal pain, constipation and nausea.  Psychiatric/Behavioral: Positive for decreased concentration. Negative for dysphoric mood and sleep disturbance. The patient is nervous/anxious.        Medications help    Social History  Substance Use Topics  . Smoking status: Current Every Day Smoker    Types: Cigarettes  . Smokeless tobacco: Never Used     Comment: less than a 1/2 a pack a day  . Alcohol use No   Objective:   BP 106/70 (BP Location: Left Arm, Patient Position: Sitting, Cuff Size: Large)   Pulse 71   Temp 97.8 F (36.6 C) (Oral)   Resp 16   Ht 5\' 2"  (1.575 m)   Wt 219 lb (99.3 kg)   SpO2 97%   BMI 40.06 kg/m  Vitals:   05/10/16 0849  BP: 106/70  Pulse: 71  Resp: 16  Temp: 97.8 F (36.6 C)  TempSrc: Oral  SpO2: 97%  Weight: 219 lb (99.3 kg)  Height: 5\' 2"  (1.575 m)     Physical Exam  Constitutional: She appears well-developed and well-nourished. No distress.  Neck: Normal range of motion. Neck supple. No tracheal deviation present. No thyromegaly present.  Cardiovascular: Normal rate, regular rhythm and normal heart sounds.  Exam reveals no gallop and no friction rub.   No murmur heard. Pulmonary/Chest: Effort normal and breath sounds normal. No respiratory distress. She has no wheezes. She has no rales.  Lymphadenopathy:    She has no cervical adenopathy.  Skin: She is not diaphoretic.  Vitals reviewed.      Assessment & Plan:     1. Encounter for chronic pain management Urine collected and sent to [facility].  - [facility] (14 Drugs), Ur  2. Tendinitis of wrist Stable. Diagnosis pulled for medication refill. Continue current medical treatment plan. - oxyCODONE-acetaminophen (ENDOCET) 7.5-325 MG  tablet; Take 1 tablet by mouth 2 (two) times daily.  Dispense: 60 tablet; Refill: 0  3. Anxiety Stable. Diagnosis pulled for medication refill. Continue current medical treatment plan. - ALPRAZolam (XANAX) 1 MG tablet; Take 1 tablet (1 mg total) by mouth 2 (two) times daily.  Dispense: 60 tablet; Refill: 5  4. Gastroesophageal reflux disease, esophagitis presence not specifie Symptoms worsening on nexium. Most likely diet related. Discussed diet in detail and she voiced understanding. Will change therapy to Protonix as below to see if this helps symptoms. She is to call if symptoms worsen. - pantoprazole (PROTONIX) 40 MG tablet; Take 1 tablet (40 mg total) by mouth daily.  Dispense: 30 tablet; Refill: 5  5. Attention deficit hyperactivity disorder (ADHD), predominantly inattentive type Rx printed x 3. She should not be due for refill until near the end of May. - amphetamine-dextroamphetamine (ADDERALL) 30 MG tablet; Take 1 tablet by mouth 2 (two) times daily.  Please do not 07/08/16  Dispense: 60 tablet; Refill: 0       Mar Daring, PA-C  [facility]

## 2016-05-11 LAB — PAIN MGT SCRN (14 DRUGS), UR
Amphetamine Screen, Ur: NEGATIVE ng/mL
BUPRENORPHINE, URINE: NEGATIVE ng/mL
Barbiturate Screen, Ur: NEGATIVE ng/mL
Benzodiazepine Screen, Urine: NEGATIVE ng/mL
COCAINE(METAB.) SCREEN, URINE: NEGATIVE ng/mL
CREATININE(CRT), U: 183.1 mg/dL (ref 20.0–300.0)
Cannabinoids Ur Ql Scn: NEGATIVE ng/mL
FENTANYL, URINE: NEGATIVE pg/mL
METHADONE SCREEN, URINE: NEGATIVE ng/mL
Meperidine Screen, Urine: NEGATIVE ng/mL
OXYCODONE+OXYMORPHONE UR QL SCN: POSITIVE ng/mL
Opiate Scrn, Ur: NEGATIVE ng/mL
PCP Scrn, Ur: NEGATIVE ng/mL
PROPOXYPHENE SCREEN: NEGATIVE ng/mL
Ph of Urine: 5.5 (ref 4.5–8.9)
Tramadol Ur Ql Scn: NEGATIVE ng/mL

## 2016-05-13 ENCOUNTER — Telehealth: Payer: Self-pay

## 2016-05-13 NOTE — Telephone Encounter (Signed)
NA

## 2016-05-13 NOTE — Telephone Encounter (Signed)
-----   Message from Mar Daring, [place] sent at 05/13/2016 11:07 AM EST ----- Urine test normal for medications you are prescribed.

## 2016-05-13 NOTE — Telephone Encounter (Signed)
Patient advised as directed below.  Thanks,  -Ramzy Cappelletti 

## 2016-05-29 ENCOUNTER — Ambulatory Visit: Payer: Medicare Other | Admitting: Physician Assistant

## 2016-06-03 ENCOUNTER — Telehealth: Payer: Self-pay | Admitting: Physician Assistant

## 2016-06-03 DIAGNOSIS — M778 Other enthesopathies, not elsewhere classified: Secondary | ICD-10-CM

## 2016-06-03 NOTE — Telephone Encounter (Signed)
Pt needs refill on her   oxyCODONE-acetaminophen (ENDOCET) 7.5-325 MG tablet  Thank sTeri

## 2016-06-03 NOTE — Telephone Encounter (Signed)
Refill is early. She does not need to 06/10/16. I can refill and post date if she is just wanting to pick up because she will be in town.

## 2016-06-03 NOTE — Telephone Encounter (Signed)
LMTCB

## 2016-06-03 NOTE — Telephone Encounter (Signed)
Last filled 05/10/16-aa

## 2016-06-04 MED ORDER — OXYCODONE-ACETAMINOPHEN 7.5-325 MG PO TABS: 1.0000 | ORAL_TABLET | Freq: Two times a day (BID) | ORAL | 0 refills | Status: DC

## 2016-06-04 NOTE — Telephone Encounter (Signed)
Patient stated that she didn't need it she just wanted to get it because she was going to be in [place].  However when I told her we could post-date it so she could pick it up she said, well I don't know when it will be. Maybe Thursday or Friday. Thanks [employer]

## 2016-06-04 NOTE — Telephone Encounter (Signed)
Rx printed and post dated until 06/10/16.  [place] substance registry checked and patient has not filled early, does not pharmacy shop and has only had filled by myself or another provider at this office when I was out.

## 2016-06-25 ENCOUNTER — Ambulatory Visit
Admission: RE | Admit: 2016-06-25 | Discharge: 2016-06-25 | Disposition: A | Discharge status: Home / Self Care | Payer: Disability Insurance | Source: Ambulatory Visit | Attending: Pediatrics | Admitting: Pediatrics

## 2016-06-25 ENCOUNTER — Other Ambulatory Visit: Payer: Self-pay | Admitting: Pediatrics

## 2016-06-25 DIAGNOSIS — M545 Low back pain: Secondary | ICD-10-CM | POA: Diagnosis not present

## 2016-06-25 DIAGNOSIS — M47896 Other spondylosis, lumbar region: Secondary | ICD-10-CM | POA: Insufficient documentation

## 2016-06-25 DIAGNOSIS — M539 Dorsopathy, unspecified: Secondary | ICD-10-CM

## 2016-07-05 ENCOUNTER — Other Ambulatory Visit: Payer: Self-pay | Admitting: Physician Assistant

## 2016-07-05 DIAGNOSIS — R609 Edema, unspecified: Secondary | ICD-10-CM

## 2016-07-08 ENCOUNTER — Telehealth: Payer: Self-pay | Admitting: Physician Assistant

## 2016-07-08 MED ORDER — ALBUTEROL SULFATE HFA 108 (90 BASE) MCG/ACT IN AERS: 2.0000 | INHALATION_SPRAY | RESPIRATORY_TRACT | 5 refills | Status: DC | PRN

## 2016-07-08 NOTE — Telephone Encounter (Signed)
[facility] pharmacy faxed a request on the following medication.  Thanks CC  albuterol (PROVENTIL HFA;VENTOLIN HFA) 108 (90 BASE) MCG/ACT inhaler  Inhale 2 puffs into the lings every 4 hours as needed for wheezing or shortness of breath.

## 2016-07-08 NOTE — Telephone Encounter (Signed)
LOV 05/10/2016. Last refill 01/01/2016. Renaldo Fiddler, CMA

## 2016-07-09 ENCOUNTER — Other Ambulatory Visit: Payer: Self-pay

## 2016-07-09 DIAGNOSIS — M778 Other enthesopathies, not elsewhere classified: Secondary | ICD-10-CM

## 2016-07-09 NOTE — Telephone Encounter (Signed)
Patient is requesting refill for the following medication: Oxycodone-Acetaminophen.  Thanks,  -Annslee Tercero

## 2016-07-10 ENCOUNTER — Ambulatory Visit (INDEPENDENT_AMBULATORY_CARE_PROVIDER_SITE_OTHER): Payer: Medicare Other | Admitting: Physician Assistant

## 2016-07-10 ENCOUNTER — Encounter: Payer: Self-pay | Admitting: Physician Assistant

## 2016-07-10 VITALS — BP 106/62 | HR 68 | Temp 98.0°F | Resp 16 | Wt 216.0 lb

## 2016-07-10 DIAGNOSIS — R42 Dizziness and giddiness: Secondary | ICD-10-CM

## 2016-07-10 DIAGNOSIS — Z20828 Contact with and (suspected) exposure to other viral communicable diseases: Secondary | ICD-10-CM

## 2016-07-10 DIAGNOSIS — R112 Nausea with vomiting, unspecified: Secondary | ICD-10-CM

## 2016-07-10 DIAGNOSIS — E755 Other lipid storage disorders: Secondary | ICD-10-CM

## 2016-07-10 DIAGNOSIS — H01004 Unspecified blepharitis left upper eyelid: Secondary | ICD-10-CM | POA: Diagnosis not present

## 2016-07-10 DIAGNOSIS — Z6839 Body mass index (BMI) 39.0-39.9, adult: Secondary | ICD-10-CM

## 2016-07-10 DIAGNOSIS — R358 Other polyuria: Secondary | ICD-10-CM | POA: Diagnosis not present

## 2016-07-10 DIAGNOSIS — Z833 Family history of diabetes mellitus: Secondary | ICD-10-CM | POA: Diagnosis not present

## 2016-07-10 DIAGNOSIS — R3589 Other polyuria: Secondary | ICD-10-CM

## 2016-07-10 DIAGNOSIS — R631 Polydipsia: Secondary | ICD-10-CM

## 2016-07-10 LAB — POCT GLYCOSYLATED HEMOGLOBIN (HGB A1C)
Est. average glucose Bld gHb Est-mCnc: 114
Hemoglobin A1C: 5.6

## 2016-07-10 MED ORDER — OXYCODONE-ACETAMINOPHEN 7.5-325 MG PO TABS: 1.0000 | ORAL_TABLET | Freq: Two times a day (BID) | ORAL | 0 refills | Status: DC

## 2016-07-10 MED ORDER — BACITRACIN 500 UNIT/GM OP OINT: 1.0000 "application " | TOPICAL_OINTMENT | Freq: Four times a day (QID) | OPHTHALMIC | 0 refills | Status: DC

## 2016-07-10 MED ORDER — OSELTAMIVIR PHOSPHATE 75 MG PO CAPS: 75.0000 mg | ORAL_CAPSULE | Freq: Every day | ORAL | 0 refills | Status: DC

## 2016-07-10 NOTE — Patient Instructions (Addendum)
Blepharitis Blepharitis means swollen eyelids. Follow these instructions at home: Pay attention to any changes in how you look or feel. Follow these instructions to help with your condition: Keeping Clean   Wash your hands often.  Wash your eyelids with warm water, or wash them with warm water that is mixed with little bit of baby shampoo. Do this 2 or more times per day.  Wash your face and eyebrows at least once a day.  Use a clean towel each time you dry your eyelids. Do not use the towel to clean or dry other areas of your body. Do not share your towel with anyone. General instructions   Avoid wearing makeup until you get better. Do not share makeup with anyone.  Avoid rubbing your eyes.  Put a warm compress on your eyes 2 times per day for 10 minutes at a time or as told by your doctor.  If you were told to use an medicated cream or eye drops, use the medicine as told by your doctor. Do not stop using the medicine even if you feel better.  Keep all follow-up visits as told by your doctor. This is important. Contact a doctor if:  Your eyelids feel hot.  You have blisters on your eyelids.  You have a rash on your eyelids.  The swelling does not go away in 2-4 days.  The swelling gets worse. Get help right away if:  You have pain that gets worse.  You have pain that spreads to other parts of your face.  You have redness that gets worse.  You have redness that spreads to other parts of your face.  Your vision changes.  You have pain when you look at lights or things that move.  You have a fever. This information is not intended to replace advice given to you by your health care provider. Make sure you discuss any questions you have with your health care provider. Document Released: 12/05/2007 Document Revised: 08/03/2015 Document Reviewed: 06/20/2014 Elsevier Interactive Patient Education  2017 [employer] DASH stands for "Dietary  Approaches to Stop Hypertension." The DASH eating plan is a healthy eating plan that has been shown to reduce high blood pressure (hypertension). It may also reduce your risk for type 2 diabetes, heart disease, and stroke. The DASH eating plan may also help with weight loss. What are tips for following this plan? General guidelines   Avoid eating more than 2,300 mg (milligrams) of salt (sodium) a day. If you have hypertension, you may need to reduce your sodium intake to 1,500 mg a day.  Limit alcohol intake to no more than 1 drink a day for nonpregnant women and 2 drinks a day for men. One drink equals 12 oz of beer, 5 oz of wine, or 1 oz of hard liquor.  Work with your health care provider to maintain a healthy body weight or to lose weight. Ask what an ideal weight is for you.  Get at least 30 minutes of exercise that causes your heart to beat faster (aerobic exercise) most days of the week. Activities may include walking, swimming, or biking.  Work with your health care provider or diet and nutrition specialist (dietitian) to adjust your eating plan to your individual calorie needs. Reading food labels   Check food labels for the amount of sodium per serving. Choose foods with less than 5 percent of the Daily Value of sodium. Generally, foods with less than 300 mg of sodium  per serving fit into this eating plan.  To find whole grains, look for the word "whole" as the first word in the ingredient list. Shopping   Buy products labeled as "low-sodium" or "no salt added."  Buy fresh foods. Avoid canned foods and premade or frozen meals. Cooking   Avoid adding salt when cooking. Use salt-free seasonings or herbs instead of table salt or sea salt. Check with your health care provider or pharmacist before using salt substitutes.  Do not fry foods. Cook foods using healthy methods such as baking, boiling, grilling, and broiling instead.  Cook with heart-healthy oils, such as olive, canola,  soybean, or sunflower oil. Meal planning    Eat a balanced diet that includes:  5 or more servings of fruits and vegetables each day. At each meal, try to fill half of your plate with fruits and vegetables.  Up to 6-8 servings of whole grains each day.  Less than 6 oz of lean meat, poultry, or fish each day. A 3-oz serving of meat is about the same size as a deck of cards. One egg equals 1 oz.  2 servings of low-fat dairy each day.  A serving of nuts, seeds, or beans 5 times each week.  Heart-healthy fats. Healthy fats called Omega-3 fatty acids are found in foods such as flaxseeds and coldwater fish, like sardines, salmon, and mackerel.  Limit how much you eat of the following:  Canned or prepackaged foods.  Food that is high in trans fat, such as fried foods.  Food that is high in saturated fat, such as fatty meat.  Sweets, desserts, sugary drinks, and other foods with added sugar.  Full-fat dairy products.  Do not salt foods before eating.  Try to eat at least 2 vegetarian meals each week.  Eat more home-cooked food and less restaurant, buffet, and fast food.  When eating at a restaurant, ask that your food be prepared with less salt or no salt, if possible. What foods are recommended? The items listed may not be a complete list. Talk with your dietitian about what dietary choices are best for you. Grains  Whole-grain or whole-wheat bread. Whole-grain or whole-wheat pasta. Brown rice. [place]. Bulgur. Whole-grain and low-sodium cereals. Pita bread. Low-fat, low-sodium crackers. Whole-wheat flour tortillas. Vegetables  Fresh or frozen vegetables (raw, steamed, roasted, or grilled). Low-sodium or reduced-sodium tomato and vegetable juice. Low-sodium or reduced-sodium tomato sauce and tomato paste. Low-sodium or reduced-sodium canned vegetables. Fruits  All fresh, dried, or frozen fruit. Canned fruit in natural juice (without added sugar). Meat and other protein  foods  Skinless chicken or [place]. Ground chicken or [place]. Pork with fat trimmed off. Fish and seafood. Egg whites. Dried beans, peas, or lentils. Unsalted nuts, nut butters, and seeds. Unsalted canned beans. Lean cuts of beef with fat trimmed off. Low-sodium, lean deli meat. Dairy  Low-fat (1%) or fat-free (skim) milk. Fat-free, low-fat, or reduced-fat cheeses. Nonfat, low-sodium ricotta or cottage cheese. Low-fat or nonfat yogurt. Low-fat, low-sodium cheese. Fats and oils  Soft margarine without trans fats. Vegetable oil. Low-fat, reduced-fat, or light mayonnaise and salad dressings (reduced-sodium). Canola, safflower, olive, soybean, and sunflower oils. Avocado. Seasoning and other foods  Herbs. Spices. Seasoning mixes without salt. Unsalted popcorn and pretzels. Fat-free sweets. What foods are not recommended? The items listed may not be a complete list. Talk with your dietitian about what dietary choices are best for you. Grains  Baked goods made with fat, such as croissants, muffins, or some breads.  Dry pasta or rice meal packs. Vegetables  Creamed or fried vegetables. Vegetables in a cheese sauce. Regular canned vegetables (not low-sodium or reduced-sodium). Regular canned tomato sauce and paste (not low-sodium or reduced-sodium). Regular tomato and vegetable juice (not low-sodium or reduced-sodium). Angie Fava. Olives. Fruits  Canned fruit in a light or heavy syrup. Fried fruit. Fruit in cream or butter sauce. Meat and other protein foods  Fatty cuts of meat. Ribs. Fried meat. Berniece Salines. Sausage. Bologna and other processed lunch meats. Salami. Fatback. Hotdogs. Bratwurst. Salted nuts and seeds. Canned beans with added salt. Canned or smoked fish. Whole eggs or egg yolks. Chicken or [place] with skin. Dairy  Whole or 2% milk, cream, and half-and-half. Whole or full-fat cream cheese. Whole-fat or sweetened yogurt. Full-fat cheese. Nondairy creamers. Whipped toppings. Processed cheese and cheese  spreads. Fats and oils  Butter. Stick margarine. Lard. Shortening. Ghee. Bacon fat. Tropical oils, such as coconut, palm kernel, or palm oil. Seasoning and other foods  Salted popcorn and pretzels. Onion salt, garlic salt, seasoned salt, table salt, and sea salt. Worcestershire sauce. Tartar sauce. Barbecue sauce. Teriyaki sauce. Soy sauce, including reduced-sodium. Steak sauce. Canned and packaged gravies. Fish sauce. Oyster sauce. Cocktail sauce. Horseradish that you find on the shelf. Ketchup. Mustard. Meat flavorings and tenderizers. Bouillon cubes. Hot sauce and Tabasco sauce. Premade or packaged marinades. Premade or packaged taco seasonings. Relishes. Regular salad dressings. Where to find more information:  National Heart, Lung, and [place]: https://wilson-eaton.com/  American Heart Association: www.heart.org Summary  The DASH eating plan is a healthy eating plan that has been shown to reduce high blood pressure (hypertension). It may also reduce your risk for type 2 diabetes, heart disease, and stroke.  With the DASH eating plan, you should limit salt (sodium) intake to 2,300 mg a day. If you have hypertension, you may need to reduce your sodium intake to 1,500 mg a day.  When on the DASH eating plan, aim to eat more fresh fruits and vegetables, whole grains, lean proteins, low-fat dairy, and heart-healthy fats.  Work with your health care provider or diet and nutrition specialist (dietitian) to adjust your eating plan to your individual calorie needs. This information is not intended to replace advice given to you by your health care provider. Make sure you discuss any questions you have with your health care provider. Document Released: 02/14/2011 Document Revised: 02/19/2016 Document Reviewed: 02/19/2016 Elsevier Interactive Patient Education  2017 [employer] Dyslipidemia Dyslipidemia is an imbalance of waxy, fat-like substances (lipids) in the blood. The body needs lipids in  small amounts. Dyslipidemia often involves a high level of cholesterol or triglycerides, which are types of lipids. Common forms of dyslipidemia include:  High levels of bad cholesterol (LDL cholesterol). LDL is the type of cholesterol that causes fatty deposits (plaques) to build up in the blood vessels that carry blood away from your heart (arteries).  Low levels of good cholesterol (HDL cholesterol). HDL cholesterol is the type of cholesterol that protects against heart disease. High levels of HDL remove the LDL buildup from arteries.  High levels of triglycerides. Triglycerides are a fatty substance in the blood that is linked to a buildup of plaques in the arteries. You can develop dyslipidemia because of the genes you are born with (primary dyslipidemia) or changes that occur during your life (secondary dyslipidemia), or as a side effect of certain medical treatments. What are the causes? Primary dyslipidemia is caused by changes (mutations) in genes that are passed down through families (  inherited). These mutations cause several types of dyslipidemia. Mutations can result in disorders that make the body produce too much LDL cholesterol or triglycerides, or not enough HDL cholesterol. These disorders may lead to heart disease, arterial disease, or stroke at an early age. Causes of secondary dyslipidemia include certain lifestyle choices and diseases that lead to dyslipidemia, such as:  Eating a diet that is high in animal fat.  Not getting enough activity or exercise (having a sedentary lifestyle).  Having diabetes, kidney disease, liver disease, or thyroid disease.  Drinking large amounts of alcohol.  Using certain types of drugs. What increases the risk? You may be at greater risk for dyslipidemia if you are an older man or if you are a woman who has gone through menopause. Other risk factors include:  Having a family history of dyslipidemia.  Taking certain medicines, including  birth control pills, steroids, some diuretics, beta-blockers, and some medicines forHIV.  Smoking cigarettes.  Eating a high-fat diet.  Drinking large amounts of alcohol.  Having certain medical conditions such as diabetes, polycystic ovary syndrome (PCOS), pregnancy, kidney disease, liver disease, or hypothyroidism.  Not exercising regularly.  Being overweight or obese with too much belly fat. What are the signs or symptoms? Dyslipidemia does not usually cause any symptoms. Very high lipid levels can cause fatty bumps under the skin (xanthomas) or a white or gray ring around the black center (pupil) of the eye. Very high triglyceride levels can cause inflammation of the pancreas (pancreatitis). How is this diagnosed? Your health care provider may diagnose dyslipidemia based on a routine blood test (fasting blood test). Because most people do not have symptoms of the condition, this blood testing (lipid profile) is done on adults age 69 and older and is repeated every 5 years. This test checks:  Total cholesterol. This is a measure of the total amount of cholesterol in your blood, including LDL cholesterol, HDL cholesterol, and triglycerides. A healthy number is below 200.  LDL cholesterol. The target number for LDL cholesterol is different for each person, depending on individual risk factors. For most people, a number below 100 is healthy. Ask your health care provider what your LDL cholesterol number should be.  HDL cholesterol. An HDL level of 60 or higher is best because it helps to protect against heart disease. A number below 10 for men or below 30 for women increases the risk for heart disease.  Triglycerides. A healthy triglyceride number is below 150. If your lipid profile is abnormal, your health care provider may do other blood tests to get more information about your condition. How is this treated? Treatment depends on the type of dyslipidemia that you have and your other  risk factors for heart disease and stroke. Your health care provider will have a target range for your lipid levels based on this information. For many people, treatment starts with lifestyle changes, such as diet and exercise. Your health care provider may recommend that you:  Get regular exercise.  Make changes to your diet.  Quit smoking if you smoke. If diet changes and exercise do not help you reach your goals, your health care provider may also prescribe medicine to lower lipids. The most commonly prescribed type of medicine lowers your LDL cholesterol (statin drug). If you have a high triglyceride level, your provider may prescribe another type of drug (fibrate) or an omega-3 fish oil supplement, or both. Follow these instructions at home:  Take over-the-counter and prescription medicines only as told by  your health care provider. This includes supplements.  Get regular exercise. Start an aerobic exercise and strength training program as told by your health care provider. Ask your health care provider what activities are safe for you. Your health care provider may recommend:  30 minutes of aerobic activity 4-6 days a week. Brisk walking is an example of aerobic activity.  Strength training 2 days a week.  Eat a healthy diet as told by your health care provider. This can help you reach and maintain a healthy weight, lower your LDL cholesterol, and raise your HDL cholesterol. It may help to work with a diet and nutrition specialist (dietitian) to make a plan that is right for you. Your dietitian or health care provider may recommend:  Limiting your calories, if you are overweight.  Eating more fruits, vegetables, whole grains, fish, and lean meats.  Limiting saturated fat, trans fat, and cholesterol.  Follow instructions from your health care provider or dietitian about eating or drinking restrictions.  Limit alcohol intake to no more than one drink per day for nonpregnant women and  two drinks per day for men. One drink equals 12 oz of beer, 5 oz of wine, or 1 oz of hard liquor.  Do not use any products that contain nicotine or tobacco, such as cigarettes and e-cigarettes. If you need help quitting, ask your health care provider.  Keep all follow-up visits as told by your health care provider. This is important. Contact a health care provider if:  You are having trouble sticking to your exercise or diet plan.  You are struggling to quit smoking or control your use of alcohol. Summary  Dyslipidemia is an imbalance of waxy, fat-like substances (lipids) in the blood. The body needs lipids in small amounts. Dyslipidemia often involves a high level of cholesterol or triglycerides, which are types of lipids.  Treatment depends on the type of dyslipidemia that you have and your other risk factors for heart disease and stroke.  For many people, treatment starts with lifestyle changes, such as diet and exercise. Your health care provider may also prescribe medicine to lower lipids. This information is not intended to replace advice given to you by your health care provider. Make sure you discuss any questions you have with your health care provider. Document Released: 03/02/2013 Document Revised: 10/23/2015 Document Reviewed: 10/23/2015 Elsevier Interactive Patient Education  2017 [employer].

## 2016-07-10 NOTE — Telephone Encounter (Signed)
Patient has appt today at 1130 so we can give patient Rx then. [place] Substance abuse registry checked and no red flags noted.

## 2016-07-10 NOTE — Progress Notes (Signed)
Patient: Kathleen Buchanan Female    DOB: [birth year]   51 y.o.   MRN: [id number] Visit Date: 07/10/2016  Today's Provider: Mar Daring, PA-C   Chief Complaint  Patient presents with  . Eye Problem   Subjective:    Eye Problem   The right eye is affected. This is a new problem. The current episode started 1 to 4 weeks ago (x 2 weeks). The problem has been waxing and waning. There was no injury mechanism. The pain is at a severity of 0/10 (can get up to an 8/10). There is no known exposure to pink eye. She does not wear contacts. Associated symptoms include an eye discharge, eye redness, itching, nausea and photophobia. Pertinent negatives include no blurred vision, double vision, fever, foreign body sensation or vomiting.  Pt believes she has a stye in the right eye. Pt reports her eye was so swollen at onset that she could barely open her eye. Pt threw her old eye makeup out and bought new, without relief.  Pt also states that her daughter was diagnosed with the flu, and pt is requesting a Tamiflu rx.  Pt is also c/o polydipsia, polyuria and visual disturbances. Pt has a family H/O diabetes.    Allergies  Allergen Reactions  . Codeine Hives  . Erythromycin   . Hydrocodone-Acetaminophen Hives  . Lansoprazole   . Levofloxacin   . Migraine Formula  [Aspirin-Acetaminophen-Caffeine]   . Morphine Sulfate   . Omeprazole   . Pepcid  [Famotidine]   . Sulfa Antibiotics      Current Outpatient Prescriptions:  .  albuterol (PROVENTIL HFA;VENTOLIN HFA) 108 (90 Base) MCG/ACT inhaler, Inhale 2 puffs into the lungs every 4 (four) hours as needed for wheezing or shortness of breath., Disp: 1 Inhaler, Rfl: 5 .  ALPRAZolam (XANAX) 1 MG tablet, Take 1 tablet (1 mg total) by mouth 2 (two) times daily., Disp: 60 tablet, Rfl: 5 .  amphetamine-dextroamphetamine (ADDERALL) 30 MG tablet, Take 1 tablet by mouth 2 (two) times daily. Please do not 07/08/16, Disp: 60 tablet, Rfl: 0 .   cyclobenzaprine (FLEXERIL) 5 MG tablet, Take 1 tablet (5 mg total) by mouth 3 (three) times daily as needed for muscle spasms., Disp: 30 tablet, Rfl: 1 .  hydrochlorothiazide (HYDRODIURIL) 12.5 MG tablet, TAKE 1 TABLET BY MOUTH DAILY. (Patient taking differently: TAKE 1 TABLET BY MOUTH PRN FOR EDEMA), Disp: 90 tablet, Rfl: 1 .  ibuprofen (ADVIL,MOTRIN) 200 MG tablet, Take 4 tablets by mouth as needed., Disp: , Rfl:  .  levothyroxine (SYNTHROID, LEVOTHROID) 75 MCG tablet, Take 1 tablet (75 mcg total) by mouth daily., Disp: 90 tablet, Rfl: 3 .  oxyCODONE-acetaminophen (ENDOCET) 7.5-325 MG tablet, Take 1 tablet by mouth 2 (two) times daily., Disp: 60 tablet, Rfl: 0 .  pantoprazole (PROTONIX) 40 MG tablet, Take 1 tablet (40 mg total) by mouth daily., Disp: 30 tablet, Rfl: 5 .  ascorbic acid (VITAMIN C) 500 MG tablet, Take 1,000 mg by mouth daily. , Disp: , Rfl:  .  MULTIPLE VITAMIN PO, Take 1 tablet by mouth daily., Disp: , Rfl:   Review of Systems  Constitutional: Negative for fatigue and fever.  HENT: Negative for congestion, ear pain, postnasal drip, sinus pain, sinus pressure, sore throat, tinnitus and trouble swallowing.   Eyes: Positive for photophobia, discharge, redness, itching and visual disturbance. Negative for blurred vision and double vision.  Respiratory: Negative for cough, chest tightness, shortness of breath and wheezing.  Cardiovascular: Negative for chest pain, palpitations and leg swelling.  Gastrointestinal: Positive for nausea. Negative for abdominal pain and vomiting.  Endocrine: Positive for polydipsia and polyuria. Negative for cold intolerance, heat intolerance and polyphagia.  Neurological: Positive for headaches. Negative for dizziness.    Social History  Substance Use Topics  . Smoking status: Current Every Day Smoker    Packs/day: 0.25    Types: Cigarettes  . Smokeless tobacco: Never Used     Comment: less than a 1/2 a pack a day  . Alcohol use No   Objective:    BP 106/62 (BP Location: Right Arm, Patient Position: Sitting, Cuff Size: Large)   Pulse 68   Temp 98 F (36.7 C) (Oral)   Resp 16   Wt 216 lb (98 kg)   BMI 39.51 kg/m  Vitals:   07/10/16 1127  BP: 106/62  Pulse: 68  Resp: 16  Temp: 98 F (36.7 C)  TempSrc: Oral  Weight: 216 lb (98 kg)    Physical Exam  Constitutional: She appears well-developed and well-nourished. No distress.  Eyes: EOM are normal. Pupils are equal, round, and reactive to light. Right eye exhibits no chemosis, no discharge, no exudate and no hordeolum. No foreign body present in the right eye. Left eye exhibits chemosis. Left eye exhibits no discharge, no exudate and no hordeolum. No foreign body present in the left eye. Right conjunctiva is injected. Left conjunctiva is injected. No scleral icterus.  Mild swelling of left upper eyelid. Small nodule noted on the interior upper eyelid near the medial commisure.   Small xanthoma noted on left upper eyelid at medial commisure side  Neck: Normal range of motion. Neck supple. No JVD present. No tracheal deviation present. No thyromegaly present.  Cardiovascular: Normal rate, regular rhythm and normal heart sounds.  Exam reveals no gallop and no friction rub.   No murmur heard. Pulmonary/Chest: Effort normal and breath sounds normal. No respiratory distress. She has no wheezes. She has no rales.  Musculoskeletal: She exhibits no edema.  Lymphadenopathy:    She has no cervical adenopathy.  Skin: She is not diaphoretic.  Vitals reviewed.      Assessment & Plan:     1. Blepharitis of left upper eyelid, unspecified type Discussed eye care, discarding make up she is now using as it probably got infected with using on infected eye, warm compresses.Advised to not wear eye make up until infection was cleared. Bacitracin ointment given as below. She has had these episodes before. Advised patient if she continues to develop may need appt with eye doctor to discuss causes.   - bacitracin ophthalmic ointment; Place 1 application into the left eye 4 (four) times daily.  Dispense: 3.5 g; Refill: 0  2. Polydipsia Suspect secondary to medications. Will check A1c to make sure not onset diabetes since patient is obese and has strong family history. A1c was 5.6 today.  - POCT HgB A1C  3. Polyuria See above medical treatment plan. - POCT HgB A1C  4. Family history of diabetes mellitus In her mother. - POCT HgB A1C  5. Dizziness Suspect secondary to seasonal allergies. Not occurring currently.  - POCT HgB A1C  6. Exposure to the flu Daughter diagnosed on Thursday 07/04/16. She has not shown any symptoms thus far. Prophylactic tamiflu sent in.  - oseltamivir (TAMIFLU) 75 MG capsule; Take 1 capsule (75 mg total) by mouth daily.  Dispense: 10 capsule; Refill: 0  7. Xanthoma Noted on left upper eyelid near medial  commisure. On statin.  8. BMI 39.0-39.9,adult Does not exercise or eat a healthy diet. Counseled patient on healthy lifestyle modifications including dieting and exercise.   9. Nausea and vomiting, intractability of vomiting not specified, unspecified vomiting type While in the office patient developed nausea with vomiting x 1. She reported she felt better after vomiting. No fever. States she had felt nauseated since she ate breakfast. Did not wake up nauseous. Advised to call if symptoms worsen. Bland diet today. Stay hydrated.        Mar Daring, PA-C  [facility]

## 2016-08-13 ENCOUNTER — Encounter: Payer: Self-pay | Admitting: Physician Assistant

## 2016-08-13 ENCOUNTER — Telehealth: Payer: Self-pay | Admitting: Physician Assistant

## 2016-08-13 ENCOUNTER — Ambulatory Visit (INDEPENDENT_AMBULATORY_CARE_PROVIDER_SITE_OTHER): Payer: Medicare Other | Admitting: Physician Assistant

## 2016-08-13 VITALS — BP 110/68 | HR 64 | Temp 97.7°F | Resp 16 | Ht 62.0 in | Wt 215.6 lb

## 2016-08-13 DIAGNOSIS — N951 Menopausal and female climacteric states: Secondary | ICD-10-CM | POA: Diagnosis not present

## 2016-08-13 DIAGNOSIS — F900 Attention-deficit hyperactivity disorder, predominantly inattentive type: Secondary | ICD-10-CM | POA: Diagnosis not present

## 2016-08-13 DIAGNOSIS — M778 Other enthesopathies, not elsewhere classified: Secondary | ICD-10-CM

## 2016-08-13 DIAGNOSIS — B379 Candidiasis, unspecified: Secondary | ICD-10-CM | POA: Diagnosis not present

## 2016-08-13 DIAGNOSIS — F17200 Nicotine dependence, unspecified, uncomplicated: Secondary | ICD-10-CM | POA: Diagnosis not present

## 2016-08-13 DIAGNOSIS — T3695XA Adverse effect of unspecified systemic antibiotic, initial encounter: Secondary | ICD-10-CM

## 2016-08-13 DIAGNOSIS — J0100 Acute maxillary sinusitis, unspecified: Secondary | ICD-10-CM | POA: Diagnosis not present

## 2016-08-13 DIAGNOSIS — R309 Painful micturition, unspecified: Secondary | ICD-10-CM

## 2016-08-13 LAB — POCT URINALYSIS DIPSTICK
BILIRUBIN UA: NEGATIVE
Glucose, UA: NEGATIVE
LEUKOCYTES UA: NEGATIVE
NITRITE UA: NEGATIVE
PROTEIN UA: 30
Spec Grav, UA: 1.02 (ref 1.010–1.025)
Urobilinogen, UA: 0.2 E.U./dL
pH, UA: 6 (ref 5.0–8.0)

## 2016-08-13 MED ORDER — PAROXETINE HCL 10 MG PO TABS: ORAL_TABLET | ORAL | 0 refills | Status: DC

## 2016-08-13 MED ORDER — AMPHETAMINE-DEXTROAMPHETAMINE 30 MG PO TABS: 1.0000 | ORAL_TABLET | Freq: Two times a day (BID) | ORAL | 0 refills | Status: DC

## 2016-08-13 MED ORDER — FLUCONAZOLE 150 MG PO TABS: 150.0000 mg | ORAL_TABLET | Freq: Once | ORAL | 0 refills | Status: AC

## 2016-08-13 MED ORDER — NICOTINE 21 MG/24HR TD PT24: 21.0000 mg | MEDICATED_PATCH | Freq: Every day | TRANSDERMAL | 0 refills | Status: DC

## 2016-08-13 MED ORDER — OXYCODONE-ACETAMINOPHEN 7.5-325 MG PO TABS: 1.0000 | ORAL_TABLET | Freq: Two times a day (BID) | ORAL | 0 refills | Status: DC

## 2016-08-13 MED ORDER — AMOXICILLIN-POT CLAVULANATE 875-125 MG PO TABS: 1.0000 | ORAL_TABLET | Freq: Two times a day (BID) | ORAL | 0 refills | Status: DC

## 2016-08-13 NOTE — Patient Instructions (Signed)

## 2016-08-13 NOTE — Telephone Encounter (Signed)
Please review, note is not done yet-aa

## 2016-08-13 NOTE — Telephone Encounter (Signed)
This has been sent in  

## 2016-08-13 NOTE — Telephone Encounter (Signed)
Pt stated that her OV today she had discussed taking Paxil for hot flashes and thought Tawanna Sat was going to send that into [facility]. [facility] and was advised [facility] had not [facility] an Rx for Paxil. Pt is requesting the Rx be sent today if possible. Please advise. Thanks TNP

## 2016-08-13 NOTE — Telephone Encounter (Signed)
Pt advised. Kathleen Buchanan, CMA  

## 2016-08-13 NOTE — Progress Notes (Signed)
Patient: Kathleen Buchanan Female    DOB: 27-Feb-1966   51 y.o.   MRN: [id number] Visit Date: 08/13/2016  Today's Provider: Mar Daring, PA-C   Chief Complaint  Patient presents with  . URI  . Urinary Tract Infection   Subjective:    HPI  Upper Respiratory Infection: Patient complains of symptoms of a URI. Symptoms include congestion, cough and plugged sensation in both ears. Onset of symptoms was 2 weeks ago, gradually improving since that time. She also c/o post nasal drip and productive cough with  green colored sputum for the past 2 weeks .  She is drinking plenty of fluids. Evaluation to date: none. Treatment to date: cough suppressants and decongestants.   Urinary Tract Infection: Patient complains of burning with urination and pain in the lower abdomen She has had symptoms for 2 days. Patient also complains of vaginal discharge. Patient denies fever. Patient does not have a history of recurrent UTI.  Patient does have a history of pyelonephritis.   Hot Flashes: Complains of new onset hot flashes over the last 6 months that have been worsening in intensity since onset. She reports sometimes she feels as if she is burning on the inside and feels she is freezing out the rest of her family because the air is so low.     Allergies  Allergen Reactions  . Codeine Hives  . Erythromycin   . Hydrocodone-Acetaminophen Hives  . Lansoprazole   . Levofloxacin   . Migraine Formula  [Aspirin-Acetaminophen-Caffeine]   . Morphine Sulfate   . Omeprazole   . Pepcid  [Famotidine]   . Sulfa Antibiotics      Current Outpatient Prescriptions:  .  albuterol (PROVENTIL HFA;VENTOLIN HFA) 108 (90 Base) MCG/ACT inhaler, Inhale 2 puffs into the lungs every 4 (four) hours as needed for wheezing or shortness of breath., Disp: 1 Inhaler, Rfl: 5 .  ALPRAZolam (XANAX) 1 MG tablet, Take 1 tablet (1 mg total) by mouth 2 (two) times daily., Disp: 60 tablet, Rfl: 5 .   amphetamine-dextroamphetamine (ADDERALL) 30 MG tablet, Take 1 tablet by mouth 2 (two) times daily. Please do not 07/08/16, Disp: 60 tablet, Rfl: 0 .  ascorbic acid (VITAMIN C) 500 MG tablet, Take 1,000 mg by mouth daily. , Disp: , Rfl:  .  cyclobenzaprine (FLEXERIL) 5 MG tablet, Take 1 tablet (5 mg total) by mouth 3 (three) times daily as needed for muscle spasms., Disp: 30 tablet, Rfl: 1 .  hydrochlorothiazide (HYDRODIURIL) 12.5 MG tablet, TAKE 1 TABLET BY MOUTH DAILY. (Patient taking differently: TAKE 1 TABLET BY MOUTH PRN FOR EDEMA), Disp: 90 tablet, Rfl: 1 .  ibuprofen (ADVIL,MOTRIN) 200 MG tablet, Take 4 tablets by mouth as needed., Disp: , Rfl:  .  levothyroxine (SYNTHROID, LEVOTHROID) 75 MCG tablet, Take 1 tablet (75 mcg total) by mouth daily., Disp: 90 tablet, Rfl: 3 .  oxyCODONE-acetaminophen (ENDOCET) 7.5-325 MG tablet, Take 1 tablet by mouth 2 (two) times daily., Disp: 60 tablet, Rfl: 0 .  pantoprazole (PROTONIX) 40 MG tablet, Take 1 tablet (40 mg total) by mouth daily., Disp: 30 tablet, Rfl: 5  Review of Systems  Constitutional: Positive for diaphoresis. Negative for activity change, appetite change, chills, fatigue and fever.  HENT: Positive for congestion, ear pain (fullness), postnasal drip, sinus pain and sore throat. Negative for ear discharge, rhinorrhea, sinus pressure, sneezing, tinnitus, trouble swallowing and voice change.   Respiratory: Positive for cough. Negative for chest tightness, shortness of breath and wheezing.  Cardiovascular: Negative.   Gastrointestinal: Positive for abdominal pain.  Genitourinary: Positive for difficulty urinating and flank pain.  Neurological: Positive for headaches. Negative for dizziness.    Social History  Substance Use Topics  . Smoking status: Current Every Day Smoker    Packs/day: 0.25    Types: Cigarettes  . Smokeless tobacco: Never Used     Comment: less than a 1/2 a pack a day  . Alcohol use No   Objective:   BP 110/68 (BP  Location: Left Arm, Patient Position: Sitting, Cuff Size: Large)   Pulse 64   Temp 97.7 F (36.5 C) (Oral)   Resp 16   Ht 5\' 2"  (1.575 m)   Wt 215 lb 9.6 oz (97.8 kg)   SpO2 98%   BMI 39.43 kg/m  Vitals:   08/13/16 1108  BP: 110/68  Pulse: 64  Resp: 16  Temp: 97.7 F (36.5 C)  TempSrc: Oral  SpO2: 98%  Weight: 215 lb 9.6 oz (97.8 kg)  Height: 5\' 2"  (1.575 m)    Physical Exam  Constitutional: She appears well-developed and well-nourished. No distress.  HENT:  Head: Normocephalic and atraumatic.  Right Ear: Hearing, external ear and ear canal normal. Tympanic membrane is bulging. A middle ear effusion (clear) is present.  Left Ear: Hearing, external ear and ear canal normal. Tympanic membrane is bulging. A middle ear effusion (clear) is present.  Nose: Mucosal edema and rhinorrhea present. Right sinus exhibits maxillary sinus tenderness. Right sinus exhibits no frontal sinus tenderness. Left sinus exhibits maxillary sinus tenderness. Left sinus exhibits no frontal sinus tenderness.  Mouth/Throat: Uvula is midline and mucous membranes are normal. Posterior oropharyngeal erythema present. No oropharyngeal exudate or posterior oropharyngeal edema.  Neck: Normal range of motion. Neck supple. No tracheal deviation present. No thyromegaly present.  Cardiovascular: Normal rate, regular rhythm and normal heart sounds.  Exam reveals no gallop and no friction rub.   No murmur heard. Pulmonary/Chest: Effort normal and breath sounds normal. No stridor. No respiratory distress. She has no wheezes. She has no rales.  Lymphadenopathy:    She has no cervical adenopathy.  Skin: She is not diaphoretic.  Vitals reviewed.     Assessment & Plan:     1. Acute maxillary sinusitis, recurrence not specified Worsening symptoms that have not responded to OTC medications. Will give augmentin as below. Continue allergy medications. Stay well hydrated and get plenty of rest. Call if no symptom  improvement or if symptoms worsen. - amoxicillin-clavulanate (AUGMENTIN) 875-125 MG tablet; Take 1 tablet by mouth 2 (two) times daily.  Dispense: 20 tablet; Refill: 0  2. Painful urination UA was fairly unremarkable. Will send in urine for culture. I will f/u pending C&S results.  - POCT urinalysis dipstick - Urine Culture  3. Tendinitis of wrist Stable. Diagnosis pulled for medication refill. Continue current medical treatment plan. - oxyCODONE-acetaminophen (ENDOCET) 7.5-325 MG tablet; Take 1 tablet by mouth 2 (two) times daily.  Dispense: 60 tablet; Refill: 0  4. Attention deficit hyperactivity disorder (ADHD), predominantly inattentive type Stable. Diagnosis pulled for medication refill. Continue current medical treatment plan. Refilled c3. Not due for refill until 11/2016. - amphetamine-dextroamphetamine (ADDERALL) 30 MG tablet; Take 1 tablet by mouth 2 (two) times daily. Please do NOT fill until 10/10/16  Dispense: 60 tablet; Refill: 0  5. Tobacco dependence Patient is trying to quit smoking again. Advised of quit resources and Rx for patches sent in as below.  - nicotine (NICODERM CQ - DOSED IN MG/24 HOURS)  21 mg/24hr patch; Place 1 patch (21 mg total) onto the skin daily.  Dispense: 28 patch; Refill: 0  6. Antibiotic-induced yeast infection Sent in diflucan prophylactically for antibiotic induced yeast infection.  - fluconazole (DIFLUCAN) 150 MG tablet; Take 1 tablet (150 mg total) by mouth once. May repeat 48-72 hours if needed  Dispense: 3 tablet; Refill: 0  7. Menopausal syndrome (hot flashes) Discussed in office and sent in paroxetine 10mg  to take 1/2 tab PO daily and increase to 1 tab PO daily if needed for better symptom control.        Mar Daring, PA-C  [facility]

## 2016-09-05 ENCOUNTER — Telehealth: Payer: Self-pay | Admitting: Physician Assistant

## 2016-09-05 DIAGNOSIS — J0100 Acute maxillary sinusitis, unspecified: Secondary | ICD-10-CM

## 2016-09-05 MED ORDER — DOXYCYCLINE HYCLATE 100 MG PO TABS: 100.0000 mg | ORAL_TABLET | Freq: Two times a day (BID) | ORAL | 0 refills | Status: DC

## 2016-09-05 NOTE — Telephone Encounter (Signed)
Patient advised.

## 2016-09-05 NOTE — Telephone Encounter (Signed)
Pt was in about a week or two ago.  She was given an antibiotic.  She has finished the antibiotic and is still having symptoms of the sinus infection.  Please advise.  She ask for another antibiotic.  [facility] uses [facility].  Pt call [phone]  Thanks Con Memos

## 2016-09-05 NOTE — Telephone Encounter (Signed)
Doxycycline sent to [facility]

## 2016-09-09 ENCOUNTER — Telehealth: Payer: Self-pay | Admitting: Physician Assistant

## 2016-09-09 DIAGNOSIS — M778 Other enthesopathies, not elsewhere classified: Secondary | ICD-10-CM

## 2016-09-09 MED ORDER — OXYCODONE-ACETAMINOPHEN 7.5-325 MG PO TABS: 1.0000 | ORAL_TABLET | Freq: Two times a day (BID) | ORAL | 0 refills | Status: DC

## 2016-09-09 NOTE — Telephone Encounter (Signed)
Rx printed. NCSR reviewed and no red flags noted.

## 2016-09-09 NOTE — Telephone Encounter (Signed)
Pt contacted office for refill request on the following medications: oxyCODONE-acetaminophen (ENDOCET) 7.5-325 MG tablet Last Rx: 08/13/16 Please advise. Thanks TNP

## 2016-09-17 ENCOUNTER — Other Ambulatory Visit: Payer: Self-pay | Admitting: Physician Assistant

## 2016-09-17 DIAGNOSIS — N951 Menopausal and female climacteric states: Secondary | ICD-10-CM

## 2016-10-03 ENCOUNTER — Ambulatory Visit (INDEPENDENT_AMBULATORY_CARE_PROVIDER_SITE_OTHER): Payer: Medicare Other | Admitting: Physician Assistant

## 2016-10-03 ENCOUNTER — Encounter: Payer: Self-pay | Admitting: Physician Assistant

## 2016-10-03 VITALS — BP 124/78 | Temp 98.4°F | Resp 16 | Ht 62.0 in | Wt 204.0 lb

## 2016-10-03 DIAGNOSIS — K5903 Drug induced constipation: Secondary | ICD-10-CM | POA: Diagnosis not present

## 2016-10-03 DIAGNOSIS — F322 Major depressive disorder, single episode, severe without psychotic features: Secondary | ICD-10-CM

## 2016-10-03 DIAGNOSIS — E010 Iodine-deficiency related diffuse (endemic) goiter: Secondary | ICD-10-CM | POA: Diagnosis not present

## 2016-10-03 DIAGNOSIS — M778 Other enthesopathies, not elsewhere classified: Secondary | ICD-10-CM | POA: Diagnosis not present

## 2016-10-03 DIAGNOSIS — M26621 Arthralgia of right temporomandibular joint: Secondary | ICD-10-CM | POA: Diagnosis not present

## 2016-10-03 DIAGNOSIS — M722 Plantar fascial fibromatosis: Secondary | ICD-10-CM | POA: Diagnosis not present

## 2016-10-03 MED ORDER — THYROID 30 MG PO TABS: 30.0000 mg | ORAL_TABLET | Freq: Every day | ORAL | 1 refills | Status: DC

## 2016-10-03 MED ORDER — NALOXEGOL OXALATE 25 MG PO TABS: 25.0000 mg | ORAL_TABLET | Freq: Every day | ORAL | 0 refills | Status: DC

## 2016-10-03 MED ORDER — BUPROPION HCL ER (SR) 150 MG PO TB12: 150.0000 mg | ORAL_TABLET | Freq: Two times a day (BID) | ORAL | 1 refills | Status: DC

## 2016-10-03 MED ORDER — OXYCODONE-ACETAMINOPHEN 7.5-325 MG PO TABS: 1.0000 | ORAL_TABLET | Freq: Two times a day (BID) | ORAL | 0 refills | Status: DC

## 2016-10-03 NOTE — Patient Instructions (Signed)
Naloxegol oral tablets What is this medicine? NALOXEGOL (nal OX e GAHL) is used to treat constipation caused by opioids (pain medicine). Tell your health care professional if you stop taking your opioid pain medicine. This medicine may be used for other purposes; ask your health care provider or pharmacist if you have questions. COMMON BRAND NAME(S): MOVANTIK What should I tell my health care provider before I take this medicine? They need to know if you have any of these conditions: -cancer or tumor in abdomen, intestine, or stomach -diverticulitis -history of bowel blockage -history of stomach ulcer -inflammatory bowel disease -kidney disease -liver disease -Ogilvie's syndrome -stomach or intestine problems -an unusual or allergic reaction to naloxegol, other medicines, foods, dyes, or preservatives -pregnant or trying to get pregnant -breast-feeding How should I use this medicine? Take this medicine by mouth with a glass of water. Follow the directions on the prescription label. Swallow medicine whole. Do not cut or chew this medicine. The medicine may also be crushed and mixed with 4 ounces of water. Drink immediately after mixing. Take your medicine one time each day, on an empty stomach, at least 1 hour before your first meal of the day or 2 hours after the meal. If you are unable to tolerate your dose, inform your healthcare provider so that your dose can be adjusted. Do not take additional laxatives except on your healthcare provider's advice. Your healthcare provider may prescribe other laxatives if your medicine does not work well enough after 3 days of treatment. Tell your healthcare provider if you stop taking your pain medicine. If you stop taking your pain medicine, you should also stop taking this medicine. Do not take your medicine more often than directed. Do not stop taking except on your doctor's advice. A special MedGuide will be given to you before each treatment. Be sure to  read this information carefully each time. Talk to your pediatrician regarding the use of this medicine in children. Special care may be needed. Overdosage: If you think you have taken too much of this medicine contact a poison control center or emergency room at once. NOTE: This medicine is only for you. Do not share this medicine with others. What if I miss a dose? If you miss a dose, take it as soon as you can. If it is almost time for your next dose, take only that dose. Do not take double or extra doses. What may interact with this medicine? -carbamazepine -certain medicines for fungal infections like ketoconazole and itraconazole -clarithromycin -diltiazem -erythromycin -grapefruit juice -methylnaltrexone -naloxone -naltrexone -rifampin -St. John"s Wort -verapamil This list may not describe all possible interactions. Give your health care provider a list of all the medicines, herbs, non-prescription drugs, or dietary supplements you use. Also tell them if you smoke, drink alcohol, or use illegal drugs. Some items may interact with your medicine. What should I watch for while using this medicine? Visit your doctor for regular check ups. Tell your doctor if your symptoms do not get better or if they get worse. If you develop unusually persistent or worsening abdominal pain, stop taking your medicine and seek medical attention. You may have symptoms of opioid withdrawal during treatment with this medicine. Symptoms include sweating, chills, diarrhea, stomach pain, anxiety, irritability, and yawning. Tell your health care provider if you have any of these symptoms. Also, if you take methadone to treat your pain, you may be more likely to have stomach pain and diarrhea compared to people who do not take  methadone. If you take too much of this medicine, call your healthcare provider or go to the nearest emergency room right away. What side effects may I notice from receiving this  medicine? Side effects that you should report to your doctor or health care professional as soon as possible: -allergic reactions like skin rash, itching or hives, swelling of the face, lips, or tongue -new or worsening stomach pain -severe or prolonged diarrhea -signs and symptoms of opioid withdrawal such as sweating, chills, diarrhea, stomach pain, anxiety, irritability, and yawning Side effects that usually do not require medical attention (report to your doctor or health care professional if they continue or are bothersome): -diarrhea -headache -nausea -stomach gas -stomach pain -vomiting This list may not describe all possible side effects. Call your doctor for medical advice about side effects. You may report side effects to FDA at 1-800-FDA-1088. Where should I keep my medicine? Keep out of the reach of children. This medicine can be abused. Keep your medicine in a safe place to protect it from theft. Do not share this medicine with anyone. Selling or giving away this medicine is dangerous and against the law. Follow the directions in the [place]. Store at room temperature between 20 and 25 degrees C (68 and 77 degrees F). Throw away any unused medicine after the expiration date. NOTE: This sheet is a summary. It may not cover all possible information. If you have questions about this medicine, talk to your doctor, pharmacist, or health care provider.  2018 Elsevier/Gold Standard (2015-03-30 12:10:23) Constipation, Adult Constipation is when a person has fewer bowel movements in a week than normal, has difficulty having a bowel movement, or has stools that are dry, hard, or larger than normal. Constipation may be caused by an underlying condition. It may become worse with age if a person takes certain medicines and does not take in enough fluids. Follow these instructions at home: Eating and drinking   Eat foods that have a lot of fiber, such as fresh fruits and vegetables, whole  grains, and beans.  Limit foods that are high in fat, low in fiber, or overly processed, such as french fries, hamburgers, cookies, candies, and soda.  Drink enough fluid to keep your urine clear or pale yellow. General instructions  Exercise regularly or as told by your health care provider.  Go to the restroom when you have the urge to go. Do not hold it in.  Take over-the-counter and prescription medicines only as told by your health care provider. These include any fiber supplements.  Practice pelvic floor retraining exercises, such as deep breathing while relaxing the lower abdomen and pelvic floor relaxation during bowel movements.  Watch your condition for any changes.  Keep all follow-up visits as told by your health care provider. This is important. Contact a health care provider if:  You have pain that gets worse.  You have a fever.  You do not have a bowel movement after 4 days.  You vomit.  You are not hungry.  You lose weight.  You are bleeding from the anus.  You have thin, pencil-like stools. Get help right away if:  You have a fever and your symptoms suddenly get worse.  You leak stool or have blood in your stool.  Your abdomen is bloated.  You have severe pain in your abdomen.  You feel dizzy or you faint. This information is not intended to replace advice given to you by your health care provider. Make sure you discuss  any questions you have with your health care provider. Document Released: 11/24/2003 Document Revised: 09/15/2015 Document Reviewed: 08/16/2015 Elsevier Interactive Patient Education  2017 [employer].

## 2016-10-03 NOTE — Progress Notes (Signed)
Patient: Kathleen Buchanan Female    DOB: 1965-05-01   51 y.o.   MRN: [id number] Visit Date: 10/03/2016  Today's Provider: Mar Daring, PA-C   Chief Complaint  Patient presents with  . Constipation   Subjective:    HPI Patient comes in today c/o chronic constipation. She reports over the past 2 weeks. She has had very infrequent bowel movements going every 3 days at baseline, but has not had a BM for 2 weeks now. Prior to the worsening constipation she stopped her levothyroxine because it was given her a headache. She states that within a 3 day time span, she has taken 5 suppositories and 2 enemas and only had a very small amount come out, described as muddy water. She does take colace on a daily basis. She is also on chronic pain medication. She has occasional stomach cramps. Denies any fevers.   She is also having worsening depression. She reports she has had suicidal ideations in the past. She has no plan and states she would never act upon them, "they are just fleeting thoughts." She has been on paxil 10mg  recently but has not been taking consistently. She has used Prozac in the past but had "terrible side effects."   She is also complaning of right ear pain. She does have chronic sinusitis and feels this may be flared. She also reports she feels like she is wheezing. She has an albuterol inhaler prescribed but has not been using and does not have with her today.    Allergies  Allergen Reactions  . Codeine Hives  . Erythromycin   . Hydrocodone-Acetaminophen Hives  . Lansoprazole   . Levofloxacin   . Migraine Formula  [Aspirin-Acetaminophen-Caffeine]   . Morphine Sulfate   . Omeprazole   . Pepcid  [Famotidine]   . Sulfa Antibiotics      Current Outpatient Prescriptions:  .  albuterol (PROVENTIL HFA;VENTOLIN HFA) 108 (90 Base) MCG/ACT inhaler, Inhale 2 puffs into the lungs every 4 (four) hours as needed for wheezing or shortness of breath., Disp: 1 Inhaler, Rfl:  5 .  ALPRAZolam (XANAX) 1 MG tablet, Take 1 tablet (1 mg total) by mouth 2 (two) times daily., Disp: 60 tablet, Rfl: 5 .  amphetamine-dextroamphetamine (ADDERALL) 30 MG tablet, Take 1 tablet by mouth 2 (two) times daily. Please do NOT fill until 10/10/16, Disp: 60 tablet, Rfl: 0 .  ascorbic acid (VITAMIN C) 500 MG tablet, Take 1,000 mg by mouth daily. , Disp: , Rfl:  .  cyclobenzaprine (FLEXERIL) 5 MG tablet, Take 1 tablet (5 mg total) by mouth 3 (three) times daily as needed for muscle spasms., Disp: 30 tablet, Rfl: 1 .  doxycycline (VIBRA-TABS) 100 MG tablet, Take 1 tablet (100 mg total) by mouth 2 (two) times daily., Disp: 20 tablet, Rfl: 0 .  hydrochlorothiazide (HYDRODIURIL) 12.5 MG tablet, TAKE 1 TABLET BY MOUTH DAILY. (Patient taking differently: TAKE 1 TABLET BY MOUTH PRN FOR EDEMA), Disp: 90 tablet, Rfl: 1 .  ibuprofen (ADVIL,MOTRIN) 200 MG tablet, Take 4 tablets by mouth as needed., Disp: , Rfl:  .  levothyroxine (SYNTHROID, LEVOTHROID) 75 MCG tablet, Take 1 tablet (75 mcg total) by mouth daily., Disp: 90 tablet, Rfl: 3 .  nicotine (NICODERM CQ - DOSED IN MG/24 HOURS) 21 mg/24hr patch, Place 1 patch (21 mg total) onto the skin daily., Disp: 28 patch, Rfl: 0 .  oxyCODONE-acetaminophen (ENDOCET) 7.5-325 MG tablet, Take 1 tablet by mouth 2 (two) times daily., Disp:  60 tablet, Rfl: 0 .  pantoprazole (PROTONIX) 40 MG tablet, Take 1 tablet (40 mg total) by mouth daily., Disp: 30 tablet, Rfl: 5 .  PARoxetine (PAXIL) 10 MG tablet, TAKE 1/2 BY MOUTH DAILY FOR 2 WEEKS THEN INCREASE TO 1 TAB DAILY IF NEEDED, Disp: 30 tablet, Rfl: 11  Review of Systems  Constitutional: Positive for appetite change and fatigue.  Respiratory: Negative.   Cardiovascular: Negative.   Gastrointestinal: Positive for abdominal pain, constipation and nausea. Negative for abdominal distention, anal bleeding, blood in stool, rectal pain and vomiting.  Genitourinary: Negative.   Musculoskeletal: Positive for back pain.   Neurological: Negative for light-headedness and headaches.  Psychiatric/Behavioral: Positive for agitation, dysphoric mood and suicidal ideas. Negative for self-injury and sleep disturbance. The patient is nervous/anxious.     Social History  Substance Use Topics  . Smoking status: Current Every Day Smoker    Packs/day: 0.25    Types: Cigarettes  . Smokeless tobacco: Never Used     Comment: less than a 1/2 a pack a day  . Alcohol use No   Objective:   BP 124/78 (BP Location: Right Arm, Patient Position: Sitting, Cuff Size: Normal)   Temp 98.4 F (36.9 C)   Resp 16   Wt 204 lb (92.5 kg)   BMI 37.31 kg/m  Vitals:   10/03/16 1600  BP: 124/78  Resp: 16  Temp: 98.4 F (36.9 C)  Weight: 204 lb (92.5 kg)     Physical Exam  Constitutional: She appears well-developed and well-nourished. No distress.  HENT:  Head: Normocephalic and atraumatic.  Right Ear: Hearing, tympanic membrane, external ear and ear canal normal.  Left Ear: Hearing, tympanic membrane, external ear and ear canal normal.  Nose: Nose normal.  Mouth/Throat: Uvula is midline, oropharynx is clear and moist and mucous membranes are normal. No oropharyngeal exudate.  Eyes: Pupils are equal, round, and reactive to light. Conjunctivae are normal. Right eye exhibits no discharge. Left eye exhibits no discharge. No scleral icterus.  Neck: Normal range of motion. Neck supple. No tracheal deviation present. No thyromegaly present.  Cardiovascular: Normal rate, regular rhythm and normal heart sounds.  Exam reveals no gallop and no friction rub.   No murmur heard. Pulmonary/Chest: Effort normal and breath sounds normal. No stridor. No respiratory distress. She has no wheezes. She has no rales.  Abdominal: Soft. Normal appearance and bowel sounds are normal. She exhibits no distension and no mass. There is generalized tenderness. There is no rebound and no guarding.  Lymphadenopathy:    She has no cervical adenopathy.   Skin: Skin is warm and dry. She is not diaphoretic.  Vitals reviewed.      Assessment & Plan:     1. Drug-induced constipation Will add Movantik as below for opiod based constipation. I think patient has multifactorial reasons for worsening constipation: slow transit, coupled with narcotic induced, then she stopped her thyroid medication. She was given 15 day samples of Movantik. She is to call the office if she is unable to have a BM with this medication. If successful, I want her to continue this medication daily. She will call and we will send in refills.  - naloxegol oxalate (MOVANTIK) 25 MG TABS tablet; Take 1 tablet (25 mg total) by mouth daily.  Dispense: 15 tablet; Refill: 0  2. Depression, major, single episode, severe ([place]) Worsening and patient not compliant with paxil, she states due to weight gain. Will give wellbutrin as below. I will see her back in  4 weeks.  - buPROPion (WELLBUTRIN SR) 150 MG 12 hr tablet; Take 1 tablet (150 mg total) by mouth 2 (two) times daily.  Dispense: 60 tablet; Refill: 1  3. Big thyroid Patient stopped levothyroxine due to headaches without consulting. Advised against this in the future. Will start amrour thyroid as below. Will recheck TSH in 4 weeks.  - thyroid (ARMOUR) 30 MG tablet; Take 1 tablet (30 mg total) by mouth daily before breakfast.  Dispense: 30 tablet; Refill: 1  4. Arthralgia of right temporomandibular joint Upper respiratory exam was normal. No wheezing. Suspect right sided TMJ instead of exacerbation of sinusitis, especially since she has had more anxiety,depression and stress. Continue anti-inflammatories prn, heating pad prn, call dentist if symptoms worsen.   5. Plantar fasciitis of left foot Worsening symptoms. Discussed conservative management, which she has already been doing some. Discussed proper shoes and not going barefoot so often. Referral placed back to Dr. Vickki Muff for further consideration of treatment.  - Ambulatory  referral to Podiatry  6. Tendinitis of wrist Stable. Diagnosis pulled for medication refill. Continue current medical treatment plan. - oxyCODONE-acetaminophen (ENDOCET) 7.5-325 MG tablet; Take 1 tablet by mouth 2 (two) times daily.  Dispense: 60 tablet; Refill: 0       Mar Daring, PA-C  [facility]

## 2016-10-08 ENCOUNTER — Encounter: Payer: Medicare Other | Admitting: Physician Assistant

## 2016-10-08 ENCOUNTER — Ambulatory Visit: Payer: Medicare Other

## 2016-10-31 ENCOUNTER — Ambulatory Visit: Payer: Self-pay | Admitting: Physician Assistant

## 2016-11-08 ENCOUNTER — Ambulatory Visit (INDEPENDENT_AMBULATORY_CARE_PROVIDER_SITE_OTHER): Payer: Medicare Other

## 2016-11-08 ENCOUNTER — Ambulatory Visit (INDEPENDENT_AMBULATORY_CARE_PROVIDER_SITE_OTHER): Payer: Medicare Other | Admitting: Physician Assistant

## 2016-11-08 VITALS — BP 118/76 | HR 76 | Temp 98.5°F | Ht 62.0 in | Wt 208.8 lb

## 2016-11-08 DIAGNOSIS — J418 Mixed simple and mucopurulent chronic bronchitis: Secondary | ICD-10-CM | POA: Diagnosis not present

## 2016-11-08 DIAGNOSIS — E039 Hypothyroidism, unspecified: Secondary | ICD-10-CM

## 2016-11-08 DIAGNOSIS — M778 Other enthesopathies, not elsewhere classified: Secondary | ICD-10-CM | POA: Diagnosis not present

## 2016-11-08 DIAGNOSIS — Z1211 Encounter for screening for malignant neoplasm of colon: Secondary | ICD-10-CM | POA: Diagnosis not present

## 2016-11-08 DIAGNOSIS — Z0000 Encounter for general adult medical examination without abnormal findings: Secondary | ICD-10-CM

## 2016-11-08 DIAGNOSIS — F322 Major depressive disorder, single episode, severe without psychotic features: Secondary | ICD-10-CM

## 2016-11-08 DIAGNOSIS — T402X5A Adverse effect of other opioids, initial encounter: Secondary | ICD-10-CM | POA: Diagnosis not present

## 2016-11-08 DIAGNOSIS — K5903 Drug induced constipation: Secondary | ICD-10-CM

## 2016-11-08 DIAGNOSIS — F17200 Nicotine dependence, unspecified, uncomplicated: Secondary | ICD-10-CM | POA: Diagnosis not present

## 2016-11-08 DIAGNOSIS — F900 Attention-deficit hyperactivity disorder, predominantly inattentive type: Secondary | ICD-10-CM | POA: Diagnosis not present

## 2016-11-08 MED ORDER — AMPHETAMINE-DEXTROAMPHETAMINE 30 MG PO TABS: 1.0000 | ORAL_TABLET | Freq: Two times a day (BID) | ORAL | 0 refills | Status: DC

## 2016-11-08 MED ORDER — POLYETHYLENE GLYCOL 3350 17 GM/SCOOP PO POWD: 17.0000 g | Freq: Two times a day (BID) | ORAL | 1 refills | Status: DC | PRN

## 2016-11-08 MED ORDER — SYNTHROID 25 MCG PO TABS: 25.0000 ug | ORAL_TABLET | Freq: Every day | ORAL | 1 refills | Status: DC

## 2016-11-08 MED ORDER — OXYCODONE-ACETAMINOPHEN 7.5-325 MG PO TABS: 1.0000 | ORAL_TABLET | Freq: Two times a day (BID) | ORAL | 0 refills | Status: DC

## 2016-11-08 MED ORDER — BUPROPION HCL ER (SR) 150 MG PO TB12: 150.0000 mg | ORAL_TABLET | Freq: Two times a day (BID) | ORAL | 1 refills | Status: DC

## 2016-11-08 NOTE — Progress Notes (Signed)
Subjective:   Kathleen Buchanan is a 51 y.o. female who presents for an Initial Medicare Annual Wellness Visit.  Review of Systems    N/A  Cardiac Risk Factors include: advanced age (>31men, >58 women);smoking/ tobacco exposure;obesity (BMI >30kg/m2);dyslipidemia     Objective:    Today's Vitals   11/08/16 1330  BP: 118/76  Pulse: 76  Temp: 98.5 F (36.9 C)  TempSrc: Oral  Weight: 208 lb 12.8 oz (94.7 kg)  Height: 5\' 2"  (1.575 m)  PainSc: 0-No pain   Body mass index is 38.19 kg/m.   Current Medications (verified) Outpatient Encounter Prescriptions as of 11/08/2016  Medication Sig  . albuterol (PROVENTIL HFA;VENTOLIN HFA) 108 (90 Base) MCG/ACT inhaler Inhale 2 puffs into the lungs every 4 (four) hours as needed for wheezing or shortness of breath.  . ALPRAZolam (XANAX) 1 MG tablet Take 1 tablet (1 mg total) by mouth 2 (two) times daily.  Marland Kitchen amphetamine-dextroamphetamine (ADDERALL) 30 MG tablet Take 1 tablet by mouth 2 (two) times daily. Please do NOT fill until 10/10/16  . ascorbic acid (VITAMIN C) 500 MG tablet Take 1,000 mg by mouth daily.   Marland Kitchen buPROPion (WELLBUTRIN SR) 150 MG 12 hr tablet Take 1 tablet (150 mg total) by mouth 2 (two) times daily.  . hydrochlorothiazide (HYDRODIURIL) 12.5 MG tablet TAKE 1 TABLET BY MOUTH DAILY. (Patient taking differently: TAKE 1 TABLET BY MOUTH PRN FOR EDEMA)  . ibuprofen (ADVIL,MOTRIN) 200 MG tablet Take 4 tablets by mouth as needed.  Marland Kitchen oxyCODONE-acetaminophen (ENDOCET) 7.5-325 MG tablet Take 1 tablet by mouth 2 (two) times daily.  . pantoprazole (PROTONIX) 40 MG tablet Take 1 tablet (40 mg total) by mouth daily. (Patient taking differently: Take 40 mg by mouth daily. )  . [DISCONTINUED] naloxegol oxalate (MOVANTIK) 25 MG TABS tablet Take 1 tablet (25 mg total) by mouth daily.  . cyclobenzaprine (FLEXERIL) 5 MG tablet Take 1 tablet (5 mg total) by mouth 3 (three) times daily as needed for muscle spasms. (Patient not taking: Reported on  11/08/2016)  . nicotine (NICODERM CQ - DOSED IN MG/24 HOURS) 21 mg/24hr patch Place 1 patch (21 mg total) onto the skin daily. (Patient not taking: Reported on 10/03/2016)  . [DISCONTINUED] thyroid (ARMOUR) 30 MG tablet Take 1 tablet (30 mg total) by mouth daily before breakfast.   No facility-administered encounter medications on file as of 11/08/2016.     Allergies (verified) Codeine; Erythromycin; Hydrocodone-acetaminophen; Lansoprazole; Levofloxacin; Migraine formula  [aspirin-acetaminophen-caffeine]; Morphine sulfate; Omeprazole; Pepcid  [famotidine]; and Sulfa antibiotics   History: Past Medical History:  Diagnosis Date  . Asthma   . COPD (chronic obstructive pulmonary disease) ([place])    Past Surgical History:  Procedure Laterality Date  . ABLATION  2011  . BREAST REDUCTION SURGERY Bilateral 08/1991  . CESAREAN SECTION     X 2  . REDUCTION MAMMAPLASTY Bilateral 1990?  . TUBAL LIGATION  1999   Family History  Problem Relation Age of Onset  . Lung cancer Father   . Ulcers Father   . Cirrhosis Father   . Colon polyps Maternal Aunt   . Ulcers Mother   . Colon polyps Mother   . Aneurysm Maternal Grandmother   . Throat cancer Maternal Grandfather    Social History   Occupational History  . Not on file.   Social History Main Topics  . Smoking status: Current Every Day Smoker    Packs/day: 0.25    Types: Cigarettes  . Smokeless tobacco: Never Used  Comment: 2 cigarettes a day  . Alcohol use No  . Drug use: No  . Sexual activity: Not on file    Tobacco Counseling Ready to quit: Yes Counseling given: No   Activities of Daily Living In your present state of health, do you have any difficulty performing the following activities: 11/08/2016  Hearing? Y  Vision? Y  Comment referral sent for an eye apt  Difficulty concentrating or making decisions? N  Walking or climbing stairs? N  Dressing or bathing? N  Doing errands, shopping? N  Preparing Food and eating ? N    Using the Toilet? N  In the past six months, have you accidently leaked urine? Y  Comment occasionally- wears protection  Do you have problems with loss of bowel control? N  Managing your Medications? N  Managing your Finances? N  Housekeeping or managing your Housekeeping? N  Some recent data might be hidden    Immunizations and Health Maintenance  There is no immunization history on file for this patient. There are no preventive care reminders to display for this patient.  Patient Care Team: Mar Daring, PA-C as PCP - General (Family Medicine) Christene Lye, MD (General Surgery)  Indicate any recent Medical Services you may have received from other than [facility] providers in the past year (date may be approximate).     Assessment:   This is a routine wellness examination for Kathleen Buchanan.   Hearing/Vision screen Vision Screening Comments: Pt does not have regular vision checks. Referral sent to careguide to assist with finances and setting up eye exam.   Dietary issues and exercise activities discussed: Current Exercise Habits: Home exercise routine, Type of exercise: treadmill, Time (Minutes): 45 (twice daily), Frequency (Times/Week): 7, Weekly Exercise (Minutes/Week): 315, Intensity: Mild, Exercise limited by: None identified  Goals    . Quit smoking / using tobacco          Recommend to quit smoking. Pt to cut back from 2 cigarettes a day to none.       Depression Screen PHQ 2/9 Scores 11/08/2016 05/10/2016 10/06/2015  PHQ - 2 Score 0 0 0  PHQ- 9 Score - 5 -    Fall Risk Fall Risk  11/08/2016 05/10/2016 10/06/2015  Falls in the past year? No No No    Cognitive Function: Pt declined screening today.        Screening Tests Health Maintenance  Topic Date Due  . COLONOSCOPY  10/09/2017 (Originally 05/03/2015)  . INFLUENZA VACCINE  11/09/2017 (Originally 10/09/2016)  . TETANUS/TDAP  03/11/2026 (Originally 05/02/1984)  . HIV Screening  03/11/2026 (Originally  05/02/1980)  . MAMMOGRAM  04/26/2018  . PAP SMEAR  Excluded      Plan:  I have personally reviewed and addressed the Medicare Annual Wellness questionnaire and have noted the following in the patient's chart:  A. Medical and social history B. Use of alcohol, tobacco or illicit drugs  C. Current medications and supplements D. Functional ability and status E.  Nutritional status F.  Physical activity G. Advance directives H. List of other physicians I.  Hospitalizations, surgeries, and ER visits in previous 12 months J.  [place] such as hearing and vision if needed, cognitive and depression L. Referrals and appointments - none  In addition, I have reviewed and discussed with patient certain preventive protocols, quality metrics, and best practice recommendations. A written personalized care plan for preventive services as well as general preventive health recommendations were provided to patient.  See  attached scanned questionnaire for additional information.   Signed,  Fabio Neighbors, LPN Nurse Health Advisor   MD Recommendations: Pt declined tetanus vaccine, flu vaccine, HIV screening and colonoscopy referral today.

## 2016-11-08 NOTE — Progress Notes (Signed)
Patient: Kathleen Buchanan Female    DOB: 1965/12/09   51 y.o.   MRN: [id number] Visit Date: 11/08/2016  Today's Provider: Mar Daring, PA-C   Chief Complaint  Patient presents with  . Follow-up    Depression   Subjective:    HPI Patient is here today to follow-up on Depression.She was last seen 10/03/16 with worsening symptoms. She was advised to follow-up in 4 weeks. Patient not compliant with Paxil. Wellbutrin 150mg  12 hr tablet given. Patient reports she is stable. Feeling a lot better.  Depression screen [facility] 2/9 11/08/2016 05/10/2016 10/06/2015  Decreased Interest 0 0 0  Down, Depressed, Hopeless 0 0 0  PHQ - 2 Score 0 0 0  Altered sleeping - 3 -  Tired, decreased energy - 2 -  Change in appetite - 0 -  Feeling bad or failure about yourself  - 0 -  Trouble concentrating - 0 -  Moving slowly or fidgety/restless - 0 -  Suicidal thoughts - 0 -  PHQ-9 Score - 5 -  Difficult doing work/chores - Not difficult at all -    Patient is also following up on hypothyroid. She was advised to start Armour Thyroid 10/03/16 and recheck labs in 4 weeks. Patient reports that she stopped taking the medication because her hair was falling out with worsening headache. She is wishing to start branded Syntroid.  Lab Results  Component Value Date   TSH 5.320 (H) 01/12/2016   T4TOTAL 5.9 01/12/2016      Allergies  Allergen Reactions  . Codeine Hives  . Erythromycin   . Hydrocodone-Acetaminophen Hives  . Lansoprazole   . Levofloxacin   . Migraine Formula  [Aspirin-Acetaminophen-Caffeine]   . Morphine Sulfate   . Omeprazole   . Pepcid  [Famotidine]   . Sulfa Antibiotics      Current Outpatient Prescriptions:  .  albuterol (PROVENTIL HFA;VENTOLIN HFA) 108 (90 Base) MCG/ACT inhaler, Inhale 2 puffs into the lungs every 4 (four) hours as needed for wheezing or shortness of breath., Disp: 1 Inhaler, Rfl: 5 .  ALPRAZolam (XANAX) 1 MG tablet, Take 1 tablet (1 mg total) by mouth  2 (two) times daily., Disp: 60 tablet, Rfl: 5 .  amphetamine-dextroamphetamine (ADDERALL) 30 MG tablet, Take 1 tablet by mouth 2 (two) times daily. Please do NOT fill until 10/10/16, Disp: 60 tablet, Rfl: 0 .  ascorbic acid (VITAMIN C) 500 MG tablet, Take 1,000 mg by mouth daily. , Disp: , Rfl:  .  buPROPion (WELLBUTRIN SR) 150 MG 12 hr tablet, Take 1 tablet (150 mg total) by mouth 2 (two) times daily., Disp: 60 tablet, Rfl: 1 .  cyclobenzaprine (FLEXERIL) 5 MG tablet, Take 1 tablet (5 mg total) by mouth 3 (three) times daily as needed for muscle spasms. (Patient not taking: Reported on 11/08/2016), Disp: 30 tablet, Rfl: 1 .  hydrochlorothiazide (HYDRODIURIL) 12.5 MG tablet, TAKE 1 TABLET BY MOUTH DAILY. (Patient taking differently: TAKE 1 TABLET BY MOUTH PRN FOR EDEMA), Disp: 90 tablet, Rfl: 1 .  ibuprofen (ADVIL,MOTRIN) 200 MG tablet, Take 4 tablets by mouth as needed., Disp: , Rfl:  .  nicotine (NICODERM CQ - DOSED IN MG/24 HOURS) 21 mg/24hr patch, Place 1 patch (21 mg total) onto the skin daily. (Patient not taking: Reported on 10/03/2016), Disp: 28 patch, Rfl: 0 .  oxyCODONE-acetaminophen (ENDOCET) 7.5-325 MG tablet, Take 1 tablet by mouth 2 (two) times daily., Disp: 60 tablet, Rfl: 0 .  pantoprazole (PROTONIX) 40 MG  tablet, Take 1 tablet (40 mg total) by mouth daily. (Patient taking differently: Take 40 mg by mouth daily. ), Disp: 30 tablet, Rfl: 5  Review of Systems  Constitutional: Negative.   HENT: Positive for ear pain and sinus pressure.   Eyes: Positive for photophobia.  Respiratory: Negative.   Cardiovascular: Negative.   Gastrointestinal: Negative.   Endocrine: Negative.   Genitourinary: Negative.   Musculoskeletal: Positive for arthralgias.  Skin: Negative.   Allergic/Immunologic: Negative.   Neurological: Positive for headaches.  Hematological: Negative.   Psychiatric/Behavioral: Negative.     Social History  Substance Use Topics  . Smoking status: Current Every Day Smoker      Packs/day: 0.25    Types: Cigarettes  . Smokeless tobacco: Never Used     Comment: 2 cigarettes a day  . Alcohol use No   Objective:  Vitals: BP: 118/76  Pulse: 76  Temp: 98.5 F (36.9 C)  TempSrc: Oral  Weight: 208 lb 12.8 oz (94.7 kg)  Height: 5\' 2"  (1.575 m)  PainSc: 0-No pain   Body mass index is 38.19 kg/m.   Physical Exam  Constitutional: She is oriented to person, place, and time. She appears well-developed and well-nourished. No distress.  HENT:  Head: Normocephalic and atraumatic.  Right Ear: Tympanic membrane, external ear and ear canal normal. Decreased hearing is noted.  Left Ear: Tympanic membrane, external ear and ear canal normal. Decreased hearing is noted.  Nose: Nose normal.  Mouth/Throat: Uvula is midline, oropharynx is clear and moist and mucous membranes are normal. No oropharyngeal exudate.  Eyes: Pupils are equal, round, and reactive to light. Conjunctivae and EOM are normal. Right eye exhibits no discharge. Left eye exhibits no discharge. No scleral icterus.  Neck: Normal range of motion. Neck supple. No JVD present. Carotid bruit is not present. No tracheal deviation present. No thyromegaly present.  Cardiovascular: Normal rate, regular rhythm, normal heart sounds and intact distal pulses.  Exam reveals no gallop and no friction rub.   No murmur heard. Pulmonary/Chest: Effort normal and breath sounds normal. No respiratory distress. She has no wheezes. She has no rales. She exhibits no tenderness.  Abdominal: Soft. Bowel sounds are normal. She exhibits no distension and no mass. There is no tenderness. There is no rebound and no guarding.  Genitourinary:  Genitourinary Comments: Patient declined  Musculoskeletal: Normal range of motion. She exhibits no edema or tenderness.  Lymphadenopathy:    She has no cervical adenopathy.  Neurological: She is alert and oriented to person, place, and time.  Skin: Skin is warm and dry. No rash noted. She is not  diaphoretic.  Psychiatric: She has a normal mood and affect. Her behavior is normal. Judgment and thought content normal.  Vitals reviewed.      Assessment & Plan:     1. Attention deficit hyperactivity disorder (ADHD), predominantly inattentive type Stable. Diagnosis pulled for medication refill. Continue current medical treatment plan. - amphetamine-dextroamphetamine (ADDERALL) 30 MG tablet; Take 1 tablet by mouth 2 (two) times daily. Please do NOT fill until 01/06/17  Dispense: 60 tablet; Refill: 0  2. Hypothyroidism, unspecified type Patient had headaches with levothyroxine, headaches and hair falling out with armour. She is wishing to start Synthroid to see if she tolerates that better.  - SYNTHROID 25 MCG tablet; Take 1 tablet (25 mcg total) by mouth daily before breakfast. Brand only  Dispense: 90 tablet; Refill: 1  3. Therapeutic opioid induced constipation Advised patient to start Miralax for constipation with a stool  softener. She reports she could not tolerate Movantik due to it causing upset stomach. - polyethylene glycol powder (GLYCOLAX/MIRALAX) powder; Take 17 g by mouth 2 (two) times daily as needed.  Dispense: 3350 g; Refill: 1  4. Tendinitis of wrist Stable. Diagnosis pulled for medication refill. Continue current medical treatment plan. - oxyCODONE-acetaminophen (ENDOCET) 7.5-325 MG tablet; Take 1 tablet by mouth 2 (two) times daily.  Dispense: 60 tablet; Refill: 0  5. Tobacco dependence Patient had been given Rx for nicotene patches which she never used. She is not ready to quit smoking at this time.   6. Mixed simple and mucopurulent chronic bronchitis (HCC) Stable. Continue albuterol prn.  7. Depression, major, single episode, severe ([facility]) Stable. Diagnosis pulled for medication refill. Continue current medical treatment plan. - buPROPion (WELLBUTRIN SR) 150 MG 12 hr tablet; Take 1 tablet (150 mg total) by mouth 2 (two) times daily.  Dispense: 180 tablet; Refill:  1  8. Colon cancer screening Patient declines at this time.       Mar Daring, PA-C  [facility]

## 2016-11-08 NOTE — Patient Instructions (Signed)
Health Maintenance for Postmenopausal Women Menopause is a normal process in which your reproductive ability comes to an end. This process happens gradually over a span of months to years, usually between the ages of 22 and 9. Menopause is complete when you have missed 12 consecutive menstrual periods. It is important to talk with your health care provider about some of the most common conditions that affect postmenopausal women, such as heart disease, cancer, and bone loss (osteoporosis). Adopting a healthy lifestyle and getting preventive care can help to promote your health and wellness. Those actions can also lower your chances of developing some of these common conditions. What should I know about menopause? During menopause, you may experience a number of symptoms, such as:  Moderate-to-severe hot flashes.  Night sweats.  Decrease in sex drive.  Mood swings.  Headaches.  Tiredness.  Irritability.  Memory problems.  Insomnia.  Choosing to treat or not to treat menopausal changes is an individual decision that you make with your health care provider. What should I know about hormone replacement therapy and supplements? Hormone therapy products are effective for treating symptoms that are associated with menopause, such as hot flashes and night sweats. Hormone replacement carries certain risks, especially as you become older. If you are thinking about using estrogen or estrogen with progestin treatments, discuss the benefits and risks with your health care provider. What should I know about heart disease and stroke? Heart disease, heart attack, and stroke become more likely as you age. This may be due, in part, to the hormonal changes that your body experiences during menopause. These can affect how your body processes dietary fats, triglycerides, and cholesterol. Heart attack and stroke are both medical emergencies. There are many things that you can do to help prevent heart disease  and stroke:  Have your blood pressure checked at least every 1-2 years. High blood pressure causes heart disease and increases the risk of stroke.  If you are 53-22 years old, ask your health care provider if you should take aspirin to prevent a heart attack or a stroke.  Do not use any tobacco products, including cigarettes, chewing tobacco, or electronic cigarettes. If you need help quitting, ask your health care provider.  It is important to eat a healthy diet and maintain a healthy weight. ? Be sure to include plenty of vegetables, fruits, low-fat dairy products, and lean protein. ? Avoid eating foods that are high in solid fats, added sugars, or salt (sodium).  Get regular exercise. This is one of the most important things that you can do for your health. ? Try to exercise for at least 150 minutes each week. The type of exercise that you do should increase your heart rate and make you sweat. This is known as moderate-intensity exercise. ? Try to do strengthening exercises at least twice each week. Do these in addition to the moderate-intensity exercise.  Know your numbers.Ask your health care provider to check your cholesterol and your blood glucose. Continue to have your blood tested as directed by your health care provider.  What should I know about cancer screening? There are several types of cancer. Take the following steps to reduce your risk and to catch any cancer development as early as possible. Breast Cancer  Practice breast self-awareness. ? This means understanding how your breasts normally appear and feel. ? It also means doing regular breast self-exams. Let your health care provider know about any changes, no matter how small.  If you are 40  or older, have a clinician do a breast exam (clinical breast exam or CBE) every year. Depending on your age, family history, and medical history, it may be recommended that you also have a yearly breast X-ray (mammogram).  If you  have a family history of breast cancer, talk with your health care provider about genetic screening.  If you are at high risk for breast cancer, talk with your health care provider about having an MRI and a mammogram every year.  Breast cancer (BRCA) gene test is recommended for women who have family members with BRCA-related cancers. Results of the assessment will determine the need for genetic counseling and BRCA1 and for BRCA2 testing. BRCA-related cancers include these types: ? Breast. This occurs in males or females. ? Ovarian. ? Tubal. This may also be called fallopian tube cancer. ? Cancer of the abdominal or pelvic lining (peritoneal cancer). ? Prostate. ? Pancreatic.  Cervical, Uterine, and Ovarian Cancer Your health care provider may recommend that you be screened regularly for cancer of the pelvic organs. These include your ovaries, uterus, and vagina. This screening involves a pelvic exam, which includes checking for microscopic changes to the surface of your cervix (Pap test).  For women ages 21-65, health care providers may recommend a pelvic exam and a Pap test every three years. For women ages 79-65, they may recommend the Pap test and pelvic exam, combined with testing for human papilloma virus (HPV), every five years. Some types of HPV increase your risk of cervical cancer. Testing for HPV may also be done on women of any age who have unclear Pap test results.  Other health care providers may not recommend any screening for nonpregnant women who are considered low risk for pelvic cancer and have no symptoms. Ask your health care provider if a screening pelvic exam is right for you.  If you have had past treatment for cervical cancer or a condition that could lead to cancer, you need Pap tests and screening for cancer for at least 20 years after your treatment. If Pap tests have been discontinued for you, your risk factors (such as having a new sexual partner) need to be  reassessed to determine if you should start having screenings again. Some women have medical problems that increase the chance of getting cervical cancer. In these cases, your health care provider may recommend that you have screening and Pap tests more often.  If you have a family history of uterine cancer or ovarian cancer, talk with your health care provider about genetic screening.  If you have vaginal bleeding after reaching menopause, tell your health care provider.  There are currently no reliable tests available to screen for ovarian cancer.  Lung Cancer Lung cancer screening is recommended for adults 69-62 years old who are at high risk for lung cancer because of a history of smoking. A yearly low-dose CT scan of the lungs is recommended if you:  Currently smoke.  Have a history of at least 30 pack-years of smoking and you currently smoke or have quit within the past 15 years. A pack-year is smoking an average of one pack of cigarettes per day for one year.  Yearly screening should:  Continue until it has been 15 years since you quit.  Stop if you develop a health problem that would prevent you from having lung cancer treatment.  Colorectal Cancer  This type of cancer can be detected and can often be prevented.  Routine colorectal cancer screening usually begins at  age 42 and continues through age 45.  If you have risk factors for colon cancer, your health care provider may recommend that you be screened at an earlier age.  If you have a family history of colorectal cancer, talk with your health care provider about genetic screening.  Your health care provider may also recommend using home test kits to check for hidden blood in your stool.  A small camera at the end of a tube can be used to examine your colon directly (sigmoidoscopy or colonoscopy). This is done to check for the earliest forms of colorectal cancer.  Direct examination of the colon should be repeated every  5-10 years until age 71. However, if early forms of precancerous polyps or small growths are found or if you have a family history or genetic risk for colorectal cancer, you may need to be screened more often.  Skin Cancer  Check your skin from head to toe regularly.  Monitor any moles. Be sure to tell your health care provider: ? About any new moles or changes in moles, especially if there is a change in a mole's shape or color. ? If you have a mole that is larger than the size of a pencil eraser.  If any of your family members has a history of skin cancer, especially at a young age, talk with your health care provider about genetic screening.  Always use sunscreen. Apply sunscreen liberally and repeatedly throughout the day.  Whenever you are outside, protect yourself by wearing long sleeves, pants, a wide-brimmed hat, and sunglasses.  What should I know about osteoporosis? Osteoporosis is a condition in which bone destruction happens more quickly than new bone creation. After menopause, you may be at an increased risk for osteoporosis. To help prevent osteoporosis or the bone fractures that can happen because of osteoporosis, the following is recommended:  If you are 46-71 years old, get at least 1,000 mg of calcium and at least 600 mg of vitamin D per day.  If you are older than age 55 but younger than age 65, get at least 1,200 mg of calcium and at least 600 mg of vitamin D per day.  If you are older than age 54, get at least 1,200 mg of calcium and at least 800 mg of vitamin D per day.  Smoking and excessive alcohol intake increase the risk of osteoporosis. Eat foods that are rich in calcium and vitamin D, and do weight-bearing exercises several times each week as directed by your health care provider. What should I know about how menopause affects my mental health? Depression may occur at any age, but it is more common as you become older. Common symptoms of depression  include:  Low or sad mood.  Changes in sleep patterns.  Changes in appetite or eating patterns.  Feeling an overall lack of motivation or enjoyment of activities that you previously enjoyed.  Frequent crying spells.  Talk with your health care provider if you think that you are experiencing depression. What should I know about immunizations? It is important that you get and maintain your immunizations. These include:  Tetanus, diphtheria, and pertussis (Tdap) booster vaccine.  Influenza every year before the flu season begins.  Pneumonia vaccine.  Shingles vaccine.  Your health care provider may also recommend other immunizations. This information is not intended to replace advice given to you by your health care provider. Make sure you discuss any questions you have with your health care provider. Document Released: 04/19/2005  Document Revised: 09/15/2015 Document Reviewed: 11/29/2014 Elsevier Interactive Patient Education  2018 [employer]  

## 2016-11-08 NOTE — Patient Instructions (Signed)
Kathleen Buchanan , Thank you for taking time to come for your Medicare Wellness Visit. I appreciate your ongoing commitment to your health goals. Please review the following plan we discussed and let me know if I can assist you in the future.   Screening recommendations/referrals: Colonoscopy: declined Mammogram: up to date Bone Density: N/A Recommended yearly ophthalmology/optometry visit for glaucoma screening and checkup Recommended yearly dental visit for hygiene and checkup  Vaccinations: Influenza vaccine: declined Pneumococcal vaccine: N/A Tdap vaccine: declined Shingles vaccine: N/A  Advanced directives: Advance directive discussed with you today. Even though you declined this today please call our office should you change your mind and we can give you the proper paperwork for you to fill out.  Conditions/risks identified: Recommend to quit smoking. Pt to cut back from 2 cigarettes a day to none.   Next appointment: None, need to schedule 1 year AWV.  Preventive Care 40-64 Years, Female Preventive care refers to lifestyle choices and visits with your health care provider that can promote health and wellness. What does preventive care include?  A yearly physical exam. This is also called an annual well check.  Dental exams once or twice a year.  Routine eye exams. Ask your health care provider how often you should have your eyes checked.  Personal lifestyle choices, including:  Daily care of your teeth and gums.  Regular physical activity.  Eating a healthy diet.  Avoiding tobacco and drug use.  Limiting alcohol use.  Practicing safe sex.  Taking low-dose aspirin daily starting at age 60.  Taking vitamin and mineral supplements as recommended by your health care provider. What happens during an annual well check? The services and screenings done by your health care provider during your annual well check will depend on your age, overall health, lifestyle risk  factors, and family history of disease. Counseling  Your health care provider may ask you questions about your:  Alcohol use.  Tobacco use.  Drug use.  Emotional well-being.  Home and relationship well-being.  Sexual activity.  Eating habits.  Work and work Astronomer.  Method of birth control.  Menstrual cycle.  Pregnancy history. Screening  You may have the following tests or measurements:  Height, weight, and BMI.  Blood pressure.  Lipid and cholesterol levels. These may be checked every 5 years, or more frequently if you are over 54 years old.  Skin check.  Lung cancer screening. You may have this screening every year starting at age 75 if you have a 30-pack-year history of smoking and currently smoke or have quit within the past 15 years.  Fecal occult blood test (FOBT) of the stool. You may have this test every year starting at age 75.  Flexible sigmoidoscopy or colonoscopy. You may have a sigmoidoscopy every 5 years or a colonoscopy every 10 years starting at age 39.  Hepatitis C blood test.  Hepatitis B blood test.  Sexually transmitted disease (STD) testing.  Diabetes screening. This is done by checking your blood sugar (glucose) after you have not eaten for a while (fasting). You may have this done every 1-3 years.  Mammogram. This may be done every 1-2 years. Talk to your health care provider about when you should start having regular mammograms. This may depend on whether you have a family history of breast cancer.  BRCA-related cancer screening. This may be done if you have a family history of breast, ovarian, tubal, or peritoneal cancers.  Pelvic exam and Pap test. This may be done  every 3 years starting at age 51. Starting at age 4, this may be done every 5 years if you have a Pap test in combination with an HPV test.  Bone density scan. This is done to screen for osteoporosis. You may have this scan if you are at high risk for  osteoporosis. Discuss your test results, treatment options, and if necessary, the need for more tests with your health care provider. Vaccines  Your health care provider may recommend certain vaccines, such as:  Influenza vaccine. This is recommended every year.  Tetanus, diphtheria, and acellular pertussis (Tdap, Td) vaccine. You may need a Td booster every 10 years.  Zoster vaccine. You may need this after age 18.  Pneumococcal 13-valent conjugate (PCV13) vaccine. You may need this if you have certain conditions and were not previously vaccinated.  Pneumococcal polysaccharide (PPSV23) vaccine. You may need one or two doses if you smoke cigarettes or if you have certain conditions. Talk to your health care provider about which screenings and vaccines you need and how often you need them. This information is not intended to replace advice given to you by your health care provider. Make sure you discuss any questions you have with your health care provider. Document Released: 03/24/2015 Document Revised: 11/15/2015 Document Reviewed: 12/27/2014 Elsevier Interactive Patient Education  2017 [place] Prevention in the Home Falls can cause injuries. They can happen to people of all ages. There are many things you can do to make your home safe and to help prevent falls. What can I do on the outside of my home?  Regularly fix the edges of walkways and driveways and fix any cracks.  Remove anything that might make you trip as you walk through a door, such as a raised step or threshold.  Trim any bushes or trees on the path to your home.  Use bright outdoor lighting.  Clear any walking paths of anything that might make someone trip, such as rocks or tools.  Regularly check to see if handrails are loose or broken. Make sure that both sides of any steps have handrails.  Any raised decks and porches should have guardrails on the edges.  Have any leaves, snow, or ice cleared  regularly.  Use sand or salt on walking paths during winter.  Clean up any spills in your garage right away. This includes oil or grease spills. What can I do in the bathroom?  Use night lights.  Install grab bars by the toilet and in the tub and shower. Do not use towel bars as grab bars.  Use non-skid mats or decals in the tub or shower.  If you need to sit down in the shower, use a plastic, non-slip stool.  Keep the floor dry. Clean up any water that spills on the floor as soon as it happens.  Remove soap buildup in the tub or shower regularly.  Attach bath mats securely with double-sided non-slip rug tape.  Do not have throw rugs and other things on the floor that can make you trip. What can I do in the bedroom?  Use night lights.  Make sure that you have a light by your bed that is easy to reach.  Do not use any sheets or blankets that are too big for your bed. They should not hang down onto the floor.  Have a firm chair that has side arms. You can use this for support while you get dressed.  Do not have  throw rugs and other things on the floor that can make you trip. What can I do in the kitchen?  Clean up any spills right away.  Avoid walking on wet floors.  Keep items that you use a lot in easy-to-reach places.  If you need to reach something above you, use a strong step stool that has a grab bar.  Keep electrical cords out of the way.  Do not use floor polish or wax that makes floors slippery. If you must use wax, use non-skid floor wax.  Do not have throw rugs and other things on the floor that can make you trip. What can I do with my stairs?  Do not leave any items on the stairs.  Make sure that there are handrails on both sides of the stairs and use them. Fix handrails that are broken or loose. Make sure that handrails are as long as the stairways.  Check any carpeting to make sure that it is firmly attached to the stairs. Fix any carpet that is loose  or worn.  Avoid having throw rugs at the top or bottom of the stairs. If you do have throw rugs, attach them to the floor with carpet tape.  Make sure that you have a light switch at the top of the stairs and the bottom of the stairs. If you do not have them, ask someone to add them for you. What else can I do to help prevent falls?  Wear shoes that:  Do not have high heels.  Have rubber bottoms.  Are comfortable and fit you well.  Are closed at the toe. Do not wear sandals.  If you use a stepladder:  Make sure that it is fully opened. Do not climb a closed stepladder.  Make sure that both sides of the stepladder are locked into place.  Ask someone to hold it for you, if possible.  Clearly mark and make sure that you can see:  Any grab bars or handrails.  First and last steps.  Where the edge of each step is.  Use tools that help you move around (mobility aids) if they are needed. These include:  Canes.  Walkers.  Scooters.  Crutches.  Turn on the lights when you go into a dark area. Replace any light bulbs as soon as they burn out.  Set up your furniture so you have a clear path. Avoid moving your furniture around.  If any of your floors are uneven, fix them.  If there are any pets around you, be aware of where they are.  Review your medicines with your doctor. Some medicines can make you feel dizzy. This can increase your chance of falling. Ask your doctor what other things that you can do to help prevent falls. This information is not intended to replace advice given to you by your health care provider. Make sure you discuss any questions you have with your health care provider. Document Released: 12/22/2008 Document Revised: 08/03/2015 Document Reviewed: 04/01/2014 Elsevier Interactive Patient Education  2017 [employer].

## 2016-11-09 ENCOUNTER — Other Ambulatory Visit: Payer: Self-pay | Admitting: Physician Assistant

## 2016-11-09 DIAGNOSIS — K219 Gastro-esophageal reflux disease without esophagitis: Secondary | ICD-10-CM

## 2016-12-06 ENCOUNTER — Telehealth: Payer: Self-pay | Admitting: Physician Assistant

## 2016-12-06 DIAGNOSIS — B379 Candidiasis, unspecified: Secondary | ICD-10-CM

## 2016-12-06 DIAGNOSIS — J0140 Acute pansinusitis, unspecified: Secondary | ICD-10-CM

## 2016-12-06 DIAGNOSIS — T3695XA Adverse effect of unspecified systemic antibiotic, initial encounter: Secondary | ICD-10-CM

## 2016-12-06 MED ORDER — AMOXICILLIN-POT CLAVULANATE 875-125 MG PO TABS: 1.0000 | ORAL_TABLET | Freq: Two times a day (BID) | ORAL | 0 refills | Status: DC

## 2016-12-06 MED ORDER — FLUCONAZOLE 150 MG PO TABS: 150.0000 mg | ORAL_TABLET | Freq: Once | ORAL | 0 refills | Status: AC

## 2016-12-06 NOTE — Telephone Encounter (Signed)
Patient advised. Per patient is it possible if medication for yeast can be send also?Marland Kitchen  Thanks,  -Joseline

## 2016-12-06 NOTE — Telephone Encounter (Signed)
Please Review.  Thanks,  -Joseline 

## 2016-12-06 NOTE — Telephone Encounter (Signed)
Pt is out of town until next week and she says she has a sinus infection and would like an antibiotic called into [facility]  [phone]  Pt's call back is [phone]  Thanks teri

## 2016-12-06 NOTE — Telephone Encounter (Signed)
Rx sent 

## 2016-12-06 NOTE — Telephone Encounter (Signed)
Sent in

## 2016-12-09 ENCOUNTER — Other Ambulatory Visit: Payer: Self-pay | Admitting: Physician Assistant

## 2016-12-09 DIAGNOSIS — K219 Gastro-esophageal reflux disease without esophagitis: Secondary | ICD-10-CM

## 2016-12-09 NOTE — Telephone Encounter (Signed)
[facility] faxed a refill request on the following medications:  pantoprazole (PROTONIX) 40 MG tablet.  90 day supply.  [facility] [place],  [place]

## 2016-12-10 MED ORDER — PANTOPRAZOLE SODIUM 40 MG PO TBEC: 40.0000 mg | DELAYED_RELEASE_TABLET | Freq: Every day | ORAL | 1 refills | Status: DC

## 2016-12-11 ENCOUNTER — Other Ambulatory Visit: Payer: Self-pay | Admitting: Physician Assistant

## 2016-12-11 DIAGNOSIS — F419 Anxiety disorder, unspecified: Secondary | ICD-10-CM

## 2016-12-11 DIAGNOSIS — M778 Other enthesopathies, not elsewhere classified: Secondary | ICD-10-CM

## 2016-12-11 DIAGNOSIS — F900 Attention-deficit hyperactivity disorder, predominantly inattentive type: Secondary | ICD-10-CM

## 2016-12-11 MED ORDER — AMPHETAMINE-DEXTROAMPHETAMINE 30 MG PO TABS: 1.0000 | ORAL_TABLET | Freq: Two times a day (BID) | ORAL | 0 refills | Status: DC

## 2016-12-11 MED ORDER — OXYCODONE-ACETAMINOPHEN 7.5-325 MG PO TABS: 1.0000 | ORAL_TABLET | Freq: Two times a day (BID) | ORAL | 0 refills | Status: DC

## 2016-12-11 NOTE — Telephone Encounter (Signed)
Pt contacted office for refill request on the following medications:  1. amphetamine-dextroamphetamine (ADDERALL) 30 MG tablet  2. oxyCODONE-acetaminophen (ENDOCET) 7.5-325 MG tablet   Last Rx: 11/08/16 LOV: 11/08/16  Please advise. Thanks TNP

## 2016-12-11 NOTE — Telephone Encounter (Signed)
[facility] reviewed. Rx printed. 

## 2016-12-11 NOTE — Telephone Encounter (Signed)
Rx printed

## 2016-12-11 NOTE — Telephone Encounter (Signed)
Patient advised that prescription is placed up front ready for pick up.  Thanks,  -Jezebelle Ledwell

## 2016-12-26 ENCOUNTER — Telehealth: Payer: Self-pay

## 2016-12-26 NOTE — Telephone Encounter (Signed)
lmtcb to schedule appointment for CPE as pt needs pap, colonoscopy, flu shot.

## 2017-01-07 ENCOUNTER — Other Ambulatory Visit: Payer: Self-pay | Admitting: Physician Assistant

## 2017-01-07 DIAGNOSIS — R609 Edema, unspecified: Secondary | ICD-10-CM

## 2017-01-10 ENCOUNTER — Other Ambulatory Visit: Payer: Self-pay | Admitting: Physician Assistant

## 2017-01-10 DIAGNOSIS — F900 Attention-deficit hyperactivity disorder, predominantly inattentive type: Secondary | ICD-10-CM

## 2017-01-10 DIAGNOSIS — M778 Other enthesopathies, not elsewhere classified: Secondary | ICD-10-CM

## 2017-01-10 MED ORDER — OXYCODONE-ACETAMINOPHEN 7.5-325 MG PO TABS: 1.0000 | ORAL_TABLET | Freq: Two times a day (BID) | ORAL | 0 refills | Status: DC

## 2017-01-10 MED ORDER — AMPHETAMINE-DEXTROAMPHETAMINE 30 MG PO TABS: 1.0000 | ORAL_TABLET | Freq: Two times a day (BID) | ORAL | 0 refills | Status: DC

## 2017-01-10 NOTE — Telephone Encounter (Signed)
Patient advised that prescription is placed up front ready for picj up.  Thanks,  -Joseline

## 2017-01-10 NOTE — Telephone Encounter (Signed)
Pt contacted office for refill request on the following medications:  oxyCODONE-acetaminophen (ENDOCET) 7.5-325 MG tablet   amphetamine-dextroamphetamine (ADDERALL) 30 MG tablet  CB#[id number]

## 2017-01-10 NOTE — Telephone Encounter (Signed)
[place] reviewed. Both Rx printed.

## 2017-01-10 NOTE — Telephone Encounter (Signed)
Last fill for both 12/11/16-Anastasiya V Hopkins, RMA

## 2017-02-12 ENCOUNTER — Ambulatory Visit (INDEPENDENT_AMBULATORY_CARE_PROVIDER_SITE_OTHER): Payer: Medicare Other | Admitting: Physician Assistant

## 2017-02-12 ENCOUNTER — Encounter: Payer: Self-pay | Admitting: Physician Assistant

## 2017-02-12 VITALS — BP 100/60 | HR 58 | Temp 97.7°F | Resp 16 | Ht 62.0 in | Wt 208.0 lb

## 2017-02-12 DIAGNOSIS — M778 Other enthesopathies, not elsewhere classified: Secondary | ICD-10-CM

## 2017-02-12 DIAGNOSIS — J0140 Acute pansinusitis, unspecified: Secondary | ICD-10-CM

## 2017-02-12 DIAGNOSIS — B379 Candidiasis, unspecified: Secondary | ICD-10-CM | POA: Diagnosis not present

## 2017-02-12 DIAGNOSIS — F900 Attention-deficit hyperactivity disorder, predominantly inattentive type: Secondary | ICD-10-CM | POA: Diagnosis not present

## 2017-02-12 DIAGNOSIS — T3695XA Adverse effect of unspecified systemic antibiotic, initial encounter: Secondary | ICD-10-CM

## 2017-02-12 MED ORDER — OXYCODONE-ACETAMINOPHEN 7.5-325 MG PO TABS: 1.0000 | ORAL_TABLET | Freq: Two times a day (BID) | ORAL | 0 refills | Status: DC

## 2017-02-12 MED ORDER — AMPHETAMINE-DEXTROAMPHETAMINE 30 MG PO TABS: 1.0000 | ORAL_TABLET | Freq: Two times a day (BID) | ORAL | 0 refills | Status: DC

## 2017-02-12 MED ORDER — FLUCONAZOLE 150 MG PO TABS: 150.0000 mg | ORAL_TABLET | Freq: Once | ORAL | 0 refills | Status: AC

## 2017-02-12 MED ORDER — AMOXICILLIN-POT CLAVULANATE 875-125 MG PO TABS: 1.0000 | ORAL_TABLET | Freq: Two times a day (BID) | ORAL | 0 refills | Status: DC

## 2017-02-12 NOTE — Progress Notes (Signed)
Patient: Kathleen Buchanan Female    DOB: 03-21-1965   51 y.o.   MRN: [id number] Visit Date: 02/12/2017  Today's Provider: Mar Daring, PA-C   Chief Complaint  Patient presents with  . URI   Subjective:    HPI Upper Respiratory Infection: Patient complains of symptoms of a URI. Symptoms include congestion. Onset of symptoms was 1 week ago, unchanged since that time. She also c/o facial pain, ear popping and headaches for the past 5 days .  She is drinking plenty of fluids. Evaluation to date: none. Treatment to date: decongestants.  Patient reports that on Saturday she had a "stye" on her left eye, pt reports it popped and not sure if it had anything to do with her sinus.      Allergies  Allergen Reactions  . Codeine Hives  . Erythromycin   . Hydrocodone-Acetaminophen Hives  . Lansoprazole   . Levofloxacin   . Migraine Formula  [Aspirin-Acetaminophen-Caffeine]   . Morphine Sulfate   . Omeprazole   . Pepcid  [Famotidine]   . Sulfa Antibiotics      Current Outpatient Medications:  .  albuterol (PROVENTIL HFA;VENTOLIN HFA) 108 (90 Base) MCG/ACT inhaler, Inhale 2 puffs into the lungs every 4 (four) hours as needed for wheezing or shortness of breath., Disp: 1 Inhaler, Rfl: 5 .  ALPRAZolam (XANAX) 1 MG tablet, TAKE 1 TABLET BY MOUTH TWICE A DAY, Disp: 60 tablet, Rfl: 5 .  amphetamine-dextroamphetamine (ADDERALL) 30 MG tablet, Take 1 tablet by mouth 2 (two) times daily., Disp: 60 tablet, Rfl: 0 .  ascorbic acid (VITAMIN C) 500 MG tablet, Take 1,000 mg by mouth daily. , Disp: , Rfl:  .  ibuprofen (ADVIL,MOTRIN) 200 MG tablet, Take 4 tablets by mouth as needed., Disp: , Rfl:  .  oxyCODONE-acetaminophen (ENDOCET) 7.5-325 MG tablet, Take 1 tablet by mouth 2 (two) times daily., Disp: 60 tablet, Rfl: 0 .  pantoprazole (PROTONIX) 40 MG tablet, Take 1 tablet (40 mg total) by mouth daily., Disp: 90 tablet, Rfl: 1 .  polyethylene glycol powder (GLYCOLAX/MIRALAX) powder, Take  17 g by mouth 2 (two) times daily as needed., Disp: 3350 g, Rfl: 1 .  buPROPion (WELLBUTRIN SR) 150 MG 12 hr tablet, Take 1 tablet (150 mg total) by mouth 2 (two) times daily. (Patient not taking: Reported on 02/12/2017), Disp: 180 tablet, Rfl: 1 .  hydrochlorothiazide (HYDRODIURIL) 12.5 MG tablet, TAKE 1 TABLET BY MOUTH DAILY. (Patient not taking: Reported on 02/12/2017), Disp: 90 tablet, Rfl: 1 .  SYNTHROID 25 MCG tablet, Take 1 tablet (25 mcg total) by mouth daily before breakfast. Brand only (Patient not taking: Reported on 02/12/2017), Disp: 90 tablet, Rfl: 1  Review of Systems  HENT: Positive for congestion, ear pain, nosebleeds, postnasal drip, rhinorrhea, sinus pressure, sinus pain and sore throat. Negative for sneezing, tinnitus and trouble swallowing.   Respiratory: Negative.   Cardiovascular: Negative.   Gastrointestinal: Negative for abdominal pain and nausea.  Neurological: Positive for dizziness and headaches. Negative for light-headedness.    Social History   Tobacco Use  . Smoking status: Former Smoker    Packs/day: 0.25    Types: Cigarettes    Last attempt to quit: 10/09/2016    Years since quitting: 0.3  . Smokeless tobacco: Never Used  . Tobacco comment: 2 cigarettes a day  Substance Use Topics  . Alcohol use: No    Alcohol/week: 0.0 oz   Objective:   BP 100/60 (BP Location: Left  Arm, Patient Position: Sitting, Cuff Size: Large)   Pulse (!) 58   Temp 97.7 F (36.5 C) (Oral)   Resp 16   Ht 5\' 2"  (1.575 m)   Wt 208 lb (94.3 kg)   SpO2 96%   BMI 38.04 kg/m  Vitals:   02/12/17 0836  BP: 100/60  Pulse: (!) 58  Resp: 16  Temp: 97.7 F (36.5 C)  TempSrc: Oral  SpO2: 96%  Weight: 208 lb (94.3 kg)  Height: 5\' 2"  (1.575 m)     Physical Exam  Constitutional: She appears well-developed and well-nourished. No distress.  HENT:  Head: Normocephalic and atraumatic.  Right Ear: Hearing, tympanic membrane, external ear and ear canal normal.  Left Ear: Hearing,  tympanic membrane, external ear and ear canal normal.  Nose: Right sinus exhibits maxillary sinus tenderness and frontal sinus tenderness. Left sinus exhibits maxillary sinus tenderness and frontal sinus tenderness.  Mouth/Throat: Uvula is midline, oropharynx is clear and moist and mucous membranes are normal. No oropharyngeal exudate.  Neck: Normal range of motion. Neck supple. No tracheal deviation present. No thyromegaly present.  Cardiovascular: Normal rate, regular rhythm and normal heart sounds. Exam reveals no gallop and no friction rub.  No murmur heard. Pulmonary/Chest: Effort normal and breath sounds normal. No stridor. No respiratory distress. She has no wheezes. She has no rales.  Lymphadenopathy:    She has no cervical adenopathy.  Skin: She is not diaphoretic.  Vitals reviewed.     Assessment & Plan:     1. Acute pansinusitis, recurrence not specified Worsening symptoms that have not responded to OTC medications. Will give augmentin as below. Continue allergy medications. Stay well hydrated and get plenty of rest. Call if no symptom improvement or if symptoms worsen. - amoxicillin-clavulanate (AUGMENTIN) 875-125 MG tablet; Take 1 tablet by mouth 2 (two) times daily.  Dispense: 20 tablet; Refill: 0  2. Attention deficit hyperactivity disorder (ADHD), predominantly inattentive type Stable. Diagnosis pulled for medication refill. Continue current medical treatment plan. - amphetamine-dextroamphetamine (ADDERALL) 30 MG tablet; Take 1 tablet by mouth 2 (two) times daily.  Dispense: 60 tablet; Refill: 0  3. Tendinitis of wrist Stable. Diagnosis pulled for medication refill. Continue current medical treatment plan. - oxyCODONE-acetaminophen (ENDOCET) 7.5-325 MG tablet; Take 1 tablet by mouth 2 (two) times daily.  Dispense: 60 tablet; Refill: 0  4. Antibiotic-induced yeast infection Gets yeast infections with augmentin use. Will give diflucan as below.  - fluconazole (DIFLUCAN) 150  MG tablet; Take 1 tablet (150 mg total) by mouth once for 1 dose. May repeat in 48-72 hrs if needed  Dispense: 3 tablet; Refill: 0       Mar Daring, PA-C  [facility]

## 2017-02-12 NOTE — Patient Instructions (Signed)

## 2017-03-12 ENCOUNTER — Telehealth: Payer: Self-pay | Admitting: Physician Assistant

## 2017-03-12 DIAGNOSIS — F900 Attention-deficit hyperactivity disorder, predominantly inattentive type: Secondary | ICD-10-CM

## 2017-03-12 DIAGNOSIS — M778 Other enthesopathies, not elsewhere classified: Secondary | ICD-10-CM

## 2017-03-12 MED ORDER — OXYCODONE-ACETAMINOPHEN 7.5-325 MG PO TABS: 1.0000 | ORAL_TABLET | Freq: Two times a day (BID) | ORAL | 0 refills | Status: DC

## 2017-03-12 MED ORDER — AMPHETAMINE-DEXTROAMPHETAMINE 30 MG PO TABS: 1.0000 | ORAL_TABLET | Freq: Two times a day (BID) | ORAL | 0 refills | Status: DC

## 2017-03-12 NOTE — Telephone Encounter (Signed)
[place] reviewed. Rx printed for each

## 2017-03-12 NOTE — Telephone Encounter (Signed)
Patient needs refill on Oxycodone and Adderall   [facility]

## 2017-03-13 NOTE — Telephone Encounter (Signed)
No answer. Prescription is placed up front ready for pick up.  Thanks,  -Jastin Fore

## 2017-04-02 NOTE — Telephone Encounter (Signed)
Closing out.

## 2017-04-11 ENCOUNTER — Other Ambulatory Visit: Payer: Self-pay | Admitting: Physician Assistant

## 2017-04-11 DIAGNOSIS — F900 Attention-deficit hyperactivity disorder, predominantly inattentive type: Secondary | ICD-10-CM

## 2017-04-11 DIAGNOSIS — M778 Other enthesopathies, not elsewhere classified: Secondary | ICD-10-CM

## 2017-04-11 MED ORDER — OXYCODONE-ACETAMINOPHEN 7.5-325 MG PO TABS: 1.0000 | ORAL_TABLET | Freq: Two times a day (BID) | ORAL | 0 refills | Status: DC

## 2017-04-11 MED ORDER — AMPHETAMINE-DEXTROAMPHETAMINE 30 MG PO TABS: 1.0000 | ORAL_TABLET | Freq: Two times a day (BID) | ORAL | 0 refills | Status: DC

## 2017-04-11 NOTE — Telephone Encounter (Signed)
[facility] reviewed. Rx printed. 

## 2017-04-11 NOTE — Telephone Encounter (Signed)
Patient is requesting a refill on the following medications  amphetamine-dextroamphetamine (ADDERALL) 30 MG tablet  oxyCODONE-acetaminophen (ENDOCET) 7.5-325 MG tablet  [facility]

## 2017-04-11 NOTE — Telephone Encounter (Signed)
Patient advised that prescription is placed up front ready for pick up.  Thanks,  -Joseline

## 2017-05-09 ENCOUNTER — Telehealth: Payer: Self-pay | Admitting: Physician Assistant

## 2017-05-09 ENCOUNTER — Other Ambulatory Visit: Payer: Self-pay | Admitting: Physician Assistant

## 2017-05-09 DIAGNOSIS — M778 Other enthesopathies, not elsewhere classified: Secondary | ICD-10-CM

## 2017-05-09 DIAGNOSIS — F900 Attention-deficit hyperactivity disorder, predominantly inattentive type: Secondary | ICD-10-CM

## 2017-05-09 DIAGNOSIS — E039 Hypothyroidism, unspecified: Secondary | ICD-10-CM

## 2017-05-09 MED ORDER — OXYCODONE-ACETAMINOPHEN 7.5-325 MG PO TABS: 1.0000 | ORAL_TABLET | Freq: Two times a day (BID) | ORAL | 0 refills | Status: DC

## 2017-05-09 MED ORDER — AMPHETAMINE-DEXTROAMPHETAMINE 30 MG PO TABS: 1.0000 | ORAL_TABLET | Freq: Two times a day (BID) | ORAL | 0 refills | Status: DC

## 2017-05-09 NOTE — Telephone Encounter (Signed)
Patient requesting refill on her amphetamine-dextroamphetamine (ADDERALL) 30 MG tablet   oxyCODONE-acetaminophen (ENDOCET) 7.5-325 MG tablet

## 2017-05-09 NOTE — Telephone Encounter (Signed)
Patient advised that prescription placed up front ready for pick up.  Thanks,  -Joseline 

## 2017-05-09 NOTE — Telephone Encounter (Signed)
[facility] reviewed. Rx printed. 

## 2017-06-07 ENCOUNTER — Other Ambulatory Visit: Payer: Self-pay | Admitting: Physician Assistant

## 2017-06-07 DIAGNOSIS — F322 Major depressive disorder, single episode, severe without psychotic features: Secondary | ICD-10-CM

## 2017-06-08 ENCOUNTER — Other Ambulatory Visit: Payer: Self-pay | Admitting: Physician Assistant

## 2017-06-08 DIAGNOSIS — K219 Gastro-esophageal reflux disease without esophagitis: Secondary | ICD-10-CM

## 2017-06-12 ENCOUNTER — Encounter: Payer: Self-pay | Admitting: Physician Assistant

## 2017-06-12 ENCOUNTER — Ambulatory Visit (INDEPENDENT_AMBULATORY_CARE_PROVIDER_SITE_OTHER): Payer: Medicare Other | Admitting: Physician Assistant

## 2017-06-12 VITALS — BP 90/60 | HR 68 | Temp 97.8°F | Resp 16

## 2017-06-12 DIAGNOSIS — F900 Attention-deficit hyperactivity disorder, predominantly inattentive type: Secondary | ICD-10-CM

## 2017-06-12 DIAGNOSIS — T3695XA Adverse effect of unspecified systemic antibiotic, initial encounter: Secondary | ICD-10-CM

## 2017-06-12 DIAGNOSIS — F419 Anxiety disorder, unspecified: Secondary | ICD-10-CM

## 2017-06-12 DIAGNOSIS — M778 Other enthesopathies, not elsewhere classified: Secondary | ICD-10-CM | POA: Diagnosis not present

## 2017-06-12 DIAGNOSIS — J0140 Acute pansinusitis, unspecified: Secondary | ICD-10-CM

## 2017-06-12 DIAGNOSIS — B379 Candidiasis, unspecified: Secondary | ICD-10-CM | POA: Diagnosis not present

## 2017-06-12 MED ORDER — AMOXICILLIN-POT CLAVULANATE 875-125 MG PO TABS: 1.0000 | ORAL_TABLET | Freq: Two times a day (BID) | ORAL | 0 refills | Status: DC

## 2017-06-12 MED ORDER — FLUCONAZOLE 150 MG PO TABS: 150.0000 mg | ORAL_TABLET | Freq: Once | ORAL | 0 refills | Status: AC

## 2017-06-12 MED ORDER — AMPHETAMINE-DEXTROAMPHETAMINE 30 MG PO TABS: 1.0000 | ORAL_TABLET | Freq: Two times a day (BID) | ORAL | 0 refills | Status: DC

## 2017-06-12 MED ORDER — OXYCODONE-ACETAMINOPHEN 7.5-325 MG PO TABS: 1.0000 | ORAL_TABLET | Freq: Two times a day (BID) | ORAL | 0 refills | Status: DC

## 2017-06-12 MED ORDER — ALPRAZOLAM 1 MG PO TABS: 1.0000 mg | ORAL_TABLET | Freq: Two times a day (BID) | ORAL | 5 refills | Status: DC

## 2017-06-12 NOTE — Patient Instructions (Signed)

## 2017-06-12 NOTE — Progress Notes (Signed)
Patient: Kathleen Buchanan Female    DOB: [birth year]   52 y.o.   MRN: [id number] Visit Date: 06/12/2017  Today's Provider: Mar Daring, PA-C   Chief Complaint  Patient presents with  . URI   Subjective:    HPI Upper Respiratory Infection: Patient complains of symptoms of a URI, possible sinusitis. Symptoms include bilateral ear pain, congestion and cough. Onset of symptoms was 2 weeks ago, gradually worsening since that time. She also c/o bilateral ear pressure/pain, cough described as productive of green sputum, headache described as pressure, productive cough with  green colored sputum, shortness of breath, sinus pressure and wheezing for the past 2 weeks .  She is drinking plenty of fluids. Evaluation to date: none. Treatment to date: cough suppressants and decongestants. Mucinex  She is also requesting refills on her controlled medications.    Allergies  Allergen Reactions  . Codeine Hives  . Erythromycin   . Hydrocodone-Acetaminophen Hives  . Lansoprazole   . Levofloxacin   . Migraine Formula  [Aspirin-Acetaminophen-Caffeine]   . Morphine Sulfate   . Omeprazole   . Pepcid  [Famotidine]   . Sulfa Antibiotics      Current Outpatient Medications:  .  albuterol (PROVENTIL HFA;VENTOLIN HFA) 108 (90 Base) MCG/ACT inhaler, Inhale 2 puffs into the lungs every 4 (four) hours as needed for wheezing or shortness of breath., Disp: 1 Inhaler, Rfl: 5 .  ALPRAZolam (XANAX) 1 MG tablet, TAKE 1 TABLET BY MOUTH TWICE A DAY, Disp: 60 tablet, Rfl: 5 .  amphetamine-dextroamphetamine (ADDERALL) 30 MG tablet, Take 1 tablet by mouth 2 (two) times daily., Disp: 60 tablet, Rfl: 0 .  buPROPion (WELLBUTRIN SR) 150 MG 12 hr tablet, TAKE 1 TABLET BY MOUTH TWICE A DAY, Disp: 180 tablet, Rfl: 1 .  hydrochlorothiazide (HYDRODIURIL) 12.5 MG tablet, TAKE 1 TABLET BY MOUTH DAILY. (Patient taking differently: TAKE 1 TABLET BY MOUTH DAILY, PRN), Disp: 90 tablet, Rfl: 1 .  ibuprofen (ADVIL,MOTRIN)  200 MG tablet, Take 4 tablets by mouth as needed., Disp: , Rfl:  .  oxyCODONE-acetaminophen (ENDOCET) 7.5-325 MG tablet, Take 1 tablet by mouth 2 (two) times daily., Disp: 60 tablet, Rfl: 0 .  pantoprazole (PROTONIX) 40 MG tablet, TAKE 1 TABLET BY MOUTH EVERY DAY, Disp: 90 tablet, Rfl: 1 .  polyethylene glycol powder (GLYCOLAX/MIRALAX) powder, Take 17 g by mouth 2 (two) times daily as needed., Disp: 3350 g, Rfl: 1 .  ascorbic acid (VITAMIN C) 500 MG tablet, Take 1,000 mg by mouth daily. , Disp: , Rfl:  .  SYNTHROID 25 MCG tablet, TAKE 1 TABLET BY MOUTH EVERY DAY IN THE MORNING BEFORE BREAKFAST (Patient not taking: Reported on 06/12/2017), Disp: 90 tablet, Rfl: 1  Review of Systems  Constitutional: Negative.   HENT: Positive for congestion, ear pain, postnasal drip, sinus pressure and sinus pain.   Respiratory: Positive for cough, shortness of breath and wheezing.   Cardiovascular: Negative.   Gastrointestinal: Negative.   Neurological: Positive for dizziness and headaches.    Social History   Tobacco Use  . Smoking status: Former Smoker    Packs/day: 0.25    Types: Cigarettes    Last attempt to quit: 10/09/2016    Years since quitting: 0.6  . Smokeless tobacco: Never Used  . Tobacco comment: 2 cigarettes a day  Substance Use Topics  . Alcohol use: No    Alcohol/week: 0.0 oz   Objective:   BP 90/60 (BP Location: Left Arm, Patient Position: Sitting,  Cuff Size: Normal)   Pulse 68   Temp 97.8 F (36.6 C) (Oral)   Resp 16   SpO2 98%  Vitals:   06/12/17 0830  BP: 90/60  Pulse: 68  Resp: 16  Temp: 97.8 F (36.6 C)  TempSrc: Oral  SpO2: 98%     Physical Exam  Constitutional: She appears well-developed and well-nourished. No distress.  HENT:  Head: Normocephalic and atraumatic.  Right Ear: Hearing, tympanic membrane, external ear and ear canal normal.  Left Ear: Hearing, tympanic membrane, external ear and ear canal normal.  Nose: Mucosal edema present. Right sinus exhibits  maxillary sinus tenderness and frontal sinus tenderness. Left sinus exhibits maxillary sinus tenderness and frontal sinus tenderness.  Mouth/Throat: Uvula is midline, oropharynx is clear and moist and mucous membranes are normal. No oropharyngeal exudate.  Neck: Normal range of motion. Neck supple. No tracheal deviation present. No thyromegaly present.  Cardiovascular: Normal rate, regular rhythm and normal heart sounds. Exam reveals no gallop and no friction rub.  No murmur heard. Pulmonary/Chest: Effort normal and breath sounds normal. No stridor. No respiratory distress. She has no wheezes. She has no rales.  Lymphadenopathy:    She has no cervical adenopathy.  Skin: She is not diaphoretic.  Vitals reviewed.       Assessment & Plan:     1. Acute pansinusitis, recurrence not specified Worsening symptoms that have not responded to OTC medications. Will give augmentin as below. Continue allergy medications. Stay well hydrated and get plenty of rest. Call if no symptom improvement or if symptoms worsen. - amoxicillin-clavulanate (AUGMENTIN) 875-125 MG tablet; Take 1 tablet by mouth 2 (two) times daily.  Dispense: 20 tablet; Refill: 0  2. Antibiotic-induced yeast infection - fluconazole (DIFLUCAN) 150 MG tablet; Take 1 tablet (150 mg total) by mouth once for 1 dose. May repeat in 48-72 hrs if needed  Dispense: 3 tablet; Refill: 0  3. Tendinitis of wrist NCCSR reviewed. No concerns, only meds ordered by me. Stable. Diagnosis pulled for medication refill. Continue current medical treatment plan. - oxyCODONE-acetaminophen (ENDOCET) 7.5-325 MG tablet; Take 1 tablet by mouth 2 (two) times daily.  Dispense: 60 tablet; Refill: 0  4. Attention deficit hyperactivity disorder (ADHD), predominantly inattentive type Stable. Diagnosis pulled for medication refill. Continue current medical treatment plan. - amphetamine-dextroamphetamine (ADDERALL) 30 MG tablet; Take 1 tablet by mouth 2 (two) times  daily.  Dispense: 60 tablet; Refill: 0  5. Anxiety Stable. Diagnosis pulled for medication refill. Continue current medical treatment plan. - ALPRAZolam (XANAX) 1 MG tablet; Take 1 tablet (1 mg total) by mouth 2 (two) times daily.  Dispense: 60 tablet; Refill: [place], PA-C  [facility]

## 2017-07-09 ENCOUNTER — Other Ambulatory Visit: Payer: Self-pay | Admitting: Physician Assistant

## 2017-07-09 DIAGNOSIS — F900 Attention-deficit hyperactivity disorder, predominantly inattentive type: Secondary | ICD-10-CM

## 2017-07-09 DIAGNOSIS — M778 Other enthesopathies, not elsewhere classified: Secondary | ICD-10-CM

## 2017-07-09 DIAGNOSIS — R609 Edema, unspecified: Secondary | ICD-10-CM

## 2017-07-09 MED ORDER — AMPHETAMINE-DEXTROAMPHETAMINE 30 MG PO TABS: 1.0000 | ORAL_TABLET | Freq: Two times a day (BID) | ORAL | 0 refills | Status: DC

## 2017-07-09 MED ORDER — OXYCODONE-ACETAMINOPHEN 7.5-325 MG PO TABS: 1.0000 | ORAL_TABLET | Freq: Two times a day (BID) | ORAL | 0 refills | Status: DC

## 2017-07-09 NOTE — Telephone Encounter (Signed)
Pr requesting Adderall 30 MG and Oxycodone 7.5-325 MG [facility]

## 2017-08-04 DIAGNOSIS — H04302 Unspecified dacryocystitis of left lacrimal passage: Secondary | ICD-10-CM | POA: Diagnosis not present

## 2017-08-04 DIAGNOSIS — J329 Chronic sinusitis, unspecified: Secondary | ICD-10-CM | POA: Diagnosis not present

## 2017-08-04 DIAGNOSIS — R220 Localized swelling, mass and lump, head: Secondary | ICD-10-CM | POA: Diagnosis not present

## 2017-08-05 ENCOUNTER — Telehealth: Payer: Self-pay | Admitting: Physician Assistant

## 2017-08-05 NOTE — Telephone Encounter (Signed)
She is call the ER back and tell them she can't tolerate the medication. If unable to do that go the local Urgent Care.

## 2017-08-05 NOTE — Telephone Encounter (Signed)
pt is in [place] and had to go to the ER there for eye infection/  They prescribed Clindamycin 150 mg taking three tablets three times a day.  She is throwing up with that medication.  She can not come in because she is still out of town,  She ask for a nurse to call her back  Pt's call back is [phone]  [facility] in [place] on [address]  Thanks teri

## 2017-08-06 ENCOUNTER — Other Ambulatory Visit: Payer: Self-pay | Admitting: Physician Assistant

## 2017-08-06 DIAGNOSIS — M778 Other enthesopathies, not elsewhere classified: Secondary | ICD-10-CM

## 2017-08-06 DIAGNOSIS — F900 Attention-deficit hyperactivity disorder, predominantly inattentive type: Secondary | ICD-10-CM

## 2017-08-06 MED ORDER — OXYCODONE-ACETAMINOPHEN 7.5-325 MG PO TABS: 1.0000 | ORAL_TABLET | Freq: Two times a day (BID) | ORAL | 0 refills | Status: DC

## 2017-08-06 MED ORDER — AMPHETAMINE-DEXTROAMPHETAMINE 30 MG PO TABS: 1.0000 | ORAL_TABLET | Freq: Two times a day (BID) | ORAL | 0 refills | Status: DC

## 2017-08-06 NOTE — Telephone Encounter (Signed)
pt needs refill on   adderall 30 mg  Oxycodone 7.5  [facility]   Meds not due til the 6/1  thanks teri

## 2017-08-06 NOTE — Telephone Encounter (Signed)
Last RF 07/09/17 and last OV was 06/12/17.

## 2017-08-06 NOTE — Telephone Encounter (Signed)
Patient has been advised. KW 

## 2017-08-06 NOTE — Telephone Encounter (Signed)
NCCSR reviewed. Rx sent in. 

## 2017-09-05 ENCOUNTER — Other Ambulatory Visit: Payer: Self-pay | Admitting: Physician Assistant

## 2017-09-05 DIAGNOSIS — M778 Other enthesopathies, not elsewhere classified: Secondary | ICD-10-CM

## 2017-09-05 DIAGNOSIS — F419 Anxiety disorder, unspecified: Secondary | ICD-10-CM

## 2017-09-05 DIAGNOSIS — F900 Attention-deficit hyperactivity disorder, predominantly inattentive type: Secondary | ICD-10-CM

## 2017-09-05 MED ORDER — OXYCODONE-ACETAMINOPHEN 7.5-325 MG PO TABS: 1.0000 | ORAL_TABLET | Freq: Two times a day (BID) | ORAL | 0 refills | Status: DC

## 2017-09-05 MED ORDER — AMPHETAMINE-DEXTROAMPHETAMINE 30 MG PO TABS: 1.0000 | ORAL_TABLET | Freq: Two times a day (BID) | ORAL | 0 refills | Status: DC

## 2017-09-05 NOTE — Telephone Encounter (Signed)
Pt needs refill on her oxycodone 7.5-325  Adderall 30 mg  She uses [facility]

## 2017-10-06 ENCOUNTER — Telehealth: Payer: Self-pay | Admitting: Physician Assistant

## 2017-10-06 DIAGNOSIS — F900 Attention-deficit hyperactivity disorder, predominantly inattentive type: Secondary | ICD-10-CM

## 2017-10-06 DIAGNOSIS — M778 Other enthesopathies, not elsewhere classified: Secondary | ICD-10-CM

## 2017-10-06 MED ORDER — OXYCODONE-ACETAMINOPHEN 7.5-325 MG PO TABS: 1.0000 | ORAL_TABLET | Freq: Two times a day (BID) | ORAL | 0 refills | Status: DC

## 2017-10-06 MED ORDER — AMPHETAMINE-DEXTROAMPHETAMINE 30 MG PO TABS: 1.0000 | ORAL_TABLET | Freq: Two times a day (BID) | ORAL | 0 refills | Status: DC

## 2017-10-06 NOTE — Telephone Encounter (Signed)
Pt needs a refill on   Adderall 30mg   Oxycodone 7.5-325  [facility]

## 2017-10-06 NOTE — Telephone Encounter (Signed)
[place] reviewed. Refilled.

## 2017-10-24 DIAGNOSIS — H9203 Otalgia, bilateral: Secondary | ICD-10-CM | POA: Diagnosis not present

## 2017-10-24 DIAGNOSIS — J0140 Acute pansinusitis, unspecified: Secondary | ICD-10-CM | POA: Diagnosis not present

## 2017-10-31 ENCOUNTER — Other Ambulatory Visit: Payer: Self-pay | Admitting: Physician Assistant

## 2017-10-31 DIAGNOSIS — E039 Hypothyroidism, unspecified: Secondary | ICD-10-CM

## 2017-11-06 ENCOUNTER — Other Ambulatory Visit: Payer: Self-pay | Admitting: Physician Assistant

## 2017-11-06 DIAGNOSIS — M778 Other enthesopathies, not elsewhere classified: Secondary | ICD-10-CM

## 2017-11-06 DIAGNOSIS — F900 Attention-deficit hyperactivity disorder, predominantly inattentive type: Secondary | ICD-10-CM

## 2017-11-06 DIAGNOSIS — F419 Anxiety disorder, unspecified: Secondary | ICD-10-CM

## 2017-11-06 MED ORDER — AMPHETAMINE-DEXTROAMPHETAMINE 30 MG PO TABS: 1.0000 | ORAL_TABLET | Freq: Two times a day (BID) | ORAL | 0 refills | Status: DC

## 2017-11-06 MED ORDER — OXYCODONE-ACETAMINOPHEN 7.5-325 MG PO TABS: 1.0000 | ORAL_TABLET | Freq: Two times a day (BID) | ORAL | 0 refills | Status: DC

## 2017-11-06 MED ORDER — ALPRAZOLAM 1 MG PO TABS: 1.0000 mg | ORAL_TABLET | Freq: Two times a day (BID) | ORAL | 5 refills | Status: DC

## 2017-11-06 NOTE — Telephone Encounter (Signed)
Pt contacted office for refill request on the following medications:  1. amphetamine-dextroamphetamine (ADDERALL) 30 MG tablet 2. oxyCODONE-acetaminophen (ENDOCET) 7.5-325 MG tablet 3. ALPRAZolam Duanne Moron) 1 MG tablet  [facility]   LOV: 06/12/17  Pt is requesting [facility] because she now lives in [place] and last month the Rx was still sent to her old pharmacy in [place] and they are not able to transfer the Rx because of the type of medication. Pt stated it is an hour drive for her to go to [place] and she is trying to make sure the Rx is sent to [facility]. Please advise. Thanks TNP

## 2017-11-30 ENCOUNTER — Other Ambulatory Visit: Payer: Self-pay | Admitting: Physician Assistant

## 2017-11-30 DIAGNOSIS — F322 Major depressive disorder, single episode, severe without psychotic features: Secondary | ICD-10-CM

## 2017-12-01 ENCOUNTER — Other Ambulatory Visit: Payer: Self-pay | Admitting: Physician Assistant

## 2017-12-01 DIAGNOSIS — K219 Gastro-esophageal reflux disease without esophagitis: Secondary | ICD-10-CM

## 2017-12-08 ENCOUNTER — Other Ambulatory Visit: Payer: Self-pay | Admitting: Physician Assistant

## 2017-12-08 DIAGNOSIS — F900 Attention-deficit hyperactivity disorder, predominantly inattentive type: Secondary | ICD-10-CM

## 2017-12-08 MED ORDER — AMPHETAMINE-DEXTROAMPHETAMINE 30 MG PO TABS: 1.0000 | ORAL_TABLET | Freq: Two times a day (BID) | ORAL | 0 refills | Status: DC

## 2017-12-08 NOTE — Telephone Encounter (Signed)
Pt needing refills on: amphetamine-dextroamphetamine (ADDERALL) 30 MG tablet  Please fill at:  [id number] [place] [place] (Phone) [phone] (Fax)   Thanks, [place]

## 2017-12-09 ENCOUNTER — Other Ambulatory Visit: Payer: Self-pay | Admitting: Physician Assistant

## 2017-12-09 DIAGNOSIS — M778 Other enthesopathies, not elsewhere classified: Secondary | ICD-10-CM

## 2017-12-09 MED ORDER — OXYCODONE-ACETAMINOPHEN 7.5-325 MG PO TABS: 1.0000 | ORAL_TABLET | Freq: Two times a day (BID) | ORAL | 0 refills | Status: DC

## 2017-12-09 NOTE — Telephone Encounter (Signed)
Pt contacted office for refill request on the following medications:  oxyCODONE-acetaminophen (ENDOCET) 7.5-325 MG tablet  [facility]  Last Rx: 11/06/17  Pt stated she requested the refill yesterday when she requested the Adderall. Please advise. Thanks TNP

## 2017-12-10 ENCOUNTER — Telehealth: Payer: Self-pay

## 2017-12-10 ENCOUNTER — Ambulatory Visit: Payer: Medicare Other

## 2017-12-10 NOTE — Telephone Encounter (Signed)
Called pt to see about r/s missed AWV from 12/09/17 and she states she is in the process of moving and to [place] in a month. Advised her I would [place] in November and she agreed. Note made.  -MM

## 2018-01-01 ENCOUNTER — Other Ambulatory Visit: Payer: Self-pay | Admitting: Physician Assistant

## 2018-01-01 DIAGNOSIS — R609 Edema, unspecified: Secondary | ICD-10-CM

## 2018-01-07 ENCOUNTER — Other Ambulatory Visit: Payer: Self-pay | Admitting: Physician Assistant

## 2018-01-07 DIAGNOSIS — F900 Attention-deficit hyperactivity disorder, predominantly inattentive type: Secondary | ICD-10-CM

## 2018-01-07 DIAGNOSIS — M778 Other enthesopathies, not elsewhere classified: Secondary | ICD-10-CM

## 2018-01-07 MED ORDER — AMPHETAMINE-DEXTROAMPHETAMINE 30 MG PO TABS: 1.0000 | ORAL_TABLET | Freq: Two times a day (BID) | ORAL | 0 refills | Status: DC

## 2018-01-07 MED ORDER — OXYCODONE-ACETAMINOPHEN 7.5-325 MG PO TABS: 1.0000 | ORAL_TABLET | Freq: Two times a day (BID) | ORAL | 0 refills | Status: DC

## 2018-01-07 NOTE — Telephone Encounter (Signed)
Unable to reach patient at this time no voicemail has been set. KW

## 2018-01-07 NOTE — Telephone Encounter (Signed)
Has not seen Jenni in >6 months.  Needs to be seen regularly for refills on controlled substances, especially when taking multiple controlled substances.  Will give 1 week supply and then she will need to be seen by [facility] for further refills.  Virginia Crews, MD, MPH [facility] 01/07/2018 11:24 AM

## 2018-01-07 NOTE — Telephone Encounter (Signed)
Patient advised. Follow up scheduled for 01/16/2018 at 11:00. Patient states she needed a morning appointment due to transportation.

## 2018-01-07 NOTE — Telephone Encounter (Signed)
Patient needs refills on Adderall 30 mg. And Oxycodone-Acet 7.5-325 mg. [facility] [phone] [facility]

## 2018-01-13 NOTE — Telephone Encounter (Signed)
Scheduled AWV for 02/18/18 @ 840 AM. -MM

## 2018-01-14 NOTE — Progress Notes (Signed)
Patient: Kathleen Buchanan Female    DOB: 1965/07/10   52 y.o.   MRN: [id number] Visit Date: 01/16/2018  Today's Provider: Mar Daring, PA-C   Chief Complaint  Patient presents with  . Follow-up    ADHD and Pain Meds  . Sinusitis   Subjective:    HPI Patient here to follow-up on on her oxycodone-acetaminophen and ADHD which patient reports she is stable on this. Patient reports that she has not been taking the adderall for a week because of dizziness.  Patient reports she has been feeling dizzy for the past month she doesn't know if is coming from her sinuses. Reports that is mostly at in the morning. She reports that last night it was the worse. Associated symptoms: Sinus, HA, sinus pressure, popping in her ears, ears feel full. Denies chest pain, SOB.Treatments tried: None.      Allergies  Allergen Reactions  . Codeine Hives  . Erythromycin   . Hydrocodone-Acetaminophen Hives  . Lansoprazole   . Levofloxacin   . Migraine Formula  [Aspirin-Acetaminophen-Caffeine]   . Morphine Sulfate   . Omeprazole   . Pepcid  [Famotidine]   . Sulfa Antibiotics      Current Outpatient Medications:  .  albuterol (PROVENTIL HFA;VENTOLIN HFA) 108 (90 Base) MCG/ACT inhaler, Inhale 2 puffs into the lungs every 4 (four) hours as needed for wheezing or shortness of breath., Disp: 1 Inhaler, Rfl: 5 .  ALPRAZolam (XANAX) 1 MG tablet, Take 1 tablet (1 mg total) by mouth 2 (two) times daily., Disp: 60 tablet, Rfl: 5 .  amphetamine-dextroamphetamine (ADDERALL) 30 MG tablet, Take 1 tablet by mouth 2 (two) times daily., Disp: 14 tablet, Rfl: 0 .  ascorbic acid (VITAMIN C) 500 MG tablet, Take 1,000 mg by mouth daily. , Disp: , Rfl:  .  buPROPion (WELLBUTRIN SR) 150 MG 12 hr tablet, TAKE 1 TABLET BY MOUTH TWICE A DAY, Disp: 180 tablet, Rfl: 1 .  hydrochlorothiazide (HYDRODIURIL) 12.5 MG tablet, TAKE 1 TABLET BY MOUTH DAILY., Disp: 90 tablet, Rfl: 0 .  ibuprofen (ADVIL,MOTRIN) 200 MG  tablet, Take 4 tablets by mouth as needed., Disp: , Rfl:  .  oxyCODONE-acetaminophen (ENDOCET) 7.5-325 MG tablet, Take 1 tablet by mouth 2 (two) times daily., Disp: 14 tablet, Rfl: 0 .  pantoprazole (PROTONIX) 40 MG tablet, TAKE 1 TABLET BY MOUTH EVERY DAY, Disp: 90 tablet, Rfl: 1 .  polyethylene glycol powder (GLYCOLAX/MIRALAX) powder, Take 17 g by mouth 2 (two) times daily as needed., Disp: 3350 g, Rfl: 1 .  SYNTHROID 25 MCG tablet, TAKE 1 TABLET BY MOUTH EVERY DAY IN THE MORNING BEFORE BREAKFAST (Patient not taking: Reported on 01/16/2018), Disp: 90 tablet, Rfl: 1  Review of Systems  Constitutional: Positive for fatigue. Negative for chills and fever.  HENT: Positive for congestion, postnasal drip, rhinorrhea, sinus pressure and sinus pain. Negative for ear discharge, ear pain, hearing loss and tinnitus.   Eyes: Negative for visual disturbance.  Respiratory: Negative for chest tightness and shortness of breath.   Cardiovascular: Negative for chest pain, palpitations and leg swelling.  Gastrointestinal: Negative.   Neurological: Positive for dizziness and headaches. Negative for light-headedness.    Social History   Tobacco Use  . Smoking status: Former Smoker    Packs/day: 0.25    Types: Cigarettes    Last attempt to quit: 10/09/2016    Years since quitting: 1.2  . Smokeless tobacco: Never Used  . Tobacco comment: 2 cigarettes a day  Substance Use Topics  . Alcohol use: No    Alcohol/week: 0.0 standard drinks   Objective:   BP (!) 96/59 (BP Location: Left Arm, Patient Position: Sitting, Cuff Size: Large)   Pulse (!) 55   Temp 98.2 F (36.8 C) (Oral)   Resp 16   SpO2 97%  Vitals:   01/16/18 1117  BP: (!) 96/59  Pulse: (!) 55  Resp: 16  Temp: 98.2 F (36.8 C)  TempSrc: Oral  SpO2: 97%     Physical Exam  Constitutional: She appears well-developed and well-nourished. No distress.  HENT:  Head: Normocephalic and atraumatic.  Right Ear: Hearing, tympanic membrane,  external ear and ear canal normal.  Left Ear: Hearing, tympanic membrane, external ear and ear canal normal.  Nose: Right sinus exhibits maxillary sinus tenderness and frontal sinus tenderness. Left sinus exhibits maxillary sinus tenderness and frontal sinus tenderness.  Mouth/Throat: Uvula is midline, oropharynx is clear and moist and mucous membranes are normal. No oropharyngeal exudate.  Eyes: Pupils are equal, round, and reactive to light. Conjunctivae are normal. Right eye exhibits normal extraocular motion and no nystagmus. Left eye exhibits nystagmus. Left eye exhibits normal extraocular motion.  Neck: Normal range of motion. Neck supple. No tracheal deviation present. No thyromegaly present.  Cardiovascular: Normal rate, regular rhythm and normal heart sounds. Exam reveals no gallop and no friction rub.  No murmur heard. Pulmonary/Chest: Effort normal and breath sounds normal. No stridor. No respiratory distress. She has no wheezes. She has no rales.  Lymphadenopathy:    She has no cervical adenopathy.  Skin: She is not diaphoretic.  Vitals reviewed.       Assessment & Plan:     1. Chronic narcotic use Due for urine drug screen for monitoring of controlled substances. This was ordered as below.  - ToxASSURE Select 13 (MW), Urine  2. Other idiopathic scoliosis, unspecified spinal region Stable. Diagnosis pulled for medication refill. Continue current medical treatment plan. - oxyCODONE-acetaminophen (ENDOCET) 7.5-325 MG tablet; Take 1 tablet by mouth 2 (two) times daily.  Dispense: 60 tablet; Refill: 0  3. Tendinitis of wrist Stable with oxycodone-acetaminophen 7.5-325mg  1 tab PO BID prn for severe pain.   4. Attention deficit hyperactivity disorder (ADHD), predominantly inattentive type Stable. Diagnosis pulled for medication refill. Continue current medical treatment plan. - ToxASSURE Select 13 (MW), Urine - amphetamine-dextroamphetamine (ADDERALL) 30 MG tablet; Take 1  tablet by mouth 2 (two) times daily.  Dispense: 60 tablet; Refill: 0  5. Episodic paroxysmal anxiety disorder Stable on alprazolam 1mg . Will check labs as below and f/u pending results. - ToxASSURE Select 13 (MW), Urine  6. Dizziness Worsening. Suspect BPPV secondary to sinusitis, ETD. Advised patient to use Sudafed. Patient reports intolerant to intranasal steroids as they cause rebound headaches. Will give meclizine as below. Will get imaging since patient is also having a headache and dizziness together over the last month. Neuro exam grossly intact. ENT referral made for chronic sinusitis and BPPV. I will also check labs to r/o organic cause.  - meclizine (ANTIVERT) 25 MG tablet; Take 1 tablet (25 mg total) by mouth 3 (three) times daily as needed for dizziness.  Dispense: 30 tablet; Refill: 0 - Ambulatory referral to ENT - CBC w/Diff/Platelet - Comprehensive Metabolic Panel (CMET)  7. Non-seasonal allergic rhinitis due to pollen See above medical treatment plan. - Ambulatory referral to ENT  8. New daily persistent headache See above medical treatment plan. - CBC w/Diff/Platelet - Comprehensive Metabolic Panel (CMET) - MR BRAIN  WO CONTRAST; Future  9. Other peripheral vertigo, left ear See above medical treatment plan. - MR BRAIN WO CONTRAST; Future  10. Hypercholesteremia Will check labs as below and f/u pending results. - Comprehensive Metabolic Panel (CMET) - Lipid Profile  11. Hypothyroidism, unspecified type Will check labs as below and f/u pending results. Patient has not been taking Synthroid.  - TSH  12. Acute pansinusitis, recurrence not specified Worsening symptoms that have not responded to OTC medications. Will give augmentin as below. Continue allergy medications. Stay well hydrated and get plenty of rest. Call if no symptom improvement or if symptoms worsen. - amoxicillin-clavulanate (AUGMENTIN) 875-125 MG tablet; Take 1 tablet by mouth 2 (two) times daily.   Dispense: 20 tablet; Refill: 0  I spent approximately 50 minutes with the patient today. Over 50% of this time was spent with counseling and educating the patient.       Mar Daring, PA-C  [facility]

## 2018-01-16 ENCOUNTER — Ambulatory Visit (INDEPENDENT_AMBULATORY_CARE_PROVIDER_SITE_OTHER): Payer: Medicare Other | Admitting: Physician Assistant

## 2018-01-16 ENCOUNTER — Encounter: Payer: Self-pay | Admitting: Physician Assistant

## 2018-01-16 VITALS — BP 96/59 | HR 55 | Temp 98.2°F | Resp 16

## 2018-01-16 DIAGNOSIS — F1190 Opioid use, unspecified, uncomplicated: Secondary | ICD-10-CM | POA: Diagnosis not present

## 2018-01-16 DIAGNOSIS — E039 Hypothyroidism, unspecified: Secondary | ICD-10-CM | POA: Diagnosis not present

## 2018-01-16 DIAGNOSIS — F900 Attention-deficit hyperactivity disorder, predominantly inattentive type: Secondary | ICD-10-CM

## 2018-01-16 DIAGNOSIS — E7800 Pure hypercholesterolemia, unspecified: Secondary | ICD-10-CM

## 2018-01-16 DIAGNOSIS — M4120 Other idiopathic scoliosis, site unspecified: Secondary | ICD-10-CM

## 2018-01-16 DIAGNOSIS — G4452 New daily persistent headache (NDPH): Secondary | ICD-10-CM | POA: Diagnosis not present

## 2018-01-16 DIAGNOSIS — R42 Dizziness and giddiness: Secondary | ICD-10-CM

## 2018-01-16 DIAGNOSIS — M778 Other enthesopathies, not elsewhere classified: Secondary | ICD-10-CM | POA: Diagnosis not present

## 2018-01-16 DIAGNOSIS — J301 Allergic rhinitis due to pollen: Secondary | ICD-10-CM

## 2018-01-16 DIAGNOSIS — F410 Panic disorder [episodic paroxysmal anxiety] without agoraphobia: Secondary | ICD-10-CM

## 2018-01-16 DIAGNOSIS — J0140 Acute pansinusitis, unspecified: Secondary | ICD-10-CM

## 2018-01-16 DIAGNOSIS — H81392 Other peripheral vertigo, left ear: Secondary | ICD-10-CM

## 2018-01-16 MED ORDER — AMOXICILLIN-POT CLAVULANATE 875-125 MG PO TABS: 1.0000 | ORAL_TABLET | Freq: Two times a day (BID) | ORAL | 0 refills | Status: DC

## 2018-01-16 MED ORDER — AMPHETAMINE-DEXTROAMPHETAMINE 30 MG PO TABS: 1.0000 | ORAL_TABLET | Freq: Two times a day (BID) | ORAL | 0 refills | Status: DC

## 2018-01-16 MED ORDER — MECLIZINE HCL 25 MG PO TABS: 25.0000 mg | ORAL_TABLET | Freq: Three times a day (TID) | ORAL | 0 refills | Status: DC | PRN

## 2018-01-16 MED ORDER — OXYCODONE-ACETAMINOPHEN 7.5-325 MG PO TABS: 1.0000 | ORAL_TABLET | Freq: Two times a day (BID) | ORAL | 0 refills | Status: DC

## 2018-01-17 LAB — CBC WITH DIFFERENTIAL/PLATELET
BASOS: 1 %
Basophils Absolute: 0.1 10*3/uL (ref 0.0–0.2)
EOS (ABSOLUTE): 0.2 10*3/uL (ref 0.0–0.4)
EOS: 3 %
HEMOGLOBIN: 14.6 g/dL (ref 11.1–15.9)
Hematocrit: 43.2 % (ref 34.0–46.6)
IMMATURE GRANS (ABS): 0 10*3/uL (ref 0.0–0.1)
Immature Granulocytes: 0 %
LYMPHS: 33 %
Lymphocytes Absolute: 1.8 10*3/uL (ref 0.7–3.1)
MCH: 31.7 pg (ref 26.6–33.0)
MCHC: 33.8 g/dL (ref 31.5–35.7)
MCV: 94 fL (ref 79–97)
MONOCYTES: 6 %
Monocytes Absolute: 0.3 10*3/uL (ref 0.1–0.9)
NEUTROS ABS: 3.3 10*3/uL (ref 1.4–7.0)
NEUTROS PCT: 57 %
PLATELETS: 174 10*3/uL (ref 150–450)
RBC: 4.61 x10E6/uL (ref 3.77–5.28)
RDW: 13.6 % (ref 12.3–15.4)
WBC: 5.6 10*3/uL (ref 3.4–10.8)

## 2018-01-17 LAB — COMPREHENSIVE METABOLIC PANEL
A/G RATIO: 1.8 (ref 1.2–2.2)
ALT: 19 IU/L (ref 0–32)
AST: 21 IU/L (ref 0–40)
Albumin: 4.4 g/dL (ref 3.5–5.5)
Alkaline Phosphatase: 99 IU/L (ref 39–117)
BUN/Creatinine Ratio: 21 (ref 9–23)
BUN: 17 mg/dL (ref 6–24)
Bilirubin Total: 0.3 mg/dL (ref 0.0–1.2)
CO2: 25 mmol/L (ref 20–29)
Calcium: 9.2 mg/dL (ref 8.7–10.2)
Chloride: 101 mmol/L (ref 96–106)
Creatinine, Ser: 0.82 mg/dL (ref 0.57–1.00)
GFR, EST AFRICAN AMERICAN: 95 mL/min/{1.73_m2} (ref >59)
GFR, EST NON AFRICAN AMERICAN: 83 mL/min/{1.73_m2} (ref >59)
Globulin, Total: 2.4 g/dL (ref 1.5–4.5)
Glucose: 76 mg/dL (ref 65–99)
POTASSIUM: 4 mmol/L (ref 3.5–5.2)
Sodium: 139 mmol/L (ref 134–144)
TOTAL PROTEIN: 6.8 g/dL (ref 6.0–8.5)

## 2018-01-17 LAB — LIPID PANEL
CHOL/HDL RATIO: 6.9 ratio — AB (ref 0.0–4.4)
Cholesterol, Total: 234 mg/dL — ABNORMAL HIGH (ref 100–199)
HDL: 34 mg/dL — AB (ref >39)
LDL CALC: 162 mg/dL — AB (ref 0–99)
TRIGLYCERIDES: 191 mg/dL — AB (ref 0–149)
VLDL Cholesterol Cal: 38 mg/dL (ref 5–40)

## 2018-01-17 LAB — TSH: TSH: 7.23 u[IU]/mL — ABNORMAL HIGH (ref 0.450–4.500)

## 2018-01-19 ENCOUNTER — Telehealth: Payer: Self-pay

## 2018-01-19 NOTE — Telephone Encounter (Signed)
-----   Message from Mar Daring, PA-C sent at 01/19/2018  8:00 AM EST ----- Thyroid is underactive. Would recommend to restart Synthroid. All other labs are normal and/or stable.

## 2018-01-19 NOTE — Telephone Encounter (Signed)
Patient was advised.  

## 2018-01-19 NOTE — Telephone Encounter (Signed)
LVMTRC 

## 2018-01-19 NOTE — Telephone Encounter (Signed)
Patient returned call to [facility].

## 2018-01-22 LAB — TOXASSURE SELECT 13 (MW), URINE

## 2018-02-13 ENCOUNTER — Telehealth: Payer: Self-pay | Admitting: Physician Assistant

## 2018-02-13 DIAGNOSIS — T3695XA Adverse effect of unspecified systemic antibiotic, initial encounter: Principal | ICD-10-CM

## 2018-02-13 DIAGNOSIS — F900 Attention-deficit hyperactivity disorder, predominantly inattentive type: Secondary | ICD-10-CM

## 2018-02-13 DIAGNOSIS — M778 Other enthesopathies, not elsewhere classified: Secondary | ICD-10-CM

## 2018-02-13 DIAGNOSIS — B379 Candidiasis, unspecified: Secondary | ICD-10-CM

## 2018-02-13 MED ORDER — AMPHETAMINE-DEXTROAMPHETAMINE 30 MG PO TABS: 1.0000 | ORAL_TABLET | Freq: Two times a day (BID) | ORAL | 0 refills | Status: DC

## 2018-02-13 MED ORDER — OXYCODONE-ACETAMINOPHEN 7.5-325 MG PO TABS: 1.0000 | ORAL_TABLET | Freq: Two times a day (BID) | ORAL | 0 refills | Status: DC

## 2018-02-13 MED ORDER — FLUCONAZOLE 150 MG PO TABS: 150.0000 mg | ORAL_TABLET | Freq: Once | ORAL | 0 refills | Status: AC

## 2018-02-13 NOTE — Telephone Encounter (Signed)
Diflucan sent in

## 2018-02-13 NOTE — Telephone Encounter (Signed)
Patient needs refill on Adderall 30 mg. And Oxycodone 7.5-325 mg. [facility] [place]

## 2018-02-13 NOTE — Telephone Encounter (Signed)
Pt now requesting something to be called in for a yeast infection.  Please advise.  Thanks, [employer]

## 2018-02-13 NOTE — Telephone Encounter (Signed)
Patient was last seen in office to address chronic issues on 01/16/18, prescriptions were filled as well that day for 30days. Please review chart. KW

## 2018-02-13 NOTE — Addendum Note (Signed)
Addended by: Mar Daring on: 02/13/2018 02:14 PM   Modules accepted: Orders

## 2018-02-13 NOTE — Telephone Encounter (Signed)
[place] reviewed. refilled

## 2018-02-18 ENCOUNTER — Ambulatory Visit: Payer: Medicare Other

## 2018-02-18 ENCOUNTER — Ambulatory Visit
Admission: RE | Admit: 2018-02-18 | Discharge: 2018-02-18 | Disposition: A | Discharge status: Home / Self Care | Payer: Medicare Other | Source: Ambulatory Visit | Attending: Physician Assistant | Admitting: Physician Assistant

## 2018-02-18 DIAGNOSIS — G4452 New daily persistent headache (NDPH): Secondary | ICD-10-CM | POA: Insufficient documentation

## 2018-02-18 DIAGNOSIS — J310 Chronic rhinitis: Secondary | ICD-10-CM | POA: Diagnosis not present

## 2018-02-18 DIAGNOSIS — H81392 Other peripheral vertigo, left ear: Secondary | ICD-10-CM | POA: Diagnosis not present

## 2018-02-18 DIAGNOSIS — R42 Dizziness and giddiness: Secondary | ICD-10-CM | POA: Diagnosis not present

## 2018-02-20 ENCOUNTER — Telehealth: Payer: Self-pay

## 2018-02-20 NOTE — Telephone Encounter (Signed)
-----   Message from Mar Daring, [place] sent at 02/19/2018 11:49 AM EST ----- Brain MRI normal

## 2018-02-20 NOTE — Telephone Encounter (Signed)
Pt advised.   Thanks,   -Laura  

## 2018-03-12 ENCOUNTER — Telehealth: Payer: Self-pay | Admitting: Physician Assistant

## 2018-03-12 DIAGNOSIS — F419 Anxiety disorder, unspecified: Secondary | ICD-10-CM

## 2018-03-12 DIAGNOSIS — F900 Attention-deficit hyperactivity disorder, predominantly inattentive type: Secondary | ICD-10-CM

## 2018-03-12 DIAGNOSIS — M778 Other enthesopathies, not elsewhere classified: Secondary | ICD-10-CM

## 2018-03-12 NOTE — Telephone Encounter (Signed)
Pt needing refills on:  amphetamine-dextroamphetamine (ADDERALL) 30 MG tablet oxyCODONE-acetaminophen (ENDOCET) 7.5-325 MG tablet ALPRAZolam (XANAX) 1 MG tablet  [id number] [place], [place] [address] (Phone) [phone] (Fax)   Thanks, [place]

## 2018-03-16 MED ORDER — OXYCODONE-ACETAMINOPHEN 7.5-325 MG PO TABS: 1.0000 | ORAL_TABLET | Freq: Two times a day (BID) | ORAL | 0 refills | Status: DC

## 2018-03-16 MED ORDER — AMPHETAMINE-DEXTROAMPHETAMINE 30 MG PO TABS: 1.0000 | ORAL_TABLET | Freq: Two times a day (BID) | ORAL | 0 refills | Status: DC

## 2018-03-16 MED ORDER — ALPRAZOLAM 1 MG PO TABS: 1.0000 mg | ORAL_TABLET | Freq: Two times a day (BID) | ORAL | 5 refills | Status: DC

## 2018-03-16 NOTE — Telephone Encounter (Signed)
[place] reviewed.  Rx sent in to [place]

## 2018-03-16 NOTE — Telephone Encounter (Signed)
Pt needing this Rx filled asap.  Thanks, [employer]

## 2018-03-19 ENCOUNTER — Ambulatory Visit: Payer: Medicare Other

## 2018-04-10 ENCOUNTER — Telehealth: Payer: Self-pay | Admitting: Physician Assistant

## 2018-04-10 ENCOUNTER — Other Ambulatory Visit: Payer: Self-pay

## 2018-04-10 DIAGNOSIS — F900 Attention-deficit hyperactivity disorder, predominantly inattentive type: Secondary | ICD-10-CM

## 2018-04-10 DIAGNOSIS — M778 Other enthesopathies, not elsewhere classified: Secondary | ICD-10-CM

## 2018-04-10 MED ORDER — OXYCODONE-ACETAMINOPHEN 7.5-325 MG PO TABS: 1.0000 | ORAL_TABLET | Freq: Two times a day (BID) | ORAL | 0 refills | Status: DC

## 2018-04-10 MED ORDER — AMPHETAMINE-DEXTROAMPHETAMINE 30 MG PO TABS: 1.0000 | ORAL_TABLET | Freq: Two times a day (BID) | ORAL | 0 refills | Status: DC

## 2018-04-27 DIAGNOSIS — J019 Acute sinusitis, unspecified: Secondary | ICD-10-CM | POA: Diagnosis not present

## 2018-04-27 DIAGNOSIS — J029 Acute pharyngitis, unspecified: Secondary | ICD-10-CM | POA: Diagnosis not present

## 2018-04-27 DIAGNOSIS — B9689 Other specified bacterial agents as the cause of diseases classified elsewhere: Secondary | ICD-10-CM | POA: Diagnosis not present

## 2018-05-06 ENCOUNTER — Ambulatory Visit: Payer: Medicare Other

## 2018-05-11 ENCOUNTER — Other Ambulatory Visit: Payer: Self-pay | Admitting: Physician Assistant

## 2018-05-11 DIAGNOSIS — F900 Attention-deficit hyperactivity disorder, predominantly inattentive type: Secondary | ICD-10-CM

## 2018-05-11 DIAGNOSIS — M778 Other enthesopathies, not elsewhere classified: Secondary | ICD-10-CM

## 2018-05-11 MED ORDER — AMPHETAMINE-DEXTROAMPHETAMINE 30 MG PO TABS: 1.0000 | ORAL_TABLET | Freq: Two times a day (BID) | ORAL | 0 refills | Status: DC

## 2018-05-11 MED ORDER — OXYCODONE-ACETAMINOPHEN 7.5-325 MG PO TABS: 1.0000 | ORAL_TABLET | Freq: Two times a day (BID) | ORAL | 0 refills | Status: DC

## 2018-05-11 NOTE — Telephone Encounter (Signed)
Pt needs refills on   Adderall 30 mg  Oxycodone 7.5-325  [facility] [place]  CB#  [phone]  Thanks teri

## 2018-05-12 ENCOUNTER — Telehealth: Payer: Self-pay

## 2018-05-12 NOTE — Telephone Encounter (Signed)
Called pt to r/s previously cancelled AWV. Pt states she would like to CB tomorrow to do this. Advised her to ask for [facility] when she CB.  -MM

## 2018-05-20 NOTE — Telephone Encounter (Signed)
Spoke with pt who states she going to have to call back next month to schedule her AWV. Offered her a chance to go ahead an get apt for 4/20 and she declined. Pt wants to CB when she is available. Closing TE. -MM

## 2018-06-01 ENCOUNTER — Telehealth: Payer: Self-pay | Admitting: Physician Assistant

## 2018-06-01 DIAGNOSIS — M62838 Other muscle spasm: Secondary | ICD-10-CM

## 2018-06-01 MED ORDER — BACLOFEN 10 MG PO TABS: 10.0000 mg | ORAL_TABLET | Freq: Three times a day (TID) | ORAL | 0 refills | Status: DC

## 2018-06-01 NOTE — Telephone Encounter (Signed)
Patient advised as below.  

## 2018-06-01 NOTE — Telephone Encounter (Signed)
Sent in Baclofen for her

## 2018-06-01 NOTE — Telephone Encounter (Signed)
Pulled a muscle in her back and would like Sonia Baller to call in a muscle relaxer.  [place]  CB#  [phone]

## 2018-06-10 ENCOUNTER — Telehealth: Payer: Self-pay

## 2018-06-10 DIAGNOSIS — M778 Other enthesopathies, not elsewhere classified: Secondary | ICD-10-CM

## 2018-06-10 DIAGNOSIS — F900 Attention-deficit hyperactivity disorder, predominantly inattentive type: Secondary | ICD-10-CM

## 2018-06-10 DIAGNOSIS — J4520 Mild intermittent asthma, uncomplicated: Secondary | ICD-10-CM

## 2018-06-10 MED ORDER — OXYCODONE-ACETAMINOPHEN 7.5-325 MG PO TABS: 1.0000 | ORAL_TABLET | Freq: Two times a day (BID) | ORAL | 0 refills | Status: DC

## 2018-06-10 MED ORDER — ALBUTEROL SULFATE HFA 108 (90 BASE) MCG/ACT IN AERS: 2.0000 | INHALATION_SPRAY | RESPIRATORY_TRACT | 5 refills | Status: DC | PRN

## 2018-06-10 MED ORDER — AMPHETAMINE-DEXTROAMPHETAMINE 30 MG PO TABS: 1.0000 | ORAL_TABLET | Freq: Two times a day (BID) | ORAL | 0 refills | Status: DC

## 2018-06-10 NOTE — Telephone Encounter (Signed)
Patient requesting refills for the following medication albuterol (PROVENTIL HFA;VENTOLIN HFA) 108 (90 Base) MCG/ACT inhaler  amphetamine-dextroamphetamine (ADDERALL) 30 MG tablet  oxyCODONE-acetaminophen (ENDOCET) 7.5-325 MG tablet

## 2018-06-10 NOTE — Telephone Encounter (Signed)
Patient was advised.  

## 2018-06-10 NOTE — Telephone Encounter (Signed)
Refilled for her 

## 2018-06-11 ENCOUNTER — Ambulatory Visit: Payer: Medicare Other | Admitting: Physician Assistant

## 2018-06-12 ENCOUNTER — Other Ambulatory Visit: Payer: Self-pay

## 2018-06-12 ENCOUNTER — Encounter: Payer: Self-pay | Admitting: Physician Assistant

## 2018-06-12 ENCOUNTER — Ambulatory Visit (INDEPENDENT_AMBULATORY_CARE_PROVIDER_SITE_OTHER): Payer: Medicare Other | Admitting: Physician Assistant

## 2018-06-12 ENCOUNTER — Telehealth: Payer: Self-pay

## 2018-06-12 DIAGNOSIS — F419 Anxiety disorder, unspecified: Secondary | ICD-10-CM

## 2018-06-12 DIAGNOSIS — K5903 Drug induced constipation: Secondary | ICD-10-CM

## 2018-06-12 DIAGNOSIS — R059 Cough, unspecified: Secondary | ICD-10-CM

## 2018-06-12 DIAGNOSIS — T3695XA Adverse effect of unspecified systemic antibiotic, initial encounter: Secondary | ICD-10-CM

## 2018-06-12 DIAGNOSIS — R05 Cough: Secondary | ICD-10-CM

## 2018-06-12 DIAGNOSIS — J0140 Acute pansinusitis, unspecified: Secondary | ICD-10-CM

## 2018-06-12 DIAGNOSIS — B379 Candidiasis, unspecified: Secondary | ICD-10-CM | POA: Diagnosis not present

## 2018-06-12 DIAGNOSIS — T402X5A Adverse effect of other opioids, initial encounter: Secondary | ICD-10-CM | POA: Diagnosis not present

## 2018-06-12 MED ORDER — AMOXICILLIN-POT CLAVULANATE 875-125 MG PO TABS: 1.0000 | ORAL_TABLET | Freq: Two times a day (BID) | ORAL | 0 refills | Status: DC

## 2018-06-12 MED ORDER — PSEUDOEPH-BROMPHEN-DM 30-2-10 MG/5ML PO SYRP: 5.0000 mL | ORAL_SOLUTION | Freq: Four times a day (QID) | ORAL | 0 refills | Status: DC | PRN

## 2018-06-12 MED ORDER — FLUCONAZOLE 150 MG PO TABS: 150.0000 mg | ORAL_TABLET | Freq: Once | ORAL | 0 refills | Status: AC

## 2018-06-12 MED ORDER — POLYETHYLENE GLYCOL 3350 17 GM/SCOOP PO POWD: 17.0000 g | Freq: Two times a day (BID) | ORAL | 1 refills | Status: DC | PRN

## 2018-06-12 NOTE — Progress Notes (Signed)
Virtual Visit via Video Note  I connected with Kathleen Buchanan on 06/12/18 at 10:20 AM EDT by a video enabled telemedicine application and verified that I am speaking with the correct person using two identifiers.   I discussed the limitations of evaluation and management by telemedicine and the availability of in person appointments. The patient expressed understanding and agreed to proceed.  Mar Daring, PA-C    Patient: Kathleen Buchanan Female    DOB: [birth year]   53 y.o.   MRN: [id number] Visit Date: 06/12/2018  Today's Provider: Mar Daring, PA-C   Chief Complaint  Patient presents with  . Sinusitis  . Sore Throat  . Cough  . Headache   Subjective:     HPI  Patient states that she has been having symptoms of sinus pain, sinus pressure, fluid in ears, runny nose, sneezing, sore throat, cough, nasal and chest congestion that is green/yellow.Symptoms have been ongoing for 3 days. She is currently taking Allegra. She also needs a refill on her Miralax.  Allergies  Allergen Reactions  . Codeine Hives  . Erythromycin   . Hydrocodone-Acetaminophen Hives  . Lansoprazole   . Levofloxacin   . Migraine Formula  [Aspirin-Acetaminophen-Caffeine]   . Morphine Sulfate   . Omeprazole   . Pepcid  [Famotidine]   . Sulfa Antibiotics      Current Outpatient Medications:  .  albuterol (PROVENTIL HFA;VENTOLIN HFA) 108 (90 Base) MCG/ACT inhaler, Inhale 2 puffs into the lungs every 4 (four) hours as needed for wheezing or shortness of breath., Disp: 1 Inhaler, Rfl: 5 .  ALPRAZolam (XANAX) 1 MG tablet, Take 1 tablet (1 mg total) by mouth 2 (two) times daily., Disp: 60 tablet, Rfl: 5 .  amphetamine-dextroamphetamine (ADDERALL) 30 MG tablet, Take 1 tablet by mouth 2 (two) times daily., Disp: 60 tablet, Rfl: 0 .  ascorbic acid (VITAMIN C) 500 MG tablet, Take 1,000 mg by mouth daily. , Disp: , Rfl:  .  baclofen (LIORESAL) 10 MG tablet, Take 1 tablet (10 mg total) by mouth  3 (three) times daily., Disp: 30 each, Rfl: 0 .  hydrochlorothiazide (HYDRODIURIL) 12.5 MG tablet, TAKE 1 TABLET BY MOUTH DAILY., Disp: 90 tablet, Rfl: 0 .  ibuprofen (ADVIL,MOTRIN) 200 MG tablet, Take 4 tablets by mouth as needed., Disp: , Rfl:  .  oxyCODONE-acetaminophen (ENDOCET) 7.5-325 MG tablet, Take 1 tablet by mouth 2 (two) times daily., Disp: 60 tablet, Rfl: 0 .  pantoprazole (PROTONIX) 40 MG tablet, TAKE 1 TABLET BY MOUTH EVERY DAY, Disp: 90 tablet, Rfl: 1 .  polyethylene glycol powder (GLYCOLAX/MIRALAX) powder, Take 17 g by mouth 2 (two) times daily as needed., Disp: 3350 g, Rfl: 1 .  amoxicillin-clavulanate (AUGMENTIN) 875-125 MG tablet, Take 1 tablet by mouth 2 (two) times daily. (Patient not taking: Reported on 06/12/2018), Disp: 20 tablet, Rfl: 0 .  buPROPion (WELLBUTRIN SR) 150 MG 12 hr tablet, TAKE 1 TABLET BY MOUTH TWICE A DAY (Patient not taking: Reported on 06/12/2018), Disp: 180 tablet, Rfl: 1 .  meclizine (ANTIVERT) 25 MG tablet, Take 1 tablet (25 mg total) by mouth 3 (three) times daily as needed for dizziness. (Patient not taking: Reported on 06/12/2018), Disp: 30 tablet, Rfl: 0 .  SYNTHROID 25 MCG tablet, TAKE 1 TABLET BY MOUTH EVERY DAY IN THE MORNING BEFORE BREAKFAST (Patient not taking: Reported on 01/16/2018), Disp: 90 tablet, Rfl: 1  Review of Systems  Constitutional: Negative.   HENT: Positive for congestion, ear pain, postnasal drip,  sinus pressure, sinus pain, sneezing and sore throat.   Eyes: Negative.   Respiratory: Positive for cough. Negative for chest tightness, shortness of breath and wheezing.   Cardiovascular: Negative.   Gastrointestinal: Negative.   Endocrine: Negative.   Genitourinary: Negative.   Musculoskeletal: Negative.   Skin: Negative.   Allergic/Immunologic: Negative.   Neurological: Positive for headaches.  Hematological: Negative.   Psychiatric/Behavioral: Negative.     Social History   Tobacco Use  . Smoking status: Former Smoker     Packs/day: 0.25    Types: Cigarettes    Last attempt to quit: 10/09/2016    Years since quitting: 1.6  . Smokeless tobacco: Never Used  . Tobacco comment: 2 cigarettes a day  Substance Use Topics  . Alcohol use: No    Alcohol/week: 0.0 standard drinks      Objective:   There were no vitals taken for this visit. There were no vitals filed for this visit.   Physical Exam Vitals signs reviewed.  Constitutional:      General: She is not in acute distress.    Appearance: She is well-developed. She is ill-appearing.  HENT:     Head: Normocephalic and atraumatic.  Neck:     Musculoskeletal: Normal range of motion and neck supple.  Pulmonary:     Effort: Pulmonary effort is normal. No respiratory distress.  Neurological:     Mental Status: She is alert.  Psychiatric:        Mood and Affect: Mood normal.        Behavior: Behavior normal.        Thought Content: Thought content normal.        Judgment: Judgment normal.         Assessment & Plan    1. Acute pansinusitis, recurrence not specified Worsening symptoms that have not responded to OTC medications. Will give augmentin as below. Continue allergy medications. Stay well hydrated and get plenty of rest. Call if no symptom improvement or if symptoms worsen. - amoxicillin-clavulanate (AUGMENTIN) 875-125 MG tablet; Take 1 tablet by mouth 2 (two) times daily.  Dispense: 20 tablet; Refill: 0  2. Therapeutic opioid induced constipation Stable. Diagnosis pulled for medication refill. Continue current medical treatment plan. - polyethylene glycol powder (GLYCOLAX/MIRALAX) powder; Take 17 g by mouth 2 (two) times daily as needed.  Dispense: 3350 g; Refill: 1  3. Antibiotic-induced yeast infection Gets yeast infections with antibiotic use. Diflucan given as below.  - fluconazole (DIFLUCAN) 150 MG tablet; Take 1 tablet (150 mg total) by mouth once for 1 dose. May repeat in 48-72 hrs prn  Dispense: 2 tablet; Refill: 0  4. Cough  Bromfed DM for cough.  - brompheniramine-pseudoephedrine-DM 30-2-10 MG/5ML syrup; Take 5 mLs by mouth 4 (four) times daily as needed.  Dispense: 120 mL; Refill: 0    I discussed the assessment and treatment plan with the patient. The patient was provided an opportunity to ask questions and all were answered. The patient agreed with the plan and demonstrated an understanding of the instructions.   The patient was advised to call back or seek an in-person evaluation if the symptoms worsen or if the condition fails to improve as anticipated.  I provided 20 minutes of non-face-to-face time during this encounter.   Mar Daring, PA-C  [facility]

## 2018-06-12 NOTE — Telephone Encounter (Signed)
That is incorrect. This was sent on 03/16/18 to [facility] with 5 refills. Most recent fill was given on 05/12/18 from [place] and 3 refills remain on file there.

## 2018-06-12 NOTE — Telephone Encounter (Signed)
Pt states the first RX went [facility] but it needs to go to [place].  Thanks,   -Mickel Baas

## 2018-06-18 ENCOUNTER — Other Ambulatory Visit: Payer: Self-pay | Admitting: Physician Assistant

## 2018-06-18 DIAGNOSIS — F419 Anxiety disorder, unspecified: Secondary | ICD-10-CM

## 2018-06-18 NOTE — Addendum Note (Signed)
Addended by: Doristine Devoid on: 06/18/2018 02:16 PM   Modules accepted: Orders

## 2018-06-18 NOTE — Telephone Encounter (Signed)
[facility] the [facility]:  ALPRAZolam (XANAX) 1 MG tablet    Please advise.

## 2018-06-18 NOTE — Telephone Encounter (Addendum)
Please review

## 2018-07-06 ENCOUNTER — Other Ambulatory Visit: Payer: Self-pay | Admitting: Physician Assistant

## 2018-07-06 DIAGNOSIS — F419 Anxiety disorder, unspecified: Secondary | ICD-10-CM

## 2018-07-09 NOTE — Telephone Encounter (Signed)
Erroneous encounter

## 2018-07-13 ENCOUNTER — Other Ambulatory Visit: Payer: Self-pay | Admitting: Physician Assistant

## 2018-07-13 DIAGNOSIS — M778 Other enthesopathies, not elsewhere classified: Secondary | ICD-10-CM

## 2018-07-13 DIAGNOSIS — F900 Attention-deficit hyperactivity disorder, predominantly inattentive type: Secondary | ICD-10-CM

## 2018-07-13 MED ORDER — OXYCODONE-ACETAMINOPHEN 7.5-325 MG PO TABS: 1.0000 | ORAL_TABLET | Freq: Two times a day (BID) | ORAL | 0 refills | Status: DC

## 2018-07-13 MED ORDER — AMPHETAMINE-DEXTROAMPHETAMINE 30 MG PO TABS: 1.0000 | ORAL_TABLET | Freq: Two times a day (BID) | ORAL | 0 refills | Status: DC

## 2018-07-13 NOTE — Telephone Encounter (Signed)
pt needs refill on   Adderall 30 mg  Oxycodone 7.5-325  thanks  teri

## 2018-08-12 ENCOUNTER — Other Ambulatory Visit: Payer: Self-pay

## 2018-08-12 DIAGNOSIS — F900 Attention-deficit hyperactivity disorder, predominantly inattentive type: Secondary | ICD-10-CM

## 2018-08-12 DIAGNOSIS — M778 Other enthesopathies, not elsewhere classified: Secondary | ICD-10-CM

## 2018-08-12 MED ORDER — AMPHETAMINE-DEXTROAMPHETAMINE 30 MG PO TABS: 1.0000 | ORAL_TABLET | Freq: Two times a day (BID) | ORAL | 0 refills | Status: DC

## 2018-08-12 MED ORDER — OXYCODONE-ACETAMINOPHEN 7.5-325 MG PO TABS: 1.0000 | ORAL_TABLET | Freq: Two times a day (BID) | ORAL | 0 refills | Status: DC

## 2018-09-14 ENCOUNTER — Telehealth: Payer: Self-pay

## 2018-09-14 DIAGNOSIS — F900 Attention-deficit hyperactivity disorder, predominantly inattentive type: Secondary | ICD-10-CM

## 2018-09-14 DIAGNOSIS — M778 Other enthesopathies, not elsewhere classified: Secondary | ICD-10-CM

## 2018-09-14 NOTE — Telephone Encounter (Signed)
Patient requesting refills on the following medication: oxyCODONE-acetaminophen (ENDOCET) 7.5-325 MG tablet  amphetamine-dextroamphetamine (ADDERALL) 30 MG tablet   [id number] [place], [place] [address]

## 2018-09-15 MED ORDER — AMPHETAMINE-DEXTROAMPHETAMINE 30 MG PO TABS: 1.0000 | ORAL_TABLET | Freq: Two times a day (BID) | ORAL | 0 refills | Status: DC

## 2018-09-15 MED ORDER — OXYCODONE-ACETAMINOPHEN 7.5-325 MG PO TABS: 1.0000 | ORAL_TABLET | Freq: Two times a day (BID) | ORAL | 0 refills | Status: DC

## 2018-10-14 ENCOUNTER — Other Ambulatory Visit: Payer: Self-pay | Admitting: Physician Assistant

## 2018-10-14 DIAGNOSIS — M778 Other enthesopathies, not elsewhere classified: Secondary | ICD-10-CM

## 2018-10-14 DIAGNOSIS — F900 Attention-deficit hyperactivity disorder, predominantly inattentive type: Secondary | ICD-10-CM

## 2018-10-14 MED ORDER — AMPHETAMINE-DEXTROAMPHETAMINE 30 MG PO TABS: 1.0000 | ORAL_TABLET | Freq: Two times a day (BID) | ORAL | 0 refills | Status: DC

## 2018-10-14 MED ORDER — OXYCODONE-ACETAMINOPHEN 7.5-325 MG PO TABS: 1.0000 | ORAL_TABLET | Freq: Two times a day (BID) | ORAL | 0 refills | Status: DC

## 2018-10-14 NOTE — Telephone Encounter (Signed)
Pt needing refills on:  oxyCODONE-acetaminophen (ENDOCET) 7.5-325 MG tablet amphetamine-dextroamphetamine (ADDERALL) 30 MG tablet   Please fill at:  [id number] [place], [place] [address] (Phone) [phone] (Fax)    Thanks, [place]

## 2018-11-12 ENCOUNTER — Other Ambulatory Visit: Payer: Self-pay | Admitting: Physician Assistant

## 2018-11-12 DIAGNOSIS — M778 Other enthesopathies, not elsewhere classified: Secondary | ICD-10-CM

## 2018-11-12 DIAGNOSIS — F900 Attention-deficit hyperactivity disorder, predominantly inattentive type: Secondary | ICD-10-CM

## 2018-11-12 MED ORDER — OXYCODONE-ACETAMINOPHEN 7.5-325 MG PO TABS: 1.0000 | ORAL_TABLET | Freq: Two times a day (BID) | ORAL | 0 refills | Status: DC

## 2018-11-12 MED ORDER — AMPHETAMINE-DEXTROAMPHETAMINE 30 MG PO TABS: 1.0000 | ORAL_TABLET | Freq: Two times a day (BID) | ORAL | 0 refills | Status: DC

## 2018-11-12 NOTE — Telephone Encounter (Signed)
Pt needs a refill on  Adderall 30 mg  Oxycodone 7.5-325  [facility]

## 2018-11-12 NOTE — Telephone Encounter (Signed)
Sent in

## 2018-11-24 NOTE — Progress Notes (Signed)
Subjective:   Kathleen Buchanan is a 53 y.o. female who presents for Medicare Annual (Subsequent) preventive examination.    This visit is being conducted through telemedicine due to the COVID-19 pandemic. This patient has given me verbal consent via doximity to conduct this visit, patient states they are participating from their home address. Some vital signs may be absent or patient reported.    Patient identification: identified by name, DOB, and current address  Review of Systems:  N/A  Cardiac Risk Factors include: smoking/ tobacco exposure;obesity (BMI >30kg/m2)     Objective:     Vitals: There were no vitals taken for this visit.  There is no height or weight on file to calculate BMI. Unable to obtain vitals due to visit being conducted via telephonically.   Advanced Directives 11/25/2018 11/08/2016 07/25/2015 11/08/2014 09/15/2014  Does Patient Have a Medical Advance Directive? No No No No No  Would patient like information on creating a medical advance directive? No - Patient declined No - Patient declined No - patient declined information - No - patient declined information    Tobacco Social History   Tobacco Use  Smoking Status Current Every Day Smoker   Packs/day: 0.25   Types: Cigarettes  Smokeless Tobacco Never Used     Ready to quit: Yes Counseling given: No   Clinical Intake:  Pre-visit preparation completed: Yes  Pain : No/denies pain Pain Score: 0-No pain     Nutritional Risks: None Diabetes: No  How often do you need to have someone help you when you read instructions, pamphlets, or other written materials from your doctor or pharmacy?: 1 - Never  Interpreter Needed?: No  Information entered by :: [facility], LPN  Past Medical History:  Diagnosis Date   Asthma    COPD (chronic obstructive pulmonary disease) ([place])    Past Surgical History:  Procedure Laterality Date   ABLATION  2011   BREAST REDUCTION SURGERY Bilateral 08/1991    CESAREAN SECTION     X 2   REDUCTION MAMMAPLASTY Bilateral 1990?   TUBAL LIGATION  1999   Family History  Problem Relation Age of Onset   Lung cancer Father    Ulcers Father    Cirrhosis Father    Colon polyps Maternal Aunt    Ulcers Mother    Colon polyps Mother    Diabetes Mother    Hyperlipidemia Mother    Aneurysm Maternal Grandmother    Throat cancer Maternal Grandfather    Social History   Socioeconomic History   Marital status: Single    Spouse name: Not on file   Number of children: 2   Years of education: Not on file   Highest education level: 11th grade  Occupational History   Occupation: disability  Social Designer, fashion/clothing strain: Not hard at all   Food insecurity    Worry: Never true    Inability: Never true   Transportation needs    Medical: No    Non-medical: No  Tobacco Use   Smoking status: Current Every Day Smoker    Packs/day: 0.25    Types: Cigarettes   Smokeless tobacco: Never Used  Substance and Sexual Activity   Alcohol use: No    Alcohol/week: 0.0 standard drinks   Drug use: No   Sexual activity: Not on file  Lifestyle   Physical activity    Days per week: 0 days    Minutes per session: 0 min   Stress: Not at all  Relationships   Social connections    Talks on phone: Patient refused    Gets together: Patient refused    Attends religious service: Patient refused    Active member of club or organization: Patient refused    Attends meetings of clubs or organizations: Patient refused    Relationship status: Patient refused  Other Topics Concern   Not on file  Social History Narrative   Not on file    Outpatient Encounter Medications as of 11/25/2018  Medication Sig   albuterol (PROVENTIL HFA;VENTOLIN HFA) 108 (90 Base) MCG/ACT inhaler Inhale 2 puffs into the lungs every 4 (four) hours as needed for wheezing or shortness of breath.   ALPRAZolam (XANAX) 1 MG tablet TAKE 1 TABLET BY MOUTH  TWICE A DAY   amphetamine-dextroamphetamine (ADDERALL) 30 MG tablet Take 1 tablet by mouth 2 (two) times daily.   Aspirin-Caffeine (BC FAST PAIN RELIEF PO) Take by mouth as needed.   baclofen (LIORESAL) 10 MG tablet Take 1 tablet (10 mg total) by mouth 3 (three) times daily. (Patient taking differently: Take 10 mg by mouth 3 (three) times daily. As needed)   cetirizine (ZYRTEC) 10 MG tablet Take 10 mg by mouth daily.   hydrochlorothiazide (HYDRODIURIL) 12.5 MG tablet TAKE 1 TABLET BY MOUTH DAILY.   ibuprofen (ADVIL,MOTRIN) 200 MG tablet Take 4 tablets by mouth as needed.   oxyCODONE-acetaminophen (ENDOCET) 7.5-325 MG tablet Take 1 tablet by mouth 2 (two) times daily.   pantoprazole (PROTONIX) 40 MG tablet TAKE 1 TABLET BY MOUTH EVERY DAY   polyethylene glycol powder (GLYCOLAX/MIRALAX) powder Take 17 g by mouth 2 (two) times daily as needed.   amoxicillin-clavulanate (AUGMENTIN) 875-125 MG tablet Take 1 tablet by mouth 2 (two) times daily. (Patient not taking: Reported on 11/25/2018)   ascorbic acid (VITAMIN C) 500 MG tablet Take 1,000 mg by mouth daily.    brompheniramine-pseudoephedrine-DM 30-2-10 MG/5ML syrup Take 5 mLs by mouth 4 (four) times daily as needed. (Patient not taking: Reported on 11/25/2018)   No facility-administered encounter medications on file as of 11/25/2018.     Activities of Daily Living In your present state of health, do you have any difficulty performing the following activities: 11/25/2018  Hearing? Y  Comment Due to fluid build up in ears.  Vision? N  Difficulty concentrating or making decisions? N  Walking or climbing stairs? Y  Comment Due to back, hip and leg pains.  Dressing or bathing? N  Doing errands, shopping? N  Preparing Food and eating ? N  Using the Toilet? N  In the past six months, have you accidently leaked urine? N  Do you have problems with loss of bowel control? N  Managing your Medications? N  Managing your Finances? N    Housekeeping or managing your Housekeeping? N  Some recent data might be hidden    Patient Care Team: Rubye Beach as PCP - General (Family Medicine)    Assessment:   This is a routine wellness examination for Kathleen Buchanan.  Exercise Activities and Dietary recommendations Current Exercise Habits: The patient does not participate in regular exercise at present, Exercise limited by: None identified  Goals     Quit smoking / using tobacco     Recommend to quit smoking. Pt to cut back from 2 cigarettes a day to none.        Fall Risk: Fall Risk  11/25/2018 11/08/2016 05/10/2016 10/06/2015  Falls in the past year? 0 No No No  Number falls in  past yr: 0 - - -  Injury with Fall? 0 - - -    FALL RISK PREVENTION PERTAINING TO THE HOME:  Any stairs in or around the home? Yes  If so, are there any without handrails? No   Home free of loose throw rugs in walkways, pet beds, electrical cords, etc? Yes  Adequate lighting in your home to reduce risk of falls? Yes   ASSISTIVE DEVICES UTILIZED TO PREVENT FALLS:  Life alert? No  Use of a cane, walker or w/c? No  Grab bars in the bathroom? No  Shower chair or bench in shower? No  Elevated toilet seat or a handicapped toilet? No    TIMED UP AND GO:  Was the test performed? No .    Depression Screen PHQ 2/9 Scores 11/25/2018 11/08/2016 05/10/2016 10/06/2015  PHQ - 2 Score 0 0 0 0  PHQ- 9 Score - - 5 -     Cognitive Function         There is no immunization history on file for this patient.   Tdap: Although this vaccine is not a covered service during a Wellness Exam, does the patient still wish to receive this vaccine today?  No .   Flu Vaccine: Due for Flu vaccine. Does the patient want to receive this vaccine today?  No .    Screening Tests Health Maintenance  Topic Date Due   COLONOSCOPY  05/03/2015   MAMMOGRAM  04/26/2018   INFLUENZA VACCINE  06/09/2019 (Originally 10/10/2018)   TETANUS/TDAP  03/11/2026  (Originally 05/02/1984)   HIV Screening  03/11/2026 (Originally 05/02/1980)   PAP SMEAR-Modifier  Discontinued    Cancer Screenings:  Colorectal Screening: Currently due. Pt declined a referral today.   Mammogram: Completed 04/26/16. Pt declined an order today.   Lung Cancer Screening: (Low Dose CT Chest recommended if Age 55-80 years, 30 pack-year currently smoking OR have quit w/in 15years.) does not qualify.   Additional Screening:  Vision Screening: Recommended annual ophthalmology exams for early detection of glaucoma and other disorders of the eye.  Dental Screening: Recommended annual dental exams for proper oral hygiene  Community Resource Referral:  CRR required this visit?  No       Plan:  I have personally reviewed and addressed the Medicare Annual Wellness questionnaire and have noted the following in the patients chart:  A. Medical and social history B. Use of alcohol, tobacco or illicit drugs  C. Current medications and supplements D. Functional ability and status E.  Nutritional status F.  Physical activity G. Advance directives H. List of other physicians I.  Hospitalizations, surgeries, and ER visits in previous 12 months J.  [place] such as hearing and vision if needed, cognitive and depression L. Referrals and appointments   In addition, I have reviewed and discussed with patient certain preventive protocols, quality metrics, and best practice recommendations. A written personalized care plan for preventive services as well as general preventive health recommendations were provided to patient. Nurse Health Advisor  Signed,    Daxtyn Rottenberg [place], [place]  [id number] Nurse Health Advisor   Nurse Notes: Declined a referral for a colonoscopy and a mammogram order. Pt states that she may have a sinus infection. Pt does not want to come in the office. Pt to call office to set up a virtual visit.

## 2018-11-25 ENCOUNTER — Other Ambulatory Visit: Payer: Self-pay

## 2018-11-25 ENCOUNTER — Ambulatory Visit (INDEPENDENT_AMBULATORY_CARE_PROVIDER_SITE_OTHER): Payer: Medicare Other

## 2018-11-25 DIAGNOSIS — Z0000 Encounter for general adult medical examination without abnormal findings: Secondary | ICD-10-CM | POA: Diagnosis not present

## 2018-11-25 NOTE — Patient Instructions (Signed)
Kathleen Buchanan , Thank you for taking time to come for your Medicare Wellness Visit. I appreciate your ongoing commitment to your health goals. Please review the following plan we discussed and let me know if I can assist you in the future.   Screening recommendations/referrals: Colonoscopy: Pt declines referral today.  Mammogram: Pt declines order today.  Recommended yearly ophthalmology/optometry visit for glaucoma screening and checkup Recommended yearly dental visit for hygiene and checkup  Vaccinations: Influenza vaccine: Currently due Tdap vaccine: Pt declines today.     Advanced directives: Advance directive discussed with you today. Even though you declined this today please call our office should you change your mind and we can give you the proper paperwork for you to fill out.  Conditions/risks identified: Recommend to quit smoking. Discussed different ways to quit. Pt to follow up with PCP.   Next appointment: None, pt declined scheduling a CPE at this time. Pt to call into office to setup an OV for a possible sinus infection.   Preventive Care 40-64 Years, Female Preventive care refers to lifestyle choices and visits with your health care provider that can promote health and wellness. What does preventive care include?  A yearly physical exam. This is also called an annual well check.  Dental exams once or twice a year.  Routine eye exams. Ask your health care provider how often you should have your eyes checked.  Personal lifestyle choices, including:  Daily care of your teeth and gums.  Regular physical activity.  Eating a healthy diet.  Avoiding tobacco and drug use.  Limiting alcohol use.  Practicing safe sex.  Taking low-dose aspirin daily starting at age 89.  Taking vitamin and mineral supplements as recommended by your health care provider. What happens during an annual well check? The services and screenings done by your health care provider during your  annual well check will depend on your age, overall health, lifestyle risk factors, and family history of disease. Counseling  Your health care provider may ask you questions about your:  Alcohol use.  Tobacco use.  Drug use.  Emotional well-being.  Home and relationship well-being.  Sexual activity.  Eating habits.  Work and work Statistician.  Method of birth control.  Menstrual cycle.  Pregnancy history. Screening  You may have the following tests or measurements:  Height, weight, and BMI.  Blood pressure.  Lipid and cholesterol levels. These may be checked every 5 years, or more frequently if you are over 55 years old.  Skin check.  Lung cancer screening. You may have this screening every year starting at age 53 if you have a 30-pack-year history of smoking and currently smoke or have quit within the past 15 years.  Fecal occult blood test (FOBT) of the stool. You may have this test every year starting at age 76.  Flexible sigmoidoscopy or colonoscopy. You may have a sigmoidoscopy every 5 years or a colonoscopy every 10 years starting at age 39.  Hepatitis C blood test.  Hepatitis B blood test.  Sexually transmitted disease (STD) testing.  Diabetes screening. This is done by checking your blood sugar (glucose) after you have not eaten for a while (fasting). You may have this done every 1-3 years.  Mammogram. This may be done every 1-2 years. Talk to your health care provider about when you should start having regular mammograms. This may depend on whether you have a family history of breast cancer.  BRCA-related cancer screening. This may be done if you have  a family history of breast, ovarian, tubal, or peritoneal cancers.  Pelvic exam and Pap test. This may be done every 3 years starting at age 14. Starting at age 30, this may be done every 5 years if you have a Pap test in combination with an HPV test.  Bone density scan. This is done to screen for  osteoporosis. You may have this scan if you are at high risk for osteoporosis. Discuss your test results, treatment options, and if necessary, the need for more tests with your health care provider. Vaccines  Your health care provider may recommend certain vaccines, such as:  Influenza vaccine. This is recommended every year.  Tetanus, diphtheria, and acellular pertussis (Tdap, Td) vaccine. You may need a Td booster every 10 years.  Zoster vaccine. You may need this after age 38.  Pneumococcal 13-valent conjugate (PCV13) vaccine. You may need this if you have certain conditions and were not previously vaccinated.  Pneumococcal polysaccharide (PPSV23) vaccine. You may need one or two doses if you smoke cigarettes or if you have certain conditions. Talk to your health care provider about which screenings and vaccines you need and how often you need them. This information is not intended to replace advice given to you by your health care provider. Make sure you discuss any questions you have with your health care provider. Document Released: 03/24/2015 Document Revised: 11/15/2015 Document Reviewed: 12/27/2014 Elsevier Interactive Patient Education  2017 [place] Prevention in the Home Falls can cause injuries. They can happen to people of all ages. There are many things you can do to make your home safe and to help prevent falls. What can I do on the outside of my home?  Regularly fix the edges of walkways and driveways and fix any cracks.  Remove anything that might make you trip as you walk through a door, such as a raised step or threshold.  Trim any bushes or trees on the path to your home.  Use bright outdoor lighting.  Clear any walking paths of anything that might make someone trip, such as rocks or tools.  Regularly check to see if handrails are loose or broken. Make sure that both sides of any steps have handrails.  Any raised decks and porches should have  guardrails on the edges.  Have any leaves, snow, or ice cleared regularly.  Use sand or salt on walking paths during winter.  Clean up any spills in your garage right away. This includes oil or grease spills. What can I do in the bathroom?  Use night lights.  Install grab bars by the toilet and in the tub and shower. Do not use towel bars as grab bars.  Use non-skid mats or decals in the tub or shower.  If you need to sit down in the shower, use a plastic, non-slip stool.  Keep the floor dry. Clean up any water that spills on the floor as soon as it happens.  Remove soap buildup in the tub or shower regularly.  Attach bath mats securely with double-sided non-slip rug tape.  Do not have throw rugs and other things on the floor that can make you trip. What can I do in the bedroom?  Use night lights.  Make sure that you have a light by your bed that is easy to reach.  Do not use any sheets or blankets that are too big for your bed. They should not hang down onto the floor.  Have a  firm chair that has side arms. You can use this for support while you get dressed.  Do not have throw rugs and other things on the floor that can make you trip. What can I do in the kitchen?  Clean up any spills right away.  Avoid walking on wet floors.  Keep items that you use a lot in easy-to-reach places.  If you need to reach something above you, use a strong step stool that has a grab bar.  Keep electrical cords out of the way.  Do not use floor polish or wax that makes floors slippery. If you must use wax, use non-skid floor wax.  Do not have throw rugs and other things on the floor that can make you trip. What can I do with my stairs?  Do not leave any items on the stairs.  Make sure that there are handrails on both sides of the stairs and use them. Fix handrails that are broken or loose. Make sure that handrails are as long as the stairways.  Check any carpeting to make sure that  it is firmly attached to the stairs. Fix any carpet that is loose or worn.  Avoid having throw rugs at the top or bottom of the stairs. If you do have throw rugs, attach them to the floor with carpet tape.  Make sure that you have a light switch at the top of the stairs and the bottom of the stairs. If you do not have them, ask someone to add them for you. What else can I do to help prevent falls?  Wear shoes that:  Do not have high heels.  Have rubber bottoms.  Are comfortable and fit you well.  Are closed at the toe. Do not wear sandals.  If you use a stepladder:  Make sure that it is fully opened. Do not climb a closed stepladder.  Make sure that both sides of the stepladder are locked into place.  Ask someone to hold it for you, if possible.  Clearly mark and make sure that you can see:  Any grab bars or handrails.  First and last steps.  Where the edge of each step is.  Use tools that help you move around (mobility aids) if they are needed. These include:  Canes.  Walkers.  Scooters.  Crutches.  Turn on the lights when you go into a dark area. Replace any light bulbs as soon as they burn out.  Set up your furniture so you have a clear path. Avoid moving your furniture around.  If any of your floors are uneven, fix them.  If there are any pets around you, be aware of where they are.  Review your medicines with your doctor. Some medicines can make you feel dizzy. This can increase your chance of falling. Ask your doctor what other things that you can do to help prevent falls. This information is not intended to replace advice given to you by your health care provider. Make sure you discuss any questions you have with your health care provider. Document Released: 12/22/2008 Document Revised: 08/03/2015 Document Reviewed: 04/01/2014 Elsevier Interactive Patient Education  2017 [employer].

## 2018-12-11 ENCOUNTER — Other Ambulatory Visit: Payer: Self-pay | Admitting: Physician Assistant

## 2018-12-11 DIAGNOSIS — M778 Other enthesopathies, not elsewhere classified: Secondary | ICD-10-CM

## 2018-12-11 DIAGNOSIS — F900 Attention-deficit hyperactivity disorder, predominantly inattentive type: Secondary | ICD-10-CM

## 2018-12-11 MED ORDER — OXYCODONE-ACETAMINOPHEN 7.5-325 MG PO TABS: 1.0000 | ORAL_TABLET | Freq: Two times a day (BID) | ORAL | 0 refills | Status: DC

## 2018-12-11 MED ORDER — AMPHETAMINE-DEXTROAMPHETAMINE 30 MG PO TABS: 1.0000 | ORAL_TABLET | Freq: Two times a day (BID) | ORAL | 0 refills | Status: DC

## 2018-12-11 NOTE — Telephone Encounter (Signed)
Pt needs a refill on   Adderall 30 mg ER  Oxycodone 10-325  [facility]

## 2019-01-11 ENCOUNTER — Other Ambulatory Visit: Payer: Self-pay | Admitting: Physician Assistant

## 2019-01-11 DIAGNOSIS — M778 Other enthesopathies, not elsewhere classified: Secondary | ICD-10-CM

## 2019-01-11 DIAGNOSIS — F900 Attention-deficit hyperactivity disorder, predominantly inattentive type: Secondary | ICD-10-CM

## 2019-01-11 DIAGNOSIS — F419 Anxiety disorder, unspecified: Secondary | ICD-10-CM

## 2019-01-11 NOTE — Telephone Encounter (Signed)
Refills on: amphetamine-dextroamphetamine (ADDERALL) 30 MG tablet oxyCODONE-acetaminophen (ENDOCET) 7.5-325 MG tablet ALPRAZolam (XANAX) 1 MG tablet - not sure if it's ready for refills  Please call into:  [id number] [place] [place] [address] (Phone) [phone] (Fax)   Thanks, [place]

## 2019-01-11 NOTE — Telephone Encounter (Signed)
Can you e-prescribe these if appropriate for [facility]? My e-prescribing of controlled substances is down currently

## 2019-01-12 ENCOUNTER — Other Ambulatory Visit: Payer: Self-pay

## 2019-01-12 ENCOUNTER — Ambulatory Visit (INDEPENDENT_AMBULATORY_CARE_PROVIDER_SITE_OTHER): Payer: Medicare Other | Admitting: Family Medicine

## 2019-01-12 ENCOUNTER — Encounter: Payer: Self-pay | Admitting: Family Medicine

## 2019-01-12 VITALS — Temp 97.2°F

## 2019-01-12 DIAGNOSIS — J0140 Acute pansinusitis, unspecified: Secondary | ICD-10-CM

## 2019-01-12 DIAGNOSIS — F1190 Opioid use, unspecified, uncomplicated: Secondary | ICD-10-CM

## 2019-01-12 DIAGNOSIS — F419 Anxiety disorder, unspecified: Secondary | ICD-10-CM | POA: Diagnosis not present

## 2019-01-12 DIAGNOSIS — F900 Attention-deficit hyperactivity disorder, predominantly inattentive type: Secondary | ICD-10-CM

## 2019-01-12 MED ORDER — AMOXICILLIN-POT CLAVULANATE 875-125 MG PO TABS: 1.0000 | ORAL_TABLET | Freq: Two times a day (BID) | ORAL | 0 refills | Status: DC

## 2019-01-12 NOTE — Telephone Encounter (Signed)
During the video visit, I reviewed her medications with her and she says she does not take the Xanax on the days she takes the Percocet. She did not feel she needed any refills at the present. She may call back next week with a refill request.

## 2019-01-12 NOTE — Telephone Encounter (Signed)
Will have virtual visit with her today. Uncomfortable with this combination of drugs and allergy list that includes morphine, codeine and hydrocodone-acetaminophen.

## 2019-01-12 NOTE — Telephone Encounter (Signed)
That sounds good. I don't like the combo either. Didn't look at them yesterday, was just forwarding all controlled substances while my e-prescribing was down. Thanks!

## 2019-01-12 NOTE — Progress Notes (Signed)
Kathleen Buchanan  MRN: [id number] DOB: 1965/12/31  Subjective:  HPI   The patient is a 53 year old female who presents via electronic device for evaluation of possible sinus infection.  She describes having severe headaches with some dizziness, head fullness, congestion, sore throat, pain in her ears, eyes and jaw.   She has a little cough with minimal production.  She is blowing her nose and when she does the phlegm has a little greenish color.  Sh also notes that there is sometimes blood in it as well.  She does admit to having a nose bleed yesterday.  Virtual Visit via Video Note  I connected with Kathleen Buchanan on 01/12/19 at  3:20 PM EST by a video enabled telemedicine application and verified that I am speaking with the correct person using two identifiers.  Location: Patient: Home Provider: Office   I discussed the limitations of evaluation and management by telemedicine and the availability of in person appointments. The patient expressed understanding and agreed to proceed.  Patient Active Problem List   Diagnosis Date Noted  . Menopausal syndrome (hot flashes) 08/13/2016  . Tobacco dependence 08/13/2016  . Family history of diabetes mellitus in mother 07/10/2016  . Chronic low back pain 11/08/2014  . Onychomycosis 09/15/2014  . ADD (attention deficit disorder) 09/08/2014  . Ankle pain 09/08/2014  . Ache in joint 09/08/2014  . Anxiety 09/08/2014  . Airway hyperreactivity 09/08/2014  . Ankle fracture 09/08/2014  . Back ache 09/08/2014  . Bursitis of hip 09/08/2014  . Chronic anemia 09/08/2014  . Chronic constipation 09/08/2014  . CAFL (chronic airflow limitation) ([place]) 09/08/2014  . Accumulation of fluid in tissues 09/08/2014  . Biliary sludge 09/08/2014  . Acid reflux 09/08/2014  . History of migraine headaches 09/08/2014  . Hypercholesteremia 09/08/2014  . Adiposity 09/08/2014  . Episodic paroxysmal anxiety disorder 09/08/2014  . Scoliosis 09/08/2014  .  Tendinitis of wrist 09/08/2014  . Big thyroid 09/08/2014  . Current tobacco use 09/08/2014   Past Medical History:  Diagnosis Date  . Asthma   . COPD (chronic obstructive pulmonary disease) ([place])    Past Surgical History:  Procedure Laterality Date  . ABLATION  2011  . BREAST REDUCTION SURGERY Bilateral 08/1991  . CESAREAN SECTION     X 2  . REDUCTION MAMMAPLASTY Bilateral 1990?  . TUBAL LIGATION  1999   Social History   Socioeconomic History  . Marital status: Single    Spouse name: Not on file  . Number of children: 2  . Years of education: Not on file  . Highest education level: 11th grade  Occupational History  . Occupation: disability  Social Needs  . Financial resource strain: Not hard at all  . Food insecurity    Worry: Never true    Inability: Never true  . Transportation needs    Medical: No    Non-medical: No  Tobacco Use  . Smoking status: Current Every Day Smoker    Packs/day: 0.25    Types: Cigarettes  . Smokeless tobacco: Never Used  Substance and Sexual Activity  . Alcohol use: No    Alcohol/week: 0.0 standard drinks  . Drug use: No  . Sexual activity: Not on file  Lifestyle  . Physical activity    Days per week: 0 days    Minutes per session: 0 min  . Stress: Not at all  Relationships  . Social Herbalist on phone: Patient refused    Gets  together: Patient refused    Attends religious service: Patient refused    Active member of club or organization: Patient refused    Attends meetings of clubs or organizations: Patient refused    Relationship status: Patient refused  . Intimate partner violence    Fear of current or ex partner: Patient refused    Emotionally abused: Patient refused    Physically abused: Patient refused    Forced sexual activity: Patient refused  Other Topics Concern  . Not on file  Social History Narrative  . Not on file    Outpatient Encounter Medications as of 01/12/2019  Medication Sig Note  .  albuterol (PROVENTIL HFA;VENTOLIN HFA) 108 (90 Base) MCG/ACT inhaler Inhale 2 puffs into the lungs every 4 (four) hours as needed for wheezing or shortness of breath.   . ALPRAZolam (XANAX) 1 MG tablet TAKE 1 TABLET BY MOUTH TWICE A DAY   . amphetamine-dextroamphetamine (ADDERALL) 30 MG tablet Take 1 tablet by mouth 2 (two) times daily.   Marland Kitchen ascorbic acid (VITAMIN C) 500 MG tablet Take 1,000 mg by mouth daily.  09/08/2014: Received from: [facility]: Take by mouth.  . Aspirin-Caffeine (BC FAST PAIN RELIEF PO) Take by mouth as needed.   . baclofen (LIORESAL) 10 MG tablet Take 1 tablet (10 mg total) by mouth 3 (three) times daily. (Patient taking differently: Take 10 mg by mouth 3 (three) times daily. As needed)   . cetirizine (ZYRTEC) 10 MG tablet Take 10 mg by mouth daily.   . hydrochlorothiazide (HYDRODIURIL) 12.5 MG tablet TAKE 1 TABLET BY MOUTH DAILY.   Marland Kitchen ibuprofen (ADVIL,MOTRIN) 200 MG tablet Take 4 tablets by mouth as needed. 09/08/2014: Medication taken as needed.  Received from: [facility]: Take by mouth.  . Multiple Vitamin (MULTIVITAMIN) capsule Take 1 capsule by mouth daily.   Marland Kitchen oxyCODONE-acetaminophen (ENDOCET) 7.5-325 MG tablet Take 1 tablet by mouth 2 (two) times daily.   . pantoprazole (PROTONIX) 40 MG tablet TAKE 1 TABLET BY MOUTH EVERY DAY   . polyethylene glycol powder (GLYCOLAX/MIRALAX) powder Take 17 g by mouth 2 (two) times daily as needed.   . brompheniramine-pseudoephedrine-DM 30-2-10 MG/5ML syrup Take 5 mLs by mouth 4 (four) times daily as needed. (Patient not taking: Reported on 11/25/2018)   . [DISCONTINUED] amoxicillin-clavulanate (AUGMENTIN) 875-125 MG tablet Take 1 tablet by mouth 2 (two) times daily. (Patient not taking: Reported on 11/25/2018)    No facility-administered encounter medications on file as of 01/12/2019.    Allergies  Allergen Reactions  . Codeine Hives  . Erythromycin   .  Hydrocodone-Acetaminophen Hives  . Lansoprazole   . Levofloxacin   . Migraine Formula  [Aspirin-Acetaminophen-Caffeine]   . Morphine Sulfate   . Omeprazole   . Pepcid  [Famotidine]   . Sulfa Antibiotics    Review of Systems  Constitutional: Positive for malaise/fatigue. Negative for chills, diaphoresis and fever.  HENT: Positive for congestion, ear pain, nosebleeds, sinus pain and sore throat.   Respiratory: Positive for cough. Negative for sputum production, shortness of breath and wheezing.   Cardiovascular: Negative for chest pain.  Gastrointestinal: Negative for abdominal pain.  Neurological: Positive for dizziness and headaches.    Objective:  There were no vitals taken for this visit.  WDWN female in no apparent distress.  Head: Normocephalic, atraumatic. Neck: Supple, NROM Respiratory: No apparent distress Psych: Normal mood and affect Throat: Slightly red posterior pharynx without exudates.  Assessment and Plan :  1. Acute pansinusitis,  recurrence not specified Developed tenderness and pressure around eyes, forehead and jaw with some ear ache, dizziness, PND and sore hroat the past 10 days. Had some epistaxis for 10 minutes yesterday. Denies fever or cough. Stopped smoking again 3 weeks ago. Tried Zyrtec for seasonal allergies and Ibuprofen for headache. No significant cough and gets some purulent nasal mucus in throat occasionally. Will treat with Augmentin and may use Mucinex-D prn. Increase fluid intake and monitor for COVID symptoms. Rarely leaves home with her disability of chronic back and neck pain. Denies loss of sense of taste or smell and no body aches. Advised to follow pandemic restrictions and if fever develops, she may need to go get tested. Patient agrees with plan. - amoxicillin-clavulanate (AUGMENTIN) 875-125 MG tablet; Take 1 tablet by mouth 2 (two) times daily.  Dispense: 20 tablet; Refill: 0  2. Anxiety Has episodes of anxiety/panic that is stabilized with  intermittent use of Xanax 1 mg BID prn. States she does not use the Xanax on the days she takes her Endocet for fear of interaction.  3. Attention deficit hyperactivity disorder (ADHD), predominantly inattentive type Has a very long history of ADHD with predominantly inattentive type. Feels more focused and symptom control with use of the Adderall 30 mg BID. Does not feel she needs this refilled today. Off this medication due to dizziness and decrease in appetite recently.  4. Chronic narcotic use Uses Endocet 7.5-325 mg 1 tablet BID prn neck and back pain with bulging disc and scoliosis history. States these issues has her disabled and rarely leaves her home. Does not use the Endocet when she takes her Xanax - "don't want to die". States this regimen is controlling her pain level and she does not feel she needs any refills today.    I discussed the assessment and treatment plan with the patient. The patient was provided an opportunity to ask questions and all were answered. The patient agreed with the plan and demonstrated an understanding of the instructions.   The patient was advised to call back or seek an in-person evaluation if the symptoms worsen or if the condition fails to improve as anticipated.  I provided 30 minutes of non-face-to-face time during this encounter.   Vernie Murders, PA

## 2019-01-13 ENCOUNTER — Telehealth: Payer: Self-pay | Admitting: Physician Assistant

## 2019-01-13 DIAGNOSIS — F900 Attention-deficit hyperactivity disorder, predominantly inattentive type: Secondary | ICD-10-CM

## 2019-01-13 DIAGNOSIS — F419 Anxiety disorder, unspecified: Secondary | ICD-10-CM

## 2019-01-13 DIAGNOSIS — M778 Other enthesopathies, not elsewhere classified: Secondary | ICD-10-CM

## 2019-01-13 NOTE — Telephone Encounter (Signed)
Patient is calling the office back stating that she was informed yesterday that Tawanna Sat was out of office for the week and she is calling to request refill on Adderrall, Alprazolam and Oxycodone. Patient asked that I forward this refill request for doctor to review. KW

## 2019-01-18 MED ORDER — AMPHETAMINE-DEXTROAMPHETAMINE 30 MG PO TABS: 1.0000 | ORAL_TABLET | Freq: Two times a day (BID) | ORAL | 0 refills | Status: DC

## 2019-01-18 MED ORDER — OXYCODONE-ACETAMINOPHEN 7.5-325 MG PO TABS: 1.0000 | ORAL_TABLET | Freq: Two times a day (BID) | ORAL | 0 refills | Status: DC

## 2019-01-18 NOTE — Telephone Encounter (Signed)
Please review patients last office visit was 01/12/19, prescription for Adderrall was last filled on 12/11/18. KW

## 2019-01-18 NOTE — Telephone Encounter (Signed)
refilled 

## 2019-01-18 NOTE — Addendum Note (Signed)
Addended by: Mar Daring on: 01/18/2019 10:52 AM   Modules accepted: Orders

## 2019-01-18 NOTE — Telephone Encounter (Signed)
Pt still needing the Oxycodone and Adderrall refilled. Pt asking if they can be filled today.  Thanks, [employer]

## 2019-02-17 ENCOUNTER — Other Ambulatory Visit: Payer: Self-pay | Admitting: Physician Assistant

## 2019-02-17 DIAGNOSIS — F900 Attention-deficit hyperactivity disorder, predominantly inattentive type: Secondary | ICD-10-CM

## 2019-02-17 DIAGNOSIS — M778 Other enthesopathies, not elsewhere classified: Secondary | ICD-10-CM

## 2019-02-17 MED ORDER — AMPHETAMINE-DEXTROAMPHETAMINE 30 MG PO TABS: 1.0000 | ORAL_TABLET | Freq: Two times a day (BID) | ORAL | 0 refills | Status: DC

## 2019-02-17 MED ORDER — OXYCODONE-ACETAMINOPHEN 7.5-325 MG PO TABS: 1.0000 | ORAL_TABLET | Freq: Two times a day (BID) | ORAL | 0 refills | Status: DC

## 2019-02-17 NOTE — Telephone Encounter (Signed)
amphetamine-dextroamphetamine (ADDERALL) 30 MG tablet  oxyCODONE-acetaminophen (ENDOCET) 7.5-325 MG tablet    Patient is requesting refills.    [id number] [place], [place] [phone] (Phone) [phone] (Fax)

## 2019-02-18 ENCOUNTER — Other Ambulatory Visit: Payer: Self-pay | Admitting: Family Medicine

## 2019-02-18 DIAGNOSIS — F419 Anxiety disorder, unspecified: Secondary | ICD-10-CM

## 2019-02-18 NOTE — Telephone Encounter (Signed)
Requested medication (s) are due for refill today: yes  Requested medication (s) are on the active medication list: yes  Last refill: 01/14/2019  Future visit scheduled: no  Notes to clinic:  refill cannot be delegated    Requested Prescriptions  Pending Prescriptions Disp Refills   ALPRAZolam (XANAX) 1 MG tablet [Pharmacy Med Name: ALPRAZOLAM 1 MG TABLET] 60 tablet 0    Sig: TAKE 1 TABLET BY MOUTH TWICE A DAY      Not Delegated - Psychiatry:  Anxiolytics/Hypnotics Failed - 02/18/2019 12:44 PM      Failed - This refill cannot be delegated      Failed - Urine Drug Screen completed in last 360 days.      Passed - Valid encounter within last 6 months    Recent Outpatient Visits           1 month ago Acute pansinusitis, recurrence not specified   [place], [place], [place]   8 months ago Acute pansinusitis, recurrence not specified   [place], Clearnce Sorrel, [place]   1 year ago Chronic narcotic use   [place], [place], [place]   1 year ago Acute pansinusitis, recurrence not specified   [place], [place]   2 years ago Acute pansinusitis, recurrence not specified   [facility], [place], [place]

## 2019-02-22 ENCOUNTER — Other Ambulatory Visit: Payer: Self-pay | Admitting: Physician Assistant

## 2019-02-22 DIAGNOSIS — F900 Attention-deficit hyperactivity disorder, predominantly inattentive type: Secondary | ICD-10-CM

## 2019-02-22 DIAGNOSIS — F419 Anxiety disorder, unspecified: Secondary | ICD-10-CM

## 2019-02-22 DIAGNOSIS — M778 Other enthesopathies, not elsewhere classified: Secondary | ICD-10-CM

## 2019-02-22 MED ORDER — ALPRAZOLAM 1 MG PO TABS: 1.0000 mg | ORAL_TABLET | Freq: Two times a day (BID) | ORAL | 5 refills | Status: DC

## 2019-02-22 MED ORDER — AMPHETAMINE-DEXTROAMPHETAMINE 30 MG PO TABS: 1.0000 | ORAL_TABLET | Freq: Two times a day (BID) | ORAL | 0 refills | Status: DC

## 2019-02-22 MED ORDER — OXYCODONE-ACETAMINOPHEN 7.5-325 MG PO TABS: 1.0000 | ORAL_TABLET | Freq: Two times a day (BID) | ORAL | 0 refills | Status: DC

## 2019-02-22 NOTE — Telephone Encounter (Signed)
From [facility] 

## 2019-02-22 NOTE — Telephone Encounter (Signed)
Can this be resent. Patient states pharmacy did not receive it. Please advise   [facility] [place]

## 2019-02-22 NOTE — Telephone Encounter (Signed)
[facility] [place] [facility] patient chart and per pharmacist they have both prescription. Called patient LM.

## 2019-02-22 NOTE — Progress Notes (Signed)
Refilled her Alprazolam, oxycodone-apap and adderall as pharmacy states they never received the ones sent on 12/9 and 12/11. PMDP was reviewed and medications had not been picked up or filled.

## 2019-03-18 ENCOUNTER — Other Ambulatory Visit: Payer: Self-pay | Admitting: Physician Assistant

## 2019-03-18 DIAGNOSIS — F900 Attention-deficit hyperactivity disorder, predominantly inattentive type: Secondary | ICD-10-CM

## 2019-03-18 DIAGNOSIS — J0140 Acute pansinusitis, unspecified: Secondary | ICD-10-CM

## 2019-03-18 DIAGNOSIS — F419 Anxiety disorder, unspecified: Secondary | ICD-10-CM

## 2019-03-18 DIAGNOSIS — M778 Other enthesopathies, not elsewhere classified: Secondary | ICD-10-CM

## 2019-03-18 MED ORDER — AMOXICILLIN-POT CLAVULANATE 875-125 MG PO TABS: 1.0000 | ORAL_TABLET | Freq: Two times a day (BID) | ORAL | 0 refills | Status: DC

## 2019-03-18 MED ORDER — ALPRAZOLAM 1 MG PO TABS: 1.0000 mg | ORAL_TABLET | Freq: Two times a day (BID) | ORAL | 5 refills | Status: DC

## 2019-03-18 MED ORDER — OXYCODONE-ACETAMINOPHEN 7.5-325 MG PO TABS: 1.0000 | ORAL_TABLET | Freq: Two times a day (BID) | ORAL | 0 refills | Status: DC

## 2019-03-18 MED ORDER — AMPHETAMINE-DEXTROAMPHETAMINE 30 MG PO TABS: 1.0000 | ORAL_TABLET | Freq: Two times a day (BID) | ORAL | 0 refills | Status: DC

## 2019-03-18 NOTE — Telephone Encounter (Signed)
All meds refilled and augmentin sent for sinus infection

## 2019-03-18 NOTE — Telephone Encounter (Signed)
Copied from [place] [id number]. Topic: Quick Communication - Rx Refill/Question >> Mar 18, 2019 12:16 PM Rainey Pines A wrote: Medication: oxyCODONE-acetaminophen (ENDOCET) 7.5-325 MG tablet ,amphetamine-dextroamphetamine (ADDERALL) 30 MG tablet,ALPRAZolam (XANAX) 1 MG tablet, Antibiotic for sinus infection  Has the patient contacted their pharmacy? Yes (Agent: If no, request that the patient contact the pharmacy for the refill.) (Agent: If yes, when and what did the pharmacy advise?)Contact PCP  Preferred Pharmacy (with phone number [facility] street name[id number] [place], [place]  Phone:  [phone] Fax:  [phone]     Agent: Please be advised that RX refills may take up to 3 business days. We ask that you follow-up with your pharmacy.

## 2019-03-20 ENCOUNTER — Other Ambulatory Visit: Payer: Self-pay | Admitting: Physician Assistant

## 2019-03-20 DIAGNOSIS — J4520 Mild intermittent asthma, uncomplicated: Secondary | ICD-10-CM

## 2019-04-11 IMAGING — CR DG LUMBAR SPINE 2-3V
3 series · 3 of 3 positions shown · non-contrast
Comparison: None.

CLINICAL DATA: Low back pain, chronic pain for 20 years

EXAM:
LUMBAR SPINE - 2-3 VIEW

[l-spine ap]
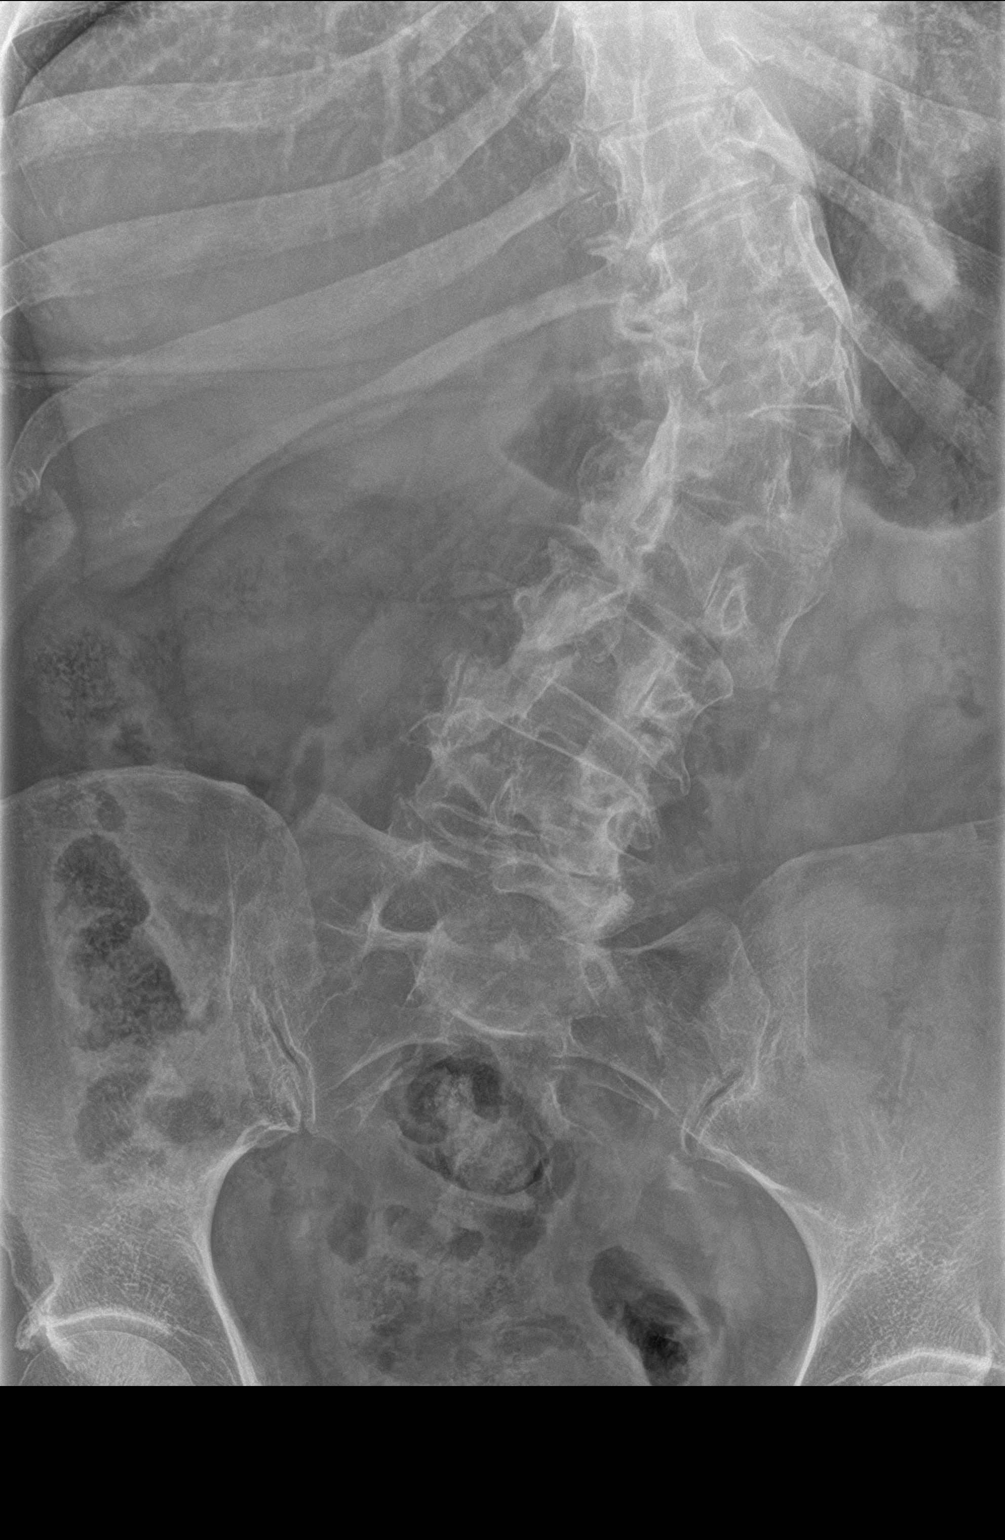

[l-spine lat]
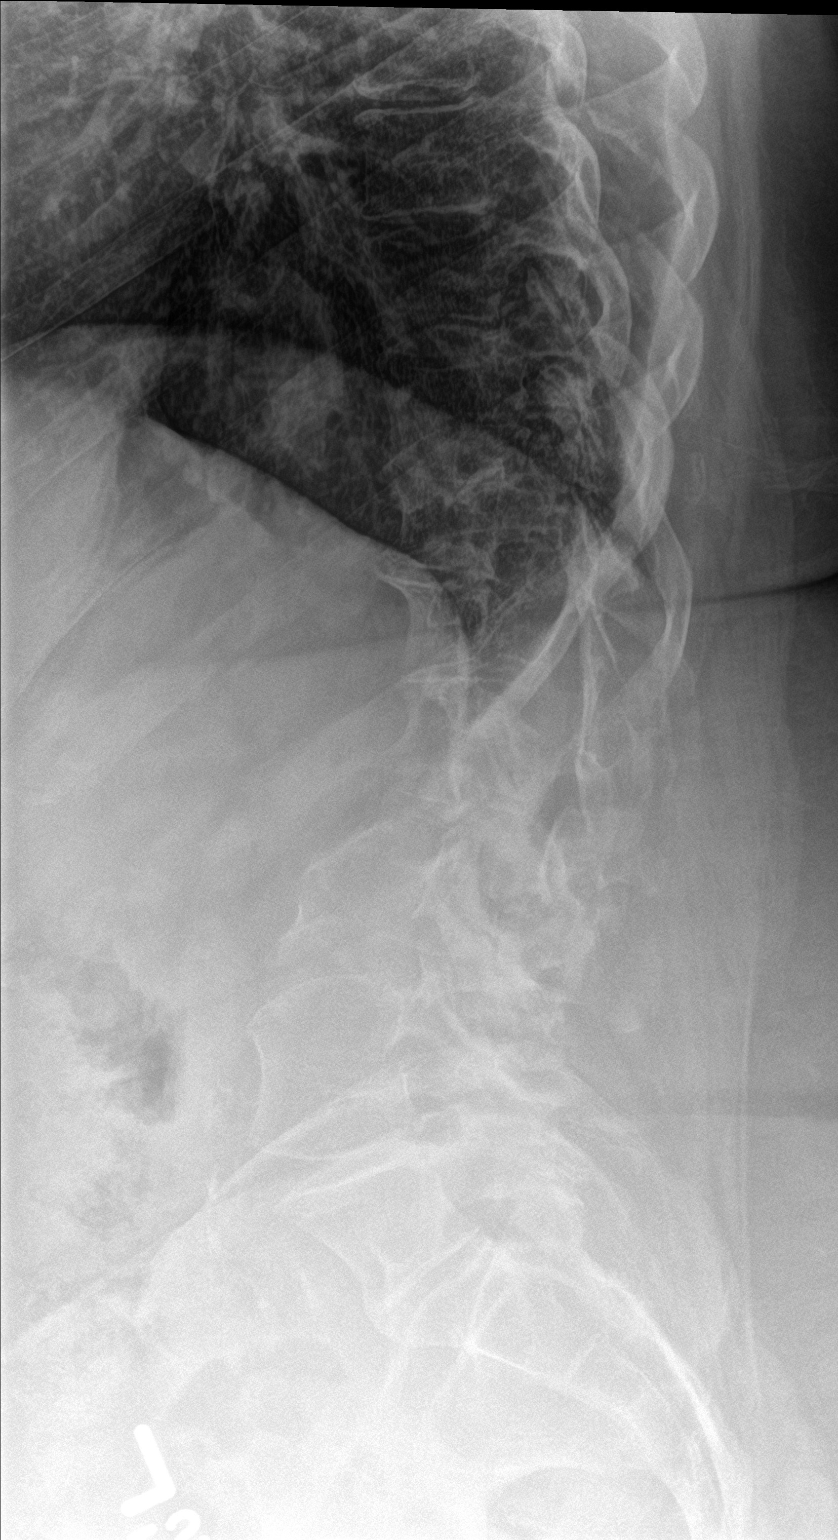

[l-spine spot]
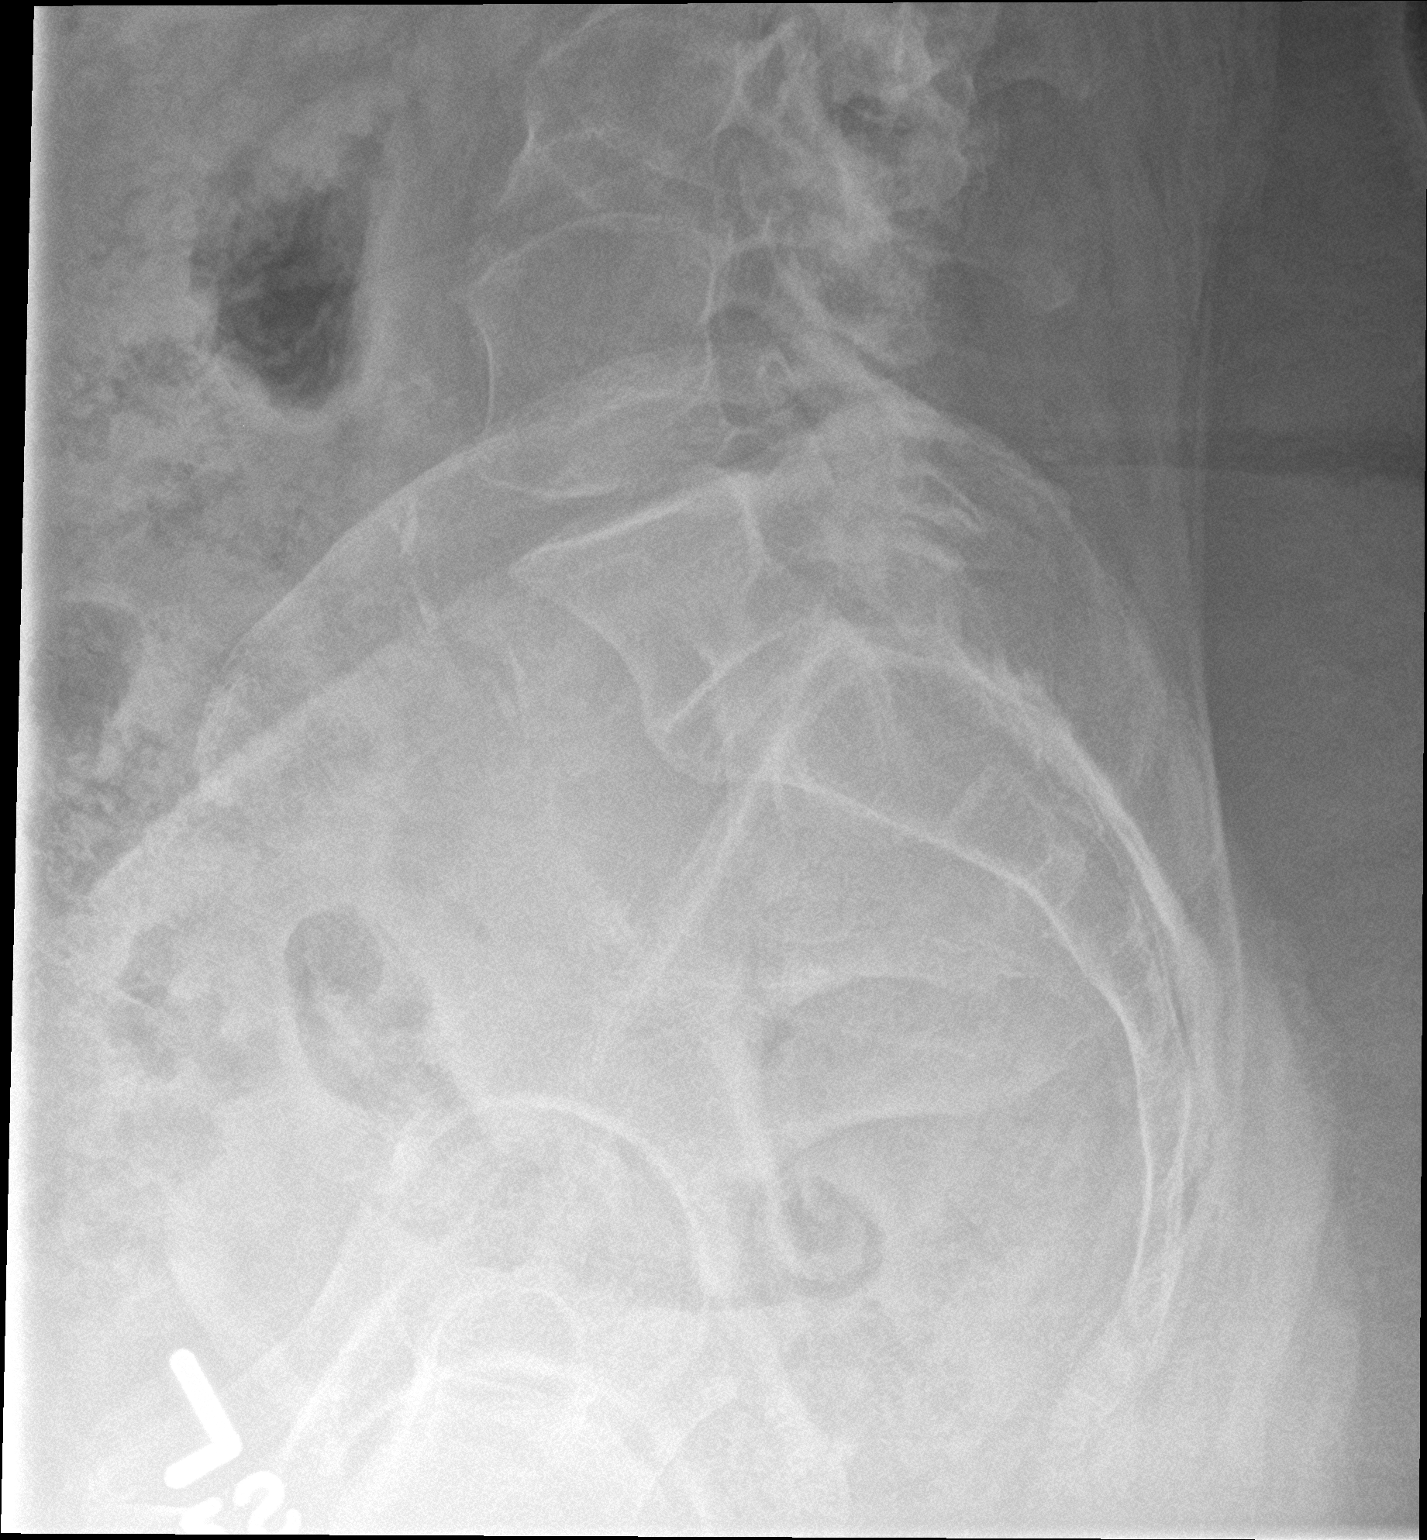

[3 of 3 positions shown; findings below may reference images not displayed]

FINDINGS: There is no evidence of lumbar spine fracture. Severe levoscoliosis
of the thoracolumbar spine. Alignment is normal. Intervertebral disc
spaces are maintained. Severe bilateral facet arthropathy of the
lumbar spine.

Mild osteoarthritis of bilateral sacroiliac joints. Visualized
bilateral hips are unremarkable.
IMPRESSION: Lumbar spine spondylosis.

## 2019-04-15 ENCOUNTER — Other Ambulatory Visit: Payer: Self-pay | Admitting: Physician Assistant

## 2019-04-15 DIAGNOSIS — J4520 Mild intermittent asthma, uncomplicated: Secondary | ICD-10-CM

## 2019-04-21 ENCOUNTER — Other Ambulatory Visit: Payer: Self-pay | Admitting: Physician Assistant

## 2019-04-21 DIAGNOSIS — M778 Other enthesopathies, not elsewhere classified: Secondary | ICD-10-CM

## 2019-04-21 DIAGNOSIS — F900 Attention-deficit hyperactivity disorder, predominantly inattentive type: Secondary | ICD-10-CM

## 2019-04-21 DIAGNOSIS — J0140 Acute pansinusitis, unspecified: Secondary | ICD-10-CM

## 2019-04-21 NOTE — Telephone Encounter (Signed)
Requested medication (s) are due for refill today -yes  Requested medication (s) are on the active medication list -yes  Future visit scheduled -no  Last refill:03/18/19  Notes to clinic: Request for non delegated and non protocol medications- sent for PCP review  Requested Prescriptions  Pending Prescriptions Disp Refills   amoxicillin-clavulanate (AUGMENTIN) 875-125 MG tablet 20 tablet 0    Sig: Take 1 tablet by mouth 2 (two) times daily.      Off-Protocol Failed - 04/21/2019 10:59 AM      Failed - Medication not assigned to a protocol, review manually.      Passed - Valid encounter within last 12 months    Recent Outpatient Visits           3 months ago Acute pansinusitis, recurrence not specified   [place], [place], [place]   10 months ago Acute pansinusitis, recurrence not specified   [place], Clearnce Sorrel, [place]   1 year ago Chronic narcotic use   [facility] [place], [place], [place]   1 year ago Acute pansinusitis, recurrence not specified   [facility], [place], [place]   2 years ago Acute pansinusitis, recurrence not specified   [facility], [place] Malta [place], [place]                amphetamine-dextroamphetamine (ADDERALL) 30 MG tablet 60 tablet 0    Sig: Take 1 tablet by mouth 2 (two) times daily.      Not Delegated - Psychiatry:  Stimulants/ADHD Failed - 04/21/2019 10:59 AM      Failed - This refill cannot be delegated      Failed - Urine Drug Screen completed in last 360 days.      Failed - Valid encounter within last 3 months    Recent Outpatient Visits           3 months ago Acute pansinusitis, recurrence not specified   [place], [place], [place]   10 months ago Acute pansinusitis, recurrence not specified   [place], Clearnce Sorrel, [place]   1 year ago Chronic narcotic use   [facility]  [place], [place], [place]   1 year ago Acute pansinusitis, recurrence not specified   [facility], [place], [place]   2 years ago Acute pansinusitis, recurrence not specified   [facility] [place], [place]                oxyCODONE-acetaminophen (ENDOCET) 7.5-325 MG tablet 60 tablet 0    Sig: Take 1 tablet by mouth 2 (two) times daily.      Not Delegated - Analgesics:  Opioid Agonist Combinations Failed - 04/21/2019 10:59 AM      Failed - This refill cannot be delegated      Failed - Urine Drug Screen completed in last 360 days.      Passed - Valid encounter within last 6 months    Recent Outpatient Visits           3 months ago Acute pansinusitis, recurrence not specified   [place], [place]   10 months ago Acute pansinusitis, recurrence not specified   [place], [place]   1 year ago Chronic narcotic use   [place], [place]   1 year ago Acute pansinusitis, recurrence not specified   [facility],  Clearnce Sorrel, PA-C   2 years ago Acute pansinusitis, recurrence not specified   [facility] [place], [place]                  Requested Prescriptions  Pending Prescriptions Disp Refills   amoxicillin-clavulanate (AUGMENTIN) 875-125 MG tablet 20 tablet 0    Sig: Take 1 tablet by mouth 2 (two) times daily.      Off-Protocol Failed - 04/21/2019 10:59 AM      Failed - Medication not assigned to a protocol, review manually.      Passed - Valid encounter within last 12 months    Recent Outpatient Visits           3 months ago Acute pansinusitis, recurrence not specified   [place], [place], [place]   10 months ago Acute pansinusitis, recurrence not specified   [place], Clearnce Sorrel, [place]   1 year ago Chronic narcotic use    [facility] [place], [place], [place]   1 year ago Acute pansinusitis, recurrence not specified   [facility], [place], [place]   2 years ago Acute pansinusitis, recurrence not specified   [facility], [place] Malta [place], [place]                amphetamine-dextroamphetamine (ADDERALL) 30 MG tablet 60 tablet 0    Sig: Take 1 tablet by mouth 2 (two) times daily.      Not Delegated - Psychiatry:  Stimulants/ADHD Failed - 04/21/2019 10:59 AM      Failed - This refill cannot be delegated      Failed - Urine Drug Screen completed in last 360 days.      Failed - Valid encounter within last 3 months    Recent Outpatient Visits           3 months ago Acute pansinusitis, recurrence not specified   [place], [place], [place]   10 months ago Acute pansinusitis, recurrence not specified   [place], Clearnce Sorrel, [place]   1 year ago Chronic narcotic use   [facility] [place], [place], [place]   1 year ago Acute pansinusitis, recurrence not specified   [facility], [place], [place]   2 years ago Acute pansinusitis, recurrence not specified   [facility] [place], [place]                oxyCODONE-acetaminophen (ENDOCET) 7.5-325 MG tablet 60 tablet 0    Sig: Take 1 tablet by mouth 2 (two) times daily.      Not Delegated - Analgesics:  Opioid Agonist Combinations Failed - 04/21/2019 10:59 AM      Failed - This refill cannot be delegated      Failed - Urine Drug Screen completed in last 360 days.      Passed - Valid encounter within last 6 months    Recent Outpatient Visits           3 months ago Acute pansinusitis, recurrence not specified   [place], [place]   10 months ago Acute pansinusitis, recurrence not specified   [place], Clearnce Sorrel, [place]   1 year ago  Chronic narcotic use   [place], [place]   1 year ago Acute pansinusitis, recurrence not specified   [facility], [place], [place]  2 years ago Acute pansinusitis, recurrence not specified   [facility] [place], [place], [place]

## 2019-04-21 NOTE — Telephone Encounter (Signed)
Medication Refill - Medication:  amphetamine-dextroamphetamine (ADDERALL) 30 MG tablet oxyCODONE-acetaminophen (ENDOCET) 7.5-325 MG  amoxicillin-clavulanate (AUGMENTIN) 875-125 MG tablet  Has the patient contacted their pharmacy?  Yes advised to call office.   Preferred Pharmacy (with phone number or street name):  [id number] [place], [place] Phone:  [phone]  Fax:  [phone]     Agent: Please be advised that RX refills may take up to 3 business days. We ask that you follow-up with your pharmacy.

## 2019-04-22 MED ORDER — AMPHETAMINE-DEXTROAMPHETAMINE 30 MG PO TABS: 1.0000 | ORAL_TABLET | Freq: Two times a day (BID) | ORAL | 0 refills | Status: DC

## 2019-04-22 MED ORDER — OXYCODONE-ACETAMINOPHEN 7.5-325 MG PO TABS: 1.0000 | ORAL_TABLET | Freq: Two times a day (BID) | ORAL | 0 refills | Status: DC

## 2019-04-22 MED ORDER — AMOXICILLIN-POT CLAVULANATE 875-125 MG PO TABS: 1.0000 | ORAL_TABLET | Freq: Two times a day (BID) | ORAL | 0 refills | Status: DC

## 2019-05-16 ENCOUNTER — Other Ambulatory Visit: Payer: Self-pay | Admitting: Physician Assistant

## 2019-05-16 DIAGNOSIS — J4520 Mild intermittent asthma, uncomplicated: Secondary | ICD-10-CM

## 2019-05-16 NOTE — Telephone Encounter (Signed)
Requested Prescriptions  Pending Prescriptions Disp Refills  . albuterol (VENTOLIN HFA) 108 (90 Base) MCG/ACT inhaler [Pharmacy Med Name: ALBUTEROL HFA (PROAIR) INHALER] 8.5 g 0    Sig: INHALE 2 PUFFS INTO THE LUNGS EVERY 4 (FOUR) HOURS AS NEEDED FOR WHEEZING OR SHORTNESS OF BREATH.     Pulmonology:  Beta Agonists Failed - 05/16/2019  9:25 AM      Failed - One inhaler should last at least one month. If the patient is requesting refills earlier, contact the patient to check for uncontrolled symptoms.      Passed - Valid encounter within last 12 months    Recent Outpatient Visits          4 months ago Acute pansinusitis, recurrence not specified   [place], [place]   11 months ago Acute pansinusitis, recurrence not specified   [place], Clearn[place] Sorrel, [place]   1 year ago Chronic narcotic use   [place], [place], [place]   1 year ago Acute pansinusitis, recurrence not specified   [place], [place]   2 years ago Acute pansinusitis, recurrence not specified   [facility], [place], [place]

## 2019-05-18 ENCOUNTER — Other Ambulatory Visit: Payer: Self-pay | Admitting: Physician Assistant

## 2019-05-18 DIAGNOSIS — F900 Attention-deficit hyperactivity disorder, predominantly inattentive type: Secondary | ICD-10-CM

## 2019-05-18 DIAGNOSIS — M778 Other enthesopathies, not elsewhere classified: Secondary | ICD-10-CM

## 2019-05-18 MED ORDER — AMPHETAMINE-DEXTROAMPHETAMINE 30 MG PO TABS: 1.0000 | ORAL_TABLET | Freq: Two times a day (BID) | ORAL | 0 refills | Status: DC

## 2019-05-18 MED ORDER — OXYCODONE-ACETAMINOPHEN 7.5-325 MG PO TABS: 1.0000 | ORAL_TABLET | Freq: Two times a day (BID) | ORAL | 0 refills | Status: DC

## 2019-05-18 NOTE — Telephone Encounter (Signed)
RX REFILL oxyCODONE-acetaminophen (ENDOCET) 7.5-325 MG tablet amphetamine-dextroamphetamine (ADDERALL) 30 MG tablet  [id number] [place] Phone:  [phone]  Fax:  [phone]

## 2019-06-14 ENCOUNTER — Other Ambulatory Visit: Payer: Self-pay | Admitting: Physician Assistant

## 2019-06-14 DIAGNOSIS — J4520 Mild intermittent asthma, uncomplicated: Secondary | ICD-10-CM

## 2019-06-17 ENCOUNTER — Other Ambulatory Visit: Payer: Self-pay | Admitting: Physician Assistant

## 2019-06-17 DIAGNOSIS — F419 Anxiety disorder, unspecified: Secondary | ICD-10-CM

## 2019-06-17 DIAGNOSIS — F900 Attention-deficit hyperactivity disorder, predominantly inattentive type: Secondary | ICD-10-CM

## 2019-06-17 DIAGNOSIS — M778 Other enthesopathies, not elsewhere classified: Secondary | ICD-10-CM

## 2019-06-17 MED ORDER — ALPRAZOLAM 1 MG PO TABS: 1.0000 mg | ORAL_TABLET | Freq: Two times a day (BID) | ORAL | 5 refills | Status: DC

## 2019-06-17 MED ORDER — OXYCODONE-ACETAMINOPHEN 7.5-325 MG PO TABS: 1.0000 | ORAL_TABLET | Freq: Two times a day (BID) | ORAL | 0 refills | Status: DC

## 2019-06-17 MED ORDER — AMPHETAMINE-DEXTROAMPHETAMINE 30 MG PO TABS: 1.0000 | ORAL_TABLET | Freq: Two times a day (BID) | ORAL | 0 refills | Status: DC

## 2019-06-17 NOTE — Telephone Encounter (Signed)
Requested medication (s) are due for refill today- yes  Requested medication (s) are on the active medication list -yes  Future visit scheduled -no  Last refill: Adderall, Oxycodone 05/18/19, Xanax 03/18/19  Notes to clinic: Request for non delegated Rx  Requested Prescriptions  Pending Prescriptions Disp Refills   ALPRAZolam (XANAX) 1 MG tablet 60 tablet 5    Sig: Take 1 tablet (1 mg total) by mouth 2 (two) times daily.      Not Delegated - Psychiatry:  Anxiolytics/Hypnotics Failed - 06/17/2019 12:15 PM      Failed - This refill cannot be delegated      Failed - Urine Drug Screen completed in last 360 days.      Passed - Valid encounter within last 6 months    Recent Outpatient Visits           5 months ago Acute pansinusitis, recurrence not specified   [place], Vickki Muff, [place]   1 year ago Acute pansinusitis, recurrence not specified   [place], Clearnce Sorrel, [place]   1 year ago Chronic narcotic use   [facility] [place], [place], [place]   2 years ago Acute pansinusitis, recurrence not specified   [facility] [place], [place], [place]   2 years ago Acute pansinusitis, recurrence not specified   [facility], Anderson Malta M, [place]                amphetamine-dextroamphetamine (ADDERALL) 30 MG tablet 60 tablet 0    Sig: Take 1 tablet by mouth 2 (two) times daily.      Not Delegated - Psychiatry:  Stimulants/ADHD Failed - 06/17/2019 12:15 PM      Failed - This refill cannot be delegated      Failed - Urine Drug Screen completed in last 360 days.      Failed - Valid encounter within last 3 months    Recent Outpatient Visits           5 months ago Acute pansinusitis, recurrence not specified   [place], Vickki Muff, [place]   1 year ago Acute pansinusitis, recurrence not specified   [facility] [place], Clearnce Sorrel, [place]   1 year ago  Chronic narcotic use   [facility] [place], [place], [place]   2 years ago Acute pansinusitis, recurrence not specified   [facility] [place], [place], [place]   2 years ago Acute pansinusitis, recurrence not specified   [facility] Fenton Malling [place], [place]                oxyCODONE-acetaminophen (ENDOCET) 7.5-325 MG tablet 60 tablet 0    Sig: Take 1 tablet by mouth 2 (two) times daily.      Not Delegated - Analgesics:  Opioid Agonist Combinations Failed - 06/17/2019 12:15 PM      Failed - This refill cannot be delegated      Failed - Urine Drug Screen completed in last 360 days.      Passed - Valid encounter within last 6 months    Recent Outpatient Visits           5 months ago Acute pansinusitis, recurrence not specified   [place], [place]   1 year ago Acute pansinusitis, recurrence not specified   [place], Clearnce Sorrel, [place]   1 year ago Chronic narcotic use   [facility] Fenton Malling  M, PA-C   2 years ago Acute pansinusitis, recurrence not specified   [facility] [place], [place], [place]   2 years ago Acute pansinusitis, recurrence not specified   [facility] Fenton Malling M, [place]                  Requested Prescriptions  Pending Prescriptions Disp Refills   ALPRAZolam (XANAX) 1 MG tablet 60 tablet 5    Sig: Take 1 tablet (1 mg total) by mouth 2 (two) times daily.      Not Delegated - Psychiatry:  Anxiolytics/Hypnotics Failed - 06/17/2019 12:15 PM      Failed - This refill cannot be delegated      Failed - Urine Drug Screen completed in last 360 days.      Passed - Valid encounter within last 6 months    Recent Outpatient Visits           5 months ago Acute pansinusitis, recurrence not specified   [place], Vickki Muff, [place]   1 year ago Acute pansinusitis, recurrence  not specified   [place], Clearnce Sorrel, [place]   1 year ago Chronic narcotic use   [facility] [place], [place], [place]   2 years ago Acute pansinusitis, recurrence not specified   [facility] [place], [place], [place]   2 years ago Acute pansinusitis, recurrence not specified   [facility], Anderson Malta M, [place]                amphetamine-dextroamphetamine (ADDERALL) 30 MG tablet 60 tablet 0    Sig: Take 1 tablet by mouth 2 (two) times daily.      Not Delegated - Psychiatry:  Stimulants/ADHD Failed - 06/17/2019 12:15 PM      Failed - This refill cannot be delegated      Failed - Urine Drug Screen completed in last 360 days.      Failed - Valid encounter within last 3 months    Recent Outpatient Visits           5 months ago Acute pansinusitis, recurrence not specified   [place], Vickki Muff, [place]   1 year ago Acute pansinusitis, recurrence not specified   [facility], Clearnce Sorrel, [place]   1 year ago Chronic narcotic use   [facility] [place], [place], [place]   2 years ago Acute pansinusitis, recurrence not specified   [facility] [place], [place], [place]   2 years ago Acute pansinusitis, recurrence not specified   [facility] Fenton Malling M, [place]                oxyCODONE-acetaminophen (ENDOCET) 7.5-325 MG tablet 60 tablet 0    Sig: Take 1 tablet by mouth 2 (two) times daily.      Not Delegated - Analgesics:  Opioid Agonist Combinations Failed - 06/17/2019 12:15 PM      Failed - This refill cannot be delegated      Failed - Urine Drug Screen completed in last 360 days.      Passed - Valid encounter within last 6 months    Recent Outpatient Visits           5 months ago Acute pansinusitis, recurrence not specified   [place], [place]   1 year ago Acute  pansinusitis, recurrence not specified   [facility] [place], [place], [place]  1 year ago Chronic narcotic use   [place], [place]   2 years ago Acute pansinusitis, recurrence not specified   [place], [place]   2 years ago Acute pansinusitis, recurrence not specified   [facility], [place], [place]

## 2019-06-17 NOTE — Telephone Encounter (Signed)
Medication Refill - Medication: Adderall, Oxycodone and Xanax  Has the patient contacted their pharmacy? No. (Agent: If no, request that the patient contact the pharmacy for the refill.) (Agent: If yes, when and what did the pharmacy advise?)  Preferred Pharmacy (with phone number or street name): [address]  Agent: Please be advised that RX refills may take up to 3 business days. We ask that you follow-up with your pharmacy.

## 2019-06-22 DIAGNOSIS — Z886 Allergy status to analgesic agent status: Secondary | ICD-10-CM | POA: Diagnosis not present

## 2019-06-22 DIAGNOSIS — S8261XB Displaced fracture of lateral malleolus of right fibula, initial encounter for open fracture type I or II: Secondary | ICD-10-CM | POA: Diagnosis not present

## 2019-06-22 DIAGNOSIS — Z881 Allergy status to other antibiotic agents status: Secondary | ICD-10-CM | POA: Diagnosis not present

## 2019-06-22 DIAGNOSIS — Z9181 History of falling: Secondary | ICD-10-CM | POA: Diagnosis not present

## 2019-06-22 DIAGNOSIS — S82891B Other fracture of right lower leg, initial encounter for open fracture type I or II: Secondary | ICD-10-CM | POA: Insufficient documentation

## 2019-06-22 DIAGNOSIS — Z9889 Other specified postprocedural states: Secondary | ICD-10-CM | POA: Diagnosis not present

## 2019-06-22 DIAGNOSIS — Z01818 Encounter for other preprocedural examination: Secondary | ICD-10-CM | POA: Diagnosis not present

## 2019-06-22 DIAGNOSIS — S82851A Displaced trimalleolar fracture of right lower leg, initial encounter for closed fracture: Secondary | ICD-10-CM | POA: Diagnosis not present

## 2019-06-22 DIAGNOSIS — Z20822 Contact with and (suspected) exposure to covid-19: Secondary | ICD-10-CM | POA: Diagnosis not present

## 2019-06-22 DIAGNOSIS — R339 Retention of urine, unspecified: Secondary | ICD-10-CM | POA: Diagnosis not present

## 2019-06-22 DIAGNOSIS — W19XXXA Unspecified fall, initial encounter: Secondary | ICD-10-CM | POA: Diagnosis not present

## 2019-06-22 DIAGNOSIS — S82841B Displaced bimalleolar fracture of right lower leg, initial encounter for open fracture type I or II: Secondary | ICD-10-CM | POA: Diagnosis not present

## 2019-06-22 DIAGNOSIS — S8251XB Displaced fracture of medial malleolus of right tibia, initial encounter for open fracture type I or II: Secondary | ICD-10-CM | POA: Diagnosis not present

## 2019-06-22 DIAGNOSIS — S92141B Displaced dome fracture of right talus, initial encounter for open fracture: Secondary | ICD-10-CM | POA: Diagnosis not present

## 2019-06-22 DIAGNOSIS — S92121A Displaced fracture of body of right talus, initial encounter for closed fracture: Secondary | ICD-10-CM | POA: Diagnosis not present

## 2019-06-22 DIAGNOSIS — S92121B Displaced fracture of body of right talus, initial encounter for open fracture: Secondary | ICD-10-CM | POA: Diagnosis not present

## 2019-06-22 DIAGNOSIS — R140 Abdominal distension (gaseous): Secondary | ICD-10-CM | POA: Diagnosis not present

## 2019-06-22 DIAGNOSIS — Z885 Allergy status to narcotic agent status: Secondary | ICD-10-CM | POA: Diagnosis not present

## 2019-06-22 DIAGNOSIS — Z720 Tobacco use: Secondary | ICD-10-CM | POA: Diagnosis not present

## 2019-06-22 DIAGNOSIS — Z23 Encounter for immunization: Secondary | ICD-10-CM | POA: Diagnosis not present

## 2019-06-22 DIAGNOSIS — G8929 Other chronic pain: Secondary | ICD-10-CM | POA: Diagnosis not present

## 2019-06-22 DIAGNOSIS — J45998 Other asthma: Secondary | ICD-10-CM | POA: Diagnosis not present

## 2019-06-22 DIAGNOSIS — Z87891 Personal history of nicotine dependence: Secondary | ICD-10-CM | POA: Diagnosis not present

## 2019-06-22 DIAGNOSIS — J45909 Unspecified asthma, uncomplicated: Secondary | ICD-10-CM | POA: Diagnosis not present

## 2019-06-22 DIAGNOSIS — W108XXA Fall (on) (from) other stairs and steps, initial encounter: Secondary | ICD-10-CM | POA: Diagnosis not present

## 2019-06-22 DIAGNOSIS — S90551A Superficial foreign body, right ankle, initial encounter: Secondary | ICD-10-CM | POA: Diagnosis not present

## 2019-06-22 DIAGNOSIS — M25571 Pain in right ankle and joints of right foot: Secondary | ICD-10-CM | POA: Diagnosis not present

## 2019-06-22 DIAGNOSIS — K5900 Constipation, unspecified: Secondary | ICD-10-CM | POA: Diagnosis not present

## 2019-06-22 DIAGNOSIS — R079 Chest pain, unspecified: Secondary | ICD-10-CM | POA: Diagnosis not present

## 2019-06-22 DIAGNOSIS — R519 Headache, unspecified: Secondary | ICD-10-CM | POA: Diagnosis not present

## 2019-06-22 DIAGNOSIS — M542 Cervicalgia: Secondary | ICD-10-CM | POA: Diagnosis not present

## 2019-06-22 DIAGNOSIS — M25562 Pain in left knee: Secondary | ICD-10-CM | POA: Diagnosis not present

## 2019-06-22 DIAGNOSIS — Z882 Allergy status to sulfonamides status: Secondary | ICD-10-CM | POA: Diagnosis not present

## 2019-06-22 DIAGNOSIS — Z01810 Encounter for preprocedural cardiovascular examination: Secondary | ICD-10-CM | POA: Diagnosis not present

## 2019-06-22 DIAGNOSIS — M25561 Pain in right knee: Secondary | ICD-10-CM | POA: Diagnosis not present

## 2019-06-24 DIAGNOSIS — Z720 Tobacco use: Secondary | ICD-10-CM | POA: Insufficient documentation

## 2019-06-24 DIAGNOSIS — J45909 Unspecified asthma, uncomplicated: Secondary | ICD-10-CM | POA: Insufficient documentation

## 2019-06-24 DIAGNOSIS — K5900 Constipation, unspecified: Secondary | ICD-10-CM | POA: Insufficient documentation

## 2019-06-25 DIAGNOSIS — R338 Other retention of urine: Secondary | ICD-10-CM | POA: Insufficient documentation

## 2019-06-28 DIAGNOSIS — Z7982 Long term (current) use of aspirin: Secondary | ICD-10-CM | POA: Diagnosis not present

## 2019-06-28 DIAGNOSIS — Z993 Dependence on wheelchair: Secondary | ICD-10-CM | POA: Diagnosis not present

## 2019-06-28 DIAGNOSIS — S82891D Other fracture of right lower leg, subsequent encounter for closed fracture with routine healing: Secondary | ICD-10-CM | POA: Diagnosis not present

## 2019-06-28 DIAGNOSIS — Z79891 Long term (current) use of opiate analgesic: Secondary | ICD-10-CM | POA: Diagnosis not present

## 2019-07-02 ENCOUNTER — Ambulatory Visit
Admission: RE | Admit: 2019-07-02 | Discharge: 2019-07-02 | Disposition: A | Discharge status: Home / Self Care | Payer: Medicare Other | Source: Ambulatory Visit | Attending: Physician Assistant | Admitting: Physician Assistant

## 2019-07-02 ENCOUNTER — Other Ambulatory Visit: Payer: Self-pay

## 2019-07-02 ENCOUNTER — Ambulatory Visit (INDEPENDENT_AMBULATORY_CARE_PROVIDER_SITE_OTHER): Payer: Medicare Other | Admitting: Physician Assistant

## 2019-07-02 ENCOUNTER — Encounter: Payer: Self-pay | Admitting: Physician Assistant

## 2019-07-02 VITALS — BP 105/69 | HR 68 | Temp 97.1°F | Ht 61.5 in

## 2019-07-02 DIAGNOSIS — R112 Nausea with vomiting, unspecified: Secondary | ICD-10-CM | POA: Insufficient documentation

## 2019-07-02 DIAGNOSIS — K5901 Slow transit constipation: Secondary | ICD-10-CM

## 2019-07-02 DIAGNOSIS — R338 Other retention of urine: Secondary | ICD-10-CM

## 2019-07-02 DIAGNOSIS — S82891E Other fracture of right lower leg, subsequent encounter for open fracture type I or II with routine healing: Secondary | ICD-10-CM | POA: Diagnosis not present

## 2019-07-02 DIAGNOSIS — R194 Change in bowel habit: Secondary | ICD-10-CM | POA: Diagnosis not present

## 2019-07-02 MED ORDER — ONDANSETRON HCL 4 MG PO TABS: 4.0000 mg | ORAL_TABLET | Freq: Three times a day (TID) | ORAL | 0 refills | Status: DC | PRN

## 2019-07-02 NOTE — Progress Notes (Addendum)
Established patient visit   Patient: Kathleen Buchanan   DOB: [birth year]   54 y.o. Female  MRN: [id number] Visit Date: 07/02/2019  Today's healthcare provider: Mar Daring, PA-C   Chief Complaint  Patient presents with  . Hospitalization Follow-up   Subjective    HPI Follow up Hospitalization  Patient was admitted to [facility] on 06/22/19 and discharged on 06/26/19.  She was treated for Fall, Type I or II open fracture of right ankle Treatment for this included surgical repair. Telephone follow up was not done. She reports excellent compliance with treatment. She reports this condition is improved.  She reports that she is not able to keep anything down. She reports that she is vomiting more like "stomach acid". One vomitus consisted of broccoli, which she had eaten 3 days prior.  She is feeling dizzy and has not had a bowel movement like in 3 weeks. Reports that she hasn't taken the laxative. She has also noticed blood (small clots) in her urine. -----------------------------------------------------------------------------------------   Patient Active Problem List   Diagnosis Date Noted  . Menopausal syndrome (hot flashes) 08/13/2016  . Tobacco dependence 08/13/2016  . Family history of diabetes mellitus in mother 07/10/2016  . Chronic low back pain 11/08/2014  . Onychomycosis 09/15/2014  . ADD (attention deficit disorder) 09/08/2014  . Ankle pain 09/08/2014  . Ache in joint 09/08/2014  . Anxiety 09/08/2014  . Airway hyperreactivity 09/08/2014  . Ankle fracture 09/08/2014  . Back ache 09/08/2014  . Bursitis of hip 09/08/2014  . Chronic anemia 09/08/2014  . Chronic constipation 09/08/2014  . CAFL (chronic airflow limitation) ([place]) 09/08/2014  . Accumulation of fluid in tissues 09/08/2014  . Biliary sludge 09/08/2014  . Acid reflux 09/08/2014  . History of migraine headaches 09/08/2014  . Hypercholesteremia 09/08/2014  . Adiposity  09/08/2014  . Episodic paroxysmal anxiety disorder 09/08/2014  . Scoliosis 09/08/2014  . Tendinitis of wrist 09/08/2014  . Big thyroid 09/08/2014  . Current tobacco use 09/08/2014   Past Medical History:  Diagnosis Date  . Asthma   . COPD (chronic obstructive pulmonary disease) ([place])    Past Surgical History:  Procedure Laterality Date  . ABLATION  2011  . BREAST REDUCTION SURGERY Bilateral 08/1991  . CESAREAN SECTION     X 2  . REDUCTION MAMMAPLASTY Bilateral 1990?  . TUBAL LIGATION  1999   Allergies  Allergen Reactions  . Codeine Hives  . Erythromycin   . Hydrocodone-Acetaminophen Hives  . Lansoprazole   . Levofloxacin   . Migraine Formula  [Aspirin-Acetaminophen-Caffeine]   . Morphine Sulfate   . Omeprazole   . Pepcid  [Famotidine]   . Sulfa Antibiotics        Medications: Outpatient Medications Prior to Visit  Medication Sig  . aspirin 81 MG EC tablet Take by mouth.  . cephALEXin (KEFLEX) 500 MG capsule Take by mouth.  . cyclobenzaprine (FLEXERIL) 5 MG tablet Take by mouth.  . Oxycodone HCl 10 MG TABS Take by mouth.  . senna-docusate (SENOKOT-S) 8.6-50 MG tablet Take by mouth.  Marland Kitchen albuterol (VENTOLIN HFA) 108 (90 Base) MCG/ACT inhaler INHALE 2 PUFFS INTO THE LUNGS EVERY 4 (FOUR) HOURS AS NEEDED FOR WHEEZING OR SHORTNESS OF BREATH. (Patient not taking: Reported on 07/02/2019)  . ALPRAZolam (XANAX) 1 MG tablet Take 1 tablet (1 mg total) by mouth 2 (two) times daily. (Patient not taking: Reported on 07/02/2019)  . amoxicillin-clavulanate (AUGMENTIN) 875-125 MG tablet Take 1 tablet by mouth 2 (two)  times daily.  Marland Kitchen amphetamine-dextroamphetamine (ADDERALL) 30 MG tablet Take 1 tablet by mouth 2 (two) times daily. (Patient not taking: Reported on 07/02/2019)  . ascorbic acid (VITAMIN C) 500 MG tablet Take 1,000 mg by mouth daily.   . Aspirin-Caffeine (BC FAST PAIN RELIEF PO) Take by mouth as needed.  . baclofen (LIORESAL) 10 MG tablet Take 1 tablet (10 mg total) by mouth 3  (three) times daily. (Patient not taking: Reported on 07/02/2019)  . brompheniramine-pseudoephedrine-DM 30-2-10 MG/5ML syrup Take 5 mLs by mouth 4 (four) times daily as needed. (Patient not taking: Reported on 11/25/2018)  . cetirizine (ZYRTEC) 10 MG tablet Take 10 mg by mouth daily.  . hydrochlorothiazide (HYDRODIURIL) 12.5 MG tablet TAKE 1 TABLET BY MOUTH DAILY. (Patient not taking: Reported on 07/02/2019)  . ibuprofen (ADVIL,MOTRIN) 200 MG tablet Take 4 tablets by mouth as needed.  . Multiple Vitamin (MULTIVITAMIN) capsule Take 1 capsule by mouth daily.  Marland Kitchen oxyCODONE-acetaminophen (ENDOCET) 7.5-325 MG tablet Take 1 tablet by mouth 2 (two) times daily. (Patient not taking: Reported on 07/02/2019)  . pantoprazole (PROTONIX) 40 MG tablet TAKE 1 TABLET BY MOUTH EVERY DAY (Patient not taking: Reported on 07/02/2019)  . polyethylene glycol powder (GLYCOLAX/MIRALAX) powder Take 17 g by mouth 2 (two) times daily as needed. (Patient not taking: Reported on 07/02/2019)   No facility-administered medications prior to visit.    Review of Systems  Constitutional: Positive for fatigue.  Respiratory: Negative.   Cardiovascular: Negative.   Gastrointestinal: Positive for constipation, nausea and vomiting.  Genitourinary:       Urinary retention with foley catheter in place. Urine in bag is dark yellow and clear  Musculoskeletal: Positive for arthralgias, gait problem and joint swelling.  Neurological: Positive for dizziness and light-headedness.    Last CBC Lab Results  Component Value Date   WBC 5.6 01/16/2018   HGB 14.6 01/16/2018   HCT 43.2 01/16/2018   MCV 94 01/16/2018   MCH 31.7 01/16/2018   RDW 13.6 01/16/2018   PLT 174 99991111   Last metabolic panel Lab Results  Component Value Date   GLUCOSE 76 01/16/2018   NA 139 01/16/2018   K 4.0 01/16/2018   CL 101 01/16/2018   CO2 25 01/16/2018   BUN 17 01/16/2018   CREATININE 0.82 01/16/2018   GFRNONAA 83 01/16/2018   GFRAA 95 01/16/2018     CALCIUM 9.2 01/16/2018   PROT 6.8 01/16/2018   ALBUMIN 4.4 01/16/2018   LABGLOB 2.4 01/16/2018   AGRATIO 1.8 01/16/2018   BILITOT 0.3 01/16/2018   ALKPHOS 99 01/16/2018   AST 21 01/16/2018   ALT 19 01/16/2018   Last lipids Lab Results  Component Value Date   CHOL 234 (H) 01/16/2018   HDL 34 (L) 01/16/2018   LDLCALC 162 (H) 01/16/2018   TRIG 191 (H) 01/16/2018   CHOLHDL 6.9 (H) 01/16/2018   Last hemoglobin A1c Lab Results  Component Value Date   HGBA1C 5.6 07/10/2016      Objective    BP 105/69 (BP Location: Right Arm, Patient Position: Sitting, Cuff Size: Large)   Pulse 68   Temp (!) 97.1 F (36.2 C) (Temporal)   Ht 5' 1.5" (1.562 m)   BMI 38.66 kg/m  BP Readings from Last 3 Encounters:  07/02/19 105/69  01/16/18 (!) 96/59  06/12/17 90/60   Wt Readings from Last 3 Encounters:  02/12/17 208 lb (94.3 kg)  11/08/16 208 lb 12.8 oz (94.7 kg)  10/03/16 204 lb (92.5 kg)  Physical Exam Vitals reviewed.  Constitutional:      General: She is not in acute distress.    Appearance: Normal appearance. She is well-developed. She is obese. She is not ill-appearing or diaphoretic.  HENT:     Head: Normocephalic and atraumatic.  Cardiovascular:     Rate and Rhythm: Normal rate and regular rhythm.     Heart sounds: Normal heart sounds. No murmur. No friction rub. No gallop.   Pulmonary:     Effort: Pulmonary effort is normal. No respiratory distress.     Breath sounds: Normal breath sounds. No wheezing or rales.  Abdominal:     General: Abdomen is flat. Bowel sounds are decreased. There is distension.     Palpations: Abdomen is soft.     Tenderness: There is abdominal tenderness. There is guarding.  Musculoskeletal:     Cervical back: Normal range of motion and neck supple.     Comments: Right lower extremity in hard cast from below knee to toes. Mild swelling in right toes. Cap refill normal.   Skin:    General: Skin is warm and dry.     Capillary Refill:  Capillary refill takes less than 2 seconds.  Neurological:     General: No focal deficit present.     Mental Status: She is alert. Mental status is at baseline.     Gait: Gait abnormal (NWB in wheelchair).  Psychiatric:        Mood and Affect: Mood normal.        Thought Content: Thought content normal.     CLINICAL DATA:  The patient has not had a bowel movement since right ankle surgery 06/23/2019.  EXAM: ABDOMEN - 2 VIEW  COMPARISON:  None.  FINDINGS: The bowel gas pattern is normal. There is no evidence of free air. Large stool burden throughout the colon noted. No radio-opaque calculi or other significant radiographic abnormality is seen. The patient has severe convex left thoracolumbar scoliosis.  IMPRESSION: No acute abnormality.  Large colonic stool burden.  Severe scoliosis.   Electronically Signed   By: Inge Rise M.D.   On: 07/02/2019 13:27  No results found for any visits on 07/02/19.  Assessment & Plan    1. Type I or II open fracture of right ankle with routine healing, subsequent encounter In hard cast. Doing home PT. Follows up with Orthopedics on 07/13/19.  2. Acute urinary retention Foley catheter in place. Urology referral placed for removal. Appt scheduled for 07/06/19 at 0915 and 1330. - Ambulatory referral to Urology - DG Abd 2 Views; Future  3. Slow transit constipation Worrisome for post-op ileus. Will get stat imaging as below. Will f/u pending results. If not appearing to be ileus and more associated with constipation will start stool softener and laxatives. If more concerning may have to refer to ER. Awaiting stat xray.  - DG Abd 2 Views; Future  4. Non-intractable vomiting with nausea, unspecified vomiting type See above medical treatment plan. - DG Abd 2 Views; Future  *Addend: Xray shows only constipation. Patient advised to try Miralax and stool softener. Push fluids. Zofran sent for nausea. Strict precautions of when to  go to ER were verbally given. She voiced understanding.  No follow-ups on file.      Reynolds Bowl, PA-C, have reviewed all documentation for this visit. The documentation on 07/02/19 for the exam, diagnosis, procedures, and orders are all accurate and complete.   Mar Daring, PA-C  [facility]  [phone] (phone) [phone] (fax)  [place]

## 2019-07-02 NOTE — Addendum Note (Signed)
Addended by: Mar Daring on: 07/02/2019 01:48 PM   Modules accepted: Orders

## 2019-07-05 ENCOUNTER — Telehealth: Payer: Self-pay

## 2019-07-05 NOTE — Telephone Encounter (Signed)
LMTCB- If patient calls back ok for the PEC to give patient message.

## 2019-07-05 NOTE — Telephone Encounter (Signed)
Copied from [place] [id number]. Topic: General - Inquiry >> Jul 05, 2019  1:02 PM Greggory Keen D wrote: Reason for CRM: Pt called asking if she can get a refill on the Oxycodone that she got from [facility] when she was in there for her broken foot 06/26/19  She uses [facility]  Pt's CB#  [phone]

## 2019-07-05 NOTE — Telephone Encounter (Signed)
She should contact her orthopedic doctor for that.  She already has a 30 day supply of the oxycodone-acetaminophen 7.5-325mg  that was given by me on 06/18/19.

## 2019-07-06 ENCOUNTER — Ambulatory Visit (INDEPENDENT_AMBULATORY_CARE_PROVIDER_SITE_OTHER): Payer: Medicare Other | Admitting: Physician Assistant

## 2019-07-06 ENCOUNTER — Encounter: Payer: Self-pay | Admitting: Physician Assistant

## 2019-07-06 ENCOUNTER — Other Ambulatory Visit: Payer: Self-pay

## 2019-07-06 ENCOUNTER — Ambulatory Visit: Payer: Medicare Other | Admitting: Physician Assistant

## 2019-07-06 VITALS — BP 113/72 | HR 69 | Ht 61.0 in | Wt 199.0 lb

## 2019-07-06 DIAGNOSIS — N9989 Other postprocedural complications and disorders of genitourinary system: Secondary | ICD-10-CM | POA: Diagnosis not present

## 2019-07-06 DIAGNOSIS — R338 Other retention of urine: Secondary | ICD-10-CM

## 2019-07-06 LAB — BLADDER SCAN AMB NON-IMAGING: Scan Result: 0

## 2019-07-06 NOTE — Progress Notes (Signed)
Fill and Pull Catheter Removal  Patient is present today for a catheter removal.  Patient was cleaned and prepped in a sterile fashion 167ml of sterile saline was instilled into the bladder when the patient felt the urge to urinate. 37ml of water was then drained from the balloon.  A [id number] silicone foley cath was removed from the bladder no complications were noted.  Patient immediately had a bladder spasm with efflux of a large volume of unmeasured urine upon removal; she no longer felt the need to urinate thereafter. Patient tolerated well.  Performed by: Debroah Loop, PA-C   Follow up/ Additional notes: Push fluids and RTC this afternoon for PVR.

## 2019-07-06 NOTE — Progress Notes (Signed)
07/06/2019 5:58 PM   Kathleen Buchanan 12/09/1965 [id number]  CC: Urinary retention  HPI: Kathleen Buchanan is a 54 y.o. female who presents today for voiding trial. She was hospitalized at [facility] from 06/22/2019 to 06/26/2019 after sustaining an open right ankle fracture-dislocation requiring irrigation and excisional debridement with excisional debridement of bone. She developed postoperative acute urinary retention requiring Foley catheter reinsertion on 06/24/2019 and was counseled to follow-up with urology for a voiding trial as an outpatient. She is a new patient in our practice and has not been seen previously.  Patient denies a history of urinary retention. She does report history of constipation and chronic low back pain as well as the sensation of incomplete bladder emptying. She does not believe she was seen by urology during her recent hospitalization. She states she has never seen a urologist before.  PMH: Past Medical History:  Diagnosis Date  . Asthma   . COPD (chronic obstructive pulmonary disease) ([place])     Surgical History: Past Surgical History:  Procedure Laterality Date  . ABLATION  2011  . BREAST REDUCTION SURGERY Bilateral 08/1991  . CESAREAN SECTION     X 2  . REDUCTION MAMMAPLASTY Bilateral 1990?  . TUBAL LIGATION  1999    Home Medications:  Allergies as of 07/06/2019      Reactions   Codeine Hives   Erythromycin    Hydrocodone-acetaminophen Hives   Lansoprazole    Levofloxacin    Migraine Formula  [aspirin-acetaminophen-caffeine]    Morphine Sulfate    Omeprazole    Pepcid  [famotidine]    Sulfa Antibiotics       Medication List       Accurate as of July 06, 2019  5:58 PM. If you have any questions, ask your nurse or doctor.        albuterol 108 (90 Base) MCG/ACT inhaler Commonly known as: VENTOLIN HFA INHALE 2 PUFFS INTO THE LUNGS EVERY 4 (FOUR) HOURS AS NEEDED FOR WHEEZING OR SHORTNESS OF BREATH.   ALPRAZolam 1 MG tablet Commonly  known as: XANAX Take 1 tablet (1 mg total) by mouth 2 (two) times daily.   amphetamine-dextroamphetamine 30 MG tablet Commonly known as: ADDERALL Take 1 tablet by mouth 2 (two) times daily.   ascorbic acid 500 MG tablet Commonly known as: VITAMIN C Take 1,000 mg by mouth daily.   aspirin 81 MG EC tablet Take by mouth.   baclofen 10 MG tablet Commonly known as: LIORESAL Take 1 tablet (10 mg total) by mouth 3 (three) times daily.   BC FAST PAIN RELIEF PO Take by mouth as needed.   cetirizine 10 MG tablet Commonly known as: ZYRTEC Take 10 mg by mouth daily.   cyclobenzaprine 5 MG tablet Commonly known as: FLEXERIL Take by mouth.   hydrochlorothiazide 12.5 MG tablet Commonly known as: HYDRODIURIL TAKE 1 TABLET BY MOUTH DAILY.   ibuprofen 200 MG tablet Commonly known as: ADVIL Take 4 tablets by mouth as needed.   multivitamin capsule Take 1 capsule by mouth daily.   ondansetron 4 MG tablet Commonly known as: ZOFRAN Take 1 tablet (4 mg total) by mouth every 8 (eight) hours as needed.   oxyCODONE-acetaminophen 7.5-325 MG tablet Commonly known as: Endocet Take 1 tablet by mouth 2 (two) times daily.   pantoprazole 40 MG tablet Commonly known as: PROTONIX TAKE 1 TABLET BY MOUTH EVERY DAY   polyethylene glycol powder 17 GM/SCOOP powder Commonly known as: GLYCOLAX/MIRALAX Take 17 g by mouth 2 (two)  times daily as needed.   senna-docusate 8.6-50 MG tablet Commonly known as: Senokot-S Take by mouth.       Allergies:  Allergies  Allergen Reactions  . Codeine Hives  . Erythromycin   . Hydrocodone-Acetaminophen Hives  . Lansoprazole   . Levofloxacin   . Migraine Formula  [Aspirin-Acetaminophen-Caffeine]   . Morphine Sulfate   . Omeprazole   . Pepcid  [Famotidine]   . Sulfa Antibiotics     Family History: Family History  Problem Relation Age of Onset  . Lung cancer Father   . Ulcers Father   . Cirrhosis Father   . Colon polyps Maternal Aunt   .  Ulcers Mother   . Colon polyps Mother   . Diabetes Mother   . Hyperlipidemia Mother   . Aneurysm Maternal Grandmother   . Throat cancer Maternal Grandfather     Social History:   reports that she has been smoking cigarettes. She has been smoking about 0.25 packs per day. She has never used smokeless tobacco. She reports that she does not drink alcohol or use drugs.  Physical Exam: BP 113/72   Pulse 69   Ht 5\' 1"  (1.549 m)   Wt 199 lb (90.3 kg)   BMI 37.60 kg/m   Constitutional:  Alert and oriented, no acute distress, nontoxic appearing HEENT: [place], AT Cardiovascular: No clubbing, cyanosis, or edema Respiratory: Normal respiratory effort, no increased work of breathing Skin: No rashes, bruises or suspicious lesions Neurologic: Grossly intact, no focal deficits, moving all 4 extremities Psychiatric: Normal mood and affect  Laboratory Data: Results for orders placed or performed in visit on 07/06/19  Bladder Scan (Post Void Residual) in office  Result Value Ref Range   Scan Result 0    Assessment & Plan:   1. Postoperative urinary retention 54 year old female with a history of chronic low back pain, constipation, and a sensation of incomplete voiding presents today for voiding trial in the setting of recent postoperative urinary retention.  We proceeded with fill and pull voiding trial in the morning, see separate procedure note for details. Patient return to clinic in the afternoon. She reported drinking approximately 16 ounces of fluid throughout the day. She had been able to urinate. PVR 0 mL. Voiding trial passed.  Patient wishes to establish care with a urologist given her history. I am in agreement with this plan. We will schedule her for 4-week symptom recheck with PVR with one of our MDs to establish care in our office. Counseled her to contact our office sooner if she develops lower abdominal pain and the inability to urinate. She expressed understanding. - Bladder Scan  (Post Void Residual) in office   Return in about 4 weeks (around 08/03/2019) for Establish care/symptom recheck/PVR.  Kathleen Loop, PA-C  [facility] [address] [place], [place] [address] [phone]

## 2019-07-10 ENCOUNTER — Other Ambulatory Visit: Payer: Self-pay | Admitting: Physician Assistant

## 2019-07-10 DIAGNOSIS — J4520 Mild intermittent asthma, uncomplicated: Secondary | ICD-10-CM

## 2019-07-10 NOTE — Telephone Encounter (Signed)
Requested Prescriptions  Pending Prescriptions Disp Refills  . albuterol (VENTOLIN HFA) 108 (90 Base) MCG/ACT inhaler [Pharmacy Med Name: ALBUTEROL HFA (PROAIR) INHALER] 8.5 g 0    Sig: INHALE 2 PUFFS INTO THE LUNGS EVERY 4 (FOUR) HOURS AS NEEDED FOR WHEEZING OR SHORTNESS OF BREATH.     Pulmonology:  Beta Agonists Failed - 07/10/2019  9:29 AM      Failed - One inhaler should last at least one month. If the patient is requesting refills earlier, contact the patient to check for uncontrolled symptoms.      Passed - Valid encounter within last 12 months    Recent Outpatient Visits          1 week ago Type I or II open fracture of right ankle with routine healing, subsequent encounter   [place], [place]   5 months ago Acute pansinusitis, recurrence not specified   [place], Vickki Muff, [place]   1 year ago Acute pansinusitis, recurrence not specified   [place], Clearnce Sorrel, [place]   1 year ago Chronic narcotic use   [place], [place], [place]   2 years ago Acute pansinusitis, recurrence not specified   [facility], Clearnce Sorrel, [place]      Future Appointments            In 2 weeks Hollice Espy, MD [place]

## 2019-07-15 DIAGNOSIS — S82891D Other fracture of right lower leg, subsequent encounter for closed fracture with routine healing: Secondary | ICD-10-CM | POA: Diagnosis not present

## 2019-07-15 DIAGNOSIS — Z9889 Other specified postprocedural states: Secondary | ICD-10-CM | POA: Diagnosis not present

## 2019-07-25 DIAGNOSIS — S82891B Other fracture of right lower leg, initial encounter for open fracture type I or II: Secondary | ICD-10-CM | POA: Diagnosis not present

## 2019-07-27 NOTE — Progress Notes (Incomplete)
07/27/19 9:23 PM   Kathleen Buchanan May 16, 1965 [id number]  Referring provider: Mar Daring, PA-C [place],  [place] [address] No chief complaint on file.   HPI: Kathleen Buchanan is a 55 y.o. F who presents today for voiding trial.  She was hospitalized at [facility] from 06/22/2019 to 06/26/2019 after sustaining an open right ankle fracture-dislocation requiring irrigation and excisional debridement with excisional debridement of bone. She developed postoperative acute urinary retention requiring Foley catheter reinsertion on 06/24/2019 and was counseled to follow-up with urology for a voiding trial as an outpatient.  Patient denies a history of urinary retention. She does report history of constipation and chronic low back pain as well as the sensation of incomplete bladder emptying. She does not believe she was seen by urology during her recent hospitalization. She states she has never seen a urologist before.    PMH: Past Medical History:  Diagnosis Date  . Asthma   . COPD (chronic obstructive pulmonary disease) ([place])     Surgical History: Past Surgical History:  Procedure Laterality Date  . ABLATION  2011  . BREAST REDUCTION SURGERY Bilateral 08/1991  . CESAREAN SECTION     X 2  . REDUCTION MAMMAPLASTY Bilateral 1990?  . TUBAL LIGATION  1999    Home Medications:  Allergies as of 07/28/2019      Reactions   Codeine Hives   Erythromycin    Hydrocodone-acetaminophen Hives   Lansoprazole    Levofloxacin    Migraine Formula  [aspirin-acetaminophen-caffeine]    Morphine Sulfate    Omeprazole    Pepcid  [famotidine]    Sulfa Antibiotics       Medication List       Accurate as of Jul 27, 2019  9:23 PM. If you have any questions, ask your nurse or doctor.        albuterol 108 (90 Base) MCG/ACT inhaler Commonly known as: VENTOLIN HFA INHALE 2 PUFFS INTO THE LUNGS EVERY 4 (FOUR) HOURS AS NEEDED FOR WHEEZING OR SHORTNESS OF BREATH.     ALPRAZolam 1 MG tablet Commonly known as: XANAX Take 1 tablet (1 mg total) by mouth 2 (two) times daily.   amphetamine-dextroamphetamine 30 MG tablet Commonly known as: ADDERALL Take 1 tablet by mouth 2 (two) times daily.   ascorbic acid 500 MG tablet Commonly known as: VITAMIN C Take 1,000 mg by mouth daily.   aspirin 81 MG EC tablet Take by mouth.   baclofen 10 MG tablet Commonly known as: LIORESAL Take 1 tablet (10 mg total) by mouth 3 (three) times daily.   BC FAST PAIN RELIEF PO Take by mouth as needed.   cetirizine 10 MG tablet Commonly known as: ZYRTEC Take 10 mg by mouth daily.   hydrochlorothiazide 12.5 MG tablet Commonly known as: HYDRODIURIL TAKE 1 TABLET BY MOUTH DAILY.   ibuprofen 200 MG tablet Commonly known as: ADVIL Take 4 tablets by mouth as needed.   multivitamin capsule Take 1 capsule by mouth daily.   ondansetron 4 MG tablet Commonly known as: ZOFRAN Take 1 tablet (4 mg total) by mouth every 8 (eight) hours as needed.   oxyCODONE-acetaminophen 7.5-325 MG tablet Commonly known as: Endocet Take 1 tablet by mouth 2 (two) times daily.   pantoprazole 40 MG tablet Commonly known as: PROTONIX TAKE 1 TABLET BY MOUTH EVERY DAY   polyethylene glycol powder 17 GM/SCOOP powder Commonly known as: GLYCOLAX/MIRALAX Take 17 g by mouth 2 (two) times daily as needed.  Allergies:  Allergies  Allergen Reactions  . Codeine Hives  . Erythromycin   . Hydrocodone-Acetaminophen Hives  . Lansoprazole   . Levofloxacin   . Migraine Formula  [Aspirin-Acetaminophen-Caffeine]   . Morphine Sulfate   . Omeprazole   . Pepcid  [Famotidine]   . Sulfa Antibiotics     Family History: Family History  Problem Relation Age of Onset  . Lung cancer Father   . Ulcers Father   . Cirrhosis Father   . Colon polyps Maternal Aunt   . Ulcers Mother   . Colon polyps Mother   . Diabetes Mother   . Hyperlipidemia Mother   . Aneurysm Maternal Grandmother   .  Throat cancer Maternal Grandfather     Social History:  reports that she has been smoking cigarettes. She has been smoking about 0.25 packs per day. She has never used smokeless tobacco. She reports that she does not drink alcohol or use drugs.   Physical Exam: There were no vitals taken for this visit.  Constitutional:  Alert and oriented, No acute distress. HEENT: [place] AT, moist mucus membranes.  Trachea midline, no masses. Cardiovascular: No clubbing, cyanosis, or edema. Respiratory: Normal respiratory effort, no increased work of breathing. GI: Abdomen is soft, nontender, nondistended, no abdominal masses GU: No CVA tenderness Lymph: No cervical or inguinal lymphadenopathy. Skin: No rashes, bruises or suspicious lesions. Neurologic: Grossly intact, no focal deficits, moving all 4 extremities. Psychiatric: Normal mood and affect.  Laboratory Data:   Urinalysis   Pertinent Imaging:  Assessment & Plan:     No follow-ups on file.  [facility] [address], [place],  [address] [phone]  I, Kathleen Buchanan, am acting as a scribe for Dr. Hollice Espy,  {Add Scribe Attestation Statement}

## 2019-07-28 ENCOUNTER — Encounter: Payer: Self-pay | Admitting: Urology

## 2019-07-28 ENCOUNTER — Ambulatory Visit: Payer: Self-pay | Admitting: Urology

## 2019-08-03 ENCOUNTER — Other Ambulatory Visit: Payer: Self-pay | Admitting: Physician Assistant

## 2019-08-03 DIAGNOSIS — M859 Disorder of bone density and structure, unspecified: Secondary | ICD-10-CM | POA: Diagnosis not present

## 2019-08-03 DIAGNOSIS — M778 Other enthesopathies, not elsewhere classified: Secondary | ICD-10-CM

## 2019-08-03 DIAGNOSIS — Z780 Asymptomatic menopausal state: Secondary | ICD-10-CM | POA: Diagnosis not present

## 2019-08-03 DIAGNOSIS — Z885 Allergy status to narcotic agent status: Secondary | ICD-10-CM | POA: Diagnosis not present

## 2019-08-03 DIAGNOSIS — Z13820 Encounter for screening for osteoporosis: Secondary | ICD-10-CM | POA: Insufficient documentation

## 2019-08-03 DIAGNOSIS — S82891E Other fracture of right lower leg, subsequent encounter for open fracture type I or II with routine healing: Secondary | ICD-10-CM | POA: Diagnosis not present

## 2019-08-03 DIAGNOSIS — F900 Attention-deficit hyperactivity disorder, predominantly inattentive type: Secondary | ICD-10-CM

## 2019-08-03 DIAGNOSIS — Z881 Allergy status to other antibiotic agents status: Secondary | ICD-10-CM | POA: Diagnosis not present

## 2019-08-03 DIAGNOSIS — K219 Gastro-esophageal reflux disease without esophagitis: Secondary | ICD-10-CM | POA: Diagnosis not present

## 2019-08-03 DIAGNOSIS — Z882 Allergy status to sulfonamides status: Secondary | ICD-10-CM | POA: Diagnosis not present

## 2019-08-03 MED ORDER — OXYCODONE-ACETAMINOPHEN 7.5-325 MG PO TABS: 1.0000 | ORAL_TABLET | Freq: Two times a day (BID) | ORAL | 0 refills | Status: DC

## 2019-08-03 MED ORDER — AMPHETAMINE-DEXTROAMPHETAMINE 30 MG PO TABS: 1.0000 | ORAL_TABLET | Freq: Two times a day (BID) | ORAL | 0 refills | Status: DC

## 2019-08-03 NOTE — Telephone Encounter (Signed)
oxyCODONE-acetaminophen (ENDOCET) 7.5-325 MG tablet  amphetamine-dextroamphetamine (ADDERALL) 30 MG tablet      Patient is requesting refill.    [facility]  [id number] [place], [place] Phone:  [phone]  Fax:  [phone]

## 2019-08-25 DIAGNOSIS — S82891B Other fracture of right lower leg, initial encounter for open fracture type I or II: Secondary | ICD-10-CM | POA: Diagnosis not present

## 2019-08-30 ENCOUNTER — Other Ambulatory Visit: Payer: Self-pay | Admitting: Physician Assistant

## 2019-08-30 DIAGNOSIS — M778 Other enthesopathies, not elsewhere classified: Secondary | ICD-10-CM

## 2019-08-30 DIAGNOSIS — F900 Attention-deficit hyperactivity disorder, predominantly inattentive type: Secondary | ICD-10-CM

## 2019-08-30 NOTE — Telephone Encounter (Signed)
Medication Refill - Medication: adderall, oxycodone  Has the patient contacted their pharmacy? Yes.   (Agent: If no, request that the patient contact the pharmacy for the refill.) (Agent: If yes, when and what did the pharmacy advise?)  Preferred Pharmacy (with phone number or street name):  [id number] [place], [place]  [place] [address]  Phone: [phone] Fax: [phone]  Hours: Not open 24 hours     Agent: Please be advised that RX refills may take up to 3 business days. We ask that you follow-up with your pharmacy.

## 2019-08-30 NOTE — Telephone Encounter (Signed)
Requested medication (s) are due for refill today: Adderall    Requested medication (s) are on the active medication list: yes  Last refill: 08/03/19  Future visit scheduled: no  Notes to clinic: not delegated  Requested medication (s) are due for refill today:  Oxycodone  Requested medication (s) are on the active medication list: yes  Last refill: 08/03/19  Future visit scheduled: no  Notes to clinic: not delegated   Requested Prescriptions  Pending Prescriptions Disp Refills   oxyCODONE-acetaminophen (ENDOCET) 7.5-325 MG tablet 60 tablet 0    Sig: Take 1 tablet by mouth 2 (two) times daily.      Not Delegated - Analgesics:  Opioid Agonist Combinations Failed - 08/30/2019  1:58 PM      Failed - This refill cannot be delegated      Failed - Urine Drug Screen completed in last 360 days.      Passed - Valid encounter within last 6 months    Recent Outpatient Visits           1 month ago Type I or II open fracture of right ankle with routine healing, subsequent encounter   [facility] [place], [place]   7 months ago Acute pansinusitis, recurrence not specified   [place], Vickki Muff, [place]   1 year ago Acute pansinusitis, recurrence not specified   [facility], Clearnce Sorrel, [place]   1 year ago Chronic narcotic use   [facility] [place], [place], [place]   2 years ago Acute pansinusitis, recurrence not specified   [facility], [place], [place]                amphetamine-dextroamphetamine (ADDERALL) 30 MG tablet 60 tablet 0    Sig: Take 1 tablet by mouth 2 (two) times daily.      Not Delegated - Psychiatry:  Stimulants/ADHD Failed - 08/30/2019  1:58 PM      Failed - This refill cannot be delegated      Failed - Urine Drug Screen completed in last 360 days.      Passed - Valid encounter within last 3 months    Recent Outpatient Visits           1 month  ago Type I or II open fracture of right ankle with routine healing, subsequent encounter   [place], [place]   7 months ago Acute pansinusitis, recurrence not specified   [place], Vickki Muff, [place]   1 year ago Acute pansinusitis, recurrence not specified   [place], Clearnce Sorrel, [place]   1 year ago Chronic narcotic use   [place], [place], [place]   2 years ago Acute pansinusitis, recurrence not specified   [facility], [place], [place]

## 2019-09-01 MED ORDER — OXYCODONE-ACETAMINOPHEN 7.5-325 MG PO TABS: 1.0000 | ORAL_TABLET | Freq: Two times a day (BID) | ORAL | 0 refills | Status: DC

## 2019-09-01 MED ORDER — AMPHETAMINE-DEXTROAMPHETAMINE 30 MG PO TABS: 1.0000 | ORAL_TABLET | Freq: Two times a day (BID) | ORAL | 0 refills | Status: DC

## 2019-09-08 ENCOUNTER — Telehealth: Payer: Self-pay

## 2019-09-08 DIAGNOSIS — J0140 Acute pansinusitis, unspecified: Secondary | ICD-10-CM

## 2019-09-08 MED ORDER — AMOXICILLIN-POT CLAVULANATE 875-125 MG PO TABS: 1.0000 | ORAL_TABLET | Freq: Two times a day (BID) | ORAL | 0 refills | Status: DC

## 2019-09-08 NOTE — Telephone Encounter (Signed)
Copied from [place] [id number]. Topic: General - Inquiry >> Sep 08, 2019 10:51 AM Percell Belt A wrote: Reason for CRM: pt called in stated that she has a sinus infection for about a week.  She wanted to know if Tawanna Sat would all in meds for her. I offer pt appt and she stated that she gets so many Tawanna Sat normally will just call it in for her.  Please advise  Best number [phone] [facility] [place]

## 2019-09-08 NOTE — Telephone Encounter (Signed)
Augmentin sent in since we are short staffed and have no available appts until next week. Will need to be seen, virtual would be ok, if symptoms do not improve and resolve

## 2019-09-08 NOTE — Telephone Encounter (Signed)
Left message advising pt.   Thanks,   -Zeek Rostron  

## 2019-09-09 DIAGNOSIS — S92141D Displaced dome fracture of right talus, subsequent encounter for fracture with routine healing: Secondary | ICD-10-CM | POA: Diagnosis not present

## 2019-09-09 DIAGNOSIS — S82841D Displaced bimalleolar fracture of right lower leg, subsequent encounter for closed fracture with routine healing: Secondary | ICD-10-CM | POA: Diagnosis not present

## 2019-09-09 DIAGNOSIS — S8251XD Displaced fracture of medial malleolus of right tibia, subsequent encounter for closed fracture with routine healing: Secondary | ICD-10-CM | POA: Diagnosis not present

## 2019-09-09 DIAGNOSIS — S8261XD Displaced fracture of lateral malleolus of right fibula, subsequent encounter for closed fracture with routine healing: Secondary | ICD-10-CM | POA: Diagnosis not present

## 2019-09-09 DIAGNOSIS — M85871 Other specified disorders of bone density and structure, right ankle and foot: Secondary | ICD-10-CM | POA: Diagnosis not present

## 2019-09-09 DIAGNOSIS — S92131D Displaced fracture of posterior process of right talus, subsequent encounter for fracture with routine healing: Secondary | ICD-10-CM | POA: Diagnosis not present

## 2019-09-09 DIAGNOSIS — S82891E Other fracture of right lower leg, subsequent encounter for open fracture type I or II with routine healing: Secondary | ICD-10-CM | POA: Diagnosis not present

## 2019-09-24 DIAGNOSIS — S82891B Other fracture of right lower leg, initial encounter for open fracture type I or II: Secondary | ICD-10-CM | POA: Diagnosis not present

## 2019-10-01 ENCOUNTER — Other Ambulatory Visit: Payer: Self-pay | Admitting: Physician Assistant

## 2019-10-01 DIAGNOSIS — F900 Attention-deficit hyperactivity disorder, predominantly inattentive type: Secondary | ICD-10-CM

## 2019-10-01 DIAGNOSIS — M778 Other enthesopathies, not elsewhere classified: Secondary | ICD-10-CM

## 2019-10-01 MED ORDER — OXYCODONE-ACETAMINOPHEN 7.5-325 MG PO TABS: 1.0000 | ORAL_TABLET | Freq: Two times a day (BID) | ORAL | 0 refills | Status: DC

## 2019-10-01 MED ORDER — AMPHETAMINE-DEXTROAMPHETAMINE 30 MG PO TABS: 1.0000 | ORAL_TABLET | Freq: Two times a day (BID) | ORAL | 0 refills | Status: DC

## 2019-10-01 NOTE — Telephone Encounter (Signed)
Pt request refill  amphetamine-dextroamphetamine (ADDERALL) 30 MG tablet  oxyCODONE-acetaminophen (ENDOCET) 7.5-325 MG tablet   [id number] [place] [place] Phone:  [phone]  Fax:  [phone]

## 2019-10-01 NOTE — Telephone Encounter (Signed)
Requested medication (s) are due for refill today-yes  Requested medication (s) are on the active medication list -yes  Future visit scheduled -no  Last refill: 09/01/19  Notes to clinic: Request for non delegated Rx  Requested Prescriptions  Pending Prescriptions Disp Refills   amphetamine-dextroamphetamine (ADDERALL) 30 MG tablet 60 tablet 0    Sig: Take 1 tablet by mouth 2 (two) times daily.      Not Delegated - Psychiatry:  Stimulants/ADHD Failed - 10/01/2019 11:50 AM      Failed - This refill cannot be delegated      Failed - Urine Drug Screen completed in last 360 days.      Failed - Valid encounter within last 3 months    Recent Outpatient Visits           3 months ago Type I or II open fracture of right ankle with routine healing, subsequent encounter   [facility], [place], [place]   8 months ago Acute pansinusitis, recurrence not specified   [place], Vickki Muff, [place]   1 year ago Acute pansinusitis, recurrence not specified   [facility] [place], Clearnce Sorrel, [place]   1 year ago Chronic narcotic use   [facility] [place], [place], [place]   2 years ago Acute pansinusitis, recurrence not specified   [facility] Fenton Malling M, [place]                oxyCODONE-acetaminophen (ENDOCET) 7.5-325 MG tablet 60 tablet 0    Sig: Take 1 tablet by mouth 2 (two) times daily.      Not Delegated - Analgesics:  Opioid Agonist Combinations Failed - 10/01/2019 11:50 AM      Failed - This refill cannot be delegated      Failed - Urine Drug Screen completed in last 360 days.      Passed - Valid encounter within last 6 months    Recent Outpatient Visits           3 months ago Type I or II open fracture of right ankle with routine healing, subsequent encounter   [facility] F[place] Malling [place], [place]   8 months ago Acute pansinusitis, recurrence not specified    [place], Vickki Muff, [place]   1 year ago Acute pansinusitis, recurrence not specified   [facility] [place], Clearnce Sorrel, [place]   1 year ago Chronic narcotic use   [facility] [place], [place], [place]   2 years ago Acute pansinusitis, recurrence not specified   [place], [place], [place]                  Requested Prescriptions  Pending Prescriptions Disp Refills   amphetamine-dextroamphetamine (ADDERALL) 30 MG tablet 60 tablet 0    Sig: Take 1 tablet by mouth 2 (two) times daily.      Not Delegated - Psychiatry:  Stimulants/ADHD Failed - 10/01/2019 11:50 AM      Failed - This refill cannot be delegated      Failed - Urine Drug Screen completed in last 360 days.      Failed - Valid encounter within last 3 months    Recent Outpatient Visits           3 months ago Type I or II open fracture of right ankle with routine healing, subsequent encounter   [place], [place]   8 months  ago Acute pansinusitis, recurrence not specified   [place], Vickki Muff, [place]   1 year ago Acute pansinusitis, recurrence not specified   [facility] [place], Clearnce Sorrel, [place]   1 year ago Chronic narcotic use   [facility] [place] Malling M, [place]   2 years ago Acute pansinusitis, recurrence not specified   [facility] Fenton Malling M, [place]                oxyCODONE-acetaminophen (ENDOCET) 7.5-325 MG tablet 60 tablet 0    Sig: Take 1 tablet by mouth 2 (two) times daily.      Not Delegated - Analgesics:  Opioid Agonist Combinations Failed - 10/01/2019 11:50 AM      Failed - This refill cannot be delegated      Failed - Urine Drug Screen completed in last 360 days.      Passed - Valid encounter within last 6 months    Recent Outpatient Visits           3 months ago Type I or II open fracture of  right ankle with routine healing, subsequent encounter   [place], [place]   8 months ago Acute pansinusitis, recurrence not specified   [place], Vickki Muff, [place]   1 year ago Acute pansinusitis, recurrence not specified   [place], Clearnce Sorrel, [place]   1 year ago Chronic narcotic use   [place], [place], [place]   2 years ago Acute pansinusitis, recurrence not specified   [facility], [place], [place]

## 2019-10-25 DIAGNOSIS — S82891B Other fracture of right lower leg, initial encounter for open fracture type I or II: Secondary | ICD-10-CM | POA: Diagnosis not present

## 2019-11-01 ENCOUNTER — Telehealth: Payer: Self-pay | Admitting: Physician Assistant

## 2019-11-02 ENCOUNTER — Telehealth: Payer: Self-pay

## 2019-11-02 DIAGNOSIS — F900 Attention-deficit hyperactivity disorder, predominantly inattentive type: Secondary | ICD-10-CM

## 2019-11-02 DIAGNOSIS — M778 Other enthesopathies, not elsewhere classified: Secondary | ICD-10-CM

## 2019-11-02 MED ORDER — AMPHETAMINE-DEXTROAMPHETAMINE 30 MG PO TABS: 1.0000 | ORAL_TABLET | Freq: Two times a day (BID) | ORAL | 0 refills | Status: DC

## 2019-11-02 MED ORDER — OXYCODONE-ACETAMINOPHEN 7.5-325 MG PO TABS: 1.0000 | ORAL_TABLET | Freq: Two times a day (BID) | ORAL | 0 refills | Status: DC

## 2019-11-02 NOTE — Telephone Encounter (Signed)
Copied from [place] [id number]. Topic: Quick Communication - See Telephone Encounter >> Nov 01, 2019  4:54 PM Loma [place] wrote: CRM for notification. See Telephone encounter for: 11/01/19.oxyCODONE-acetaminophen (ENDOCET) 7.5-325 MG tablet Medication Date: 10/01/2019 Department: [facility] Ordering/Authorizing: Mar Daring, PA-C amphetamine-dextroamphetamine (ADDERALL) 30 MG tablet Medication Date: 10/01/2019 Department: [facility] Ordering/Authorizing: Mar Daring, [place]- [id number] [place], [place]  Phone:  [phone] Fax:  [phone] Pt is requesting these (2) meds . She is in a cast or she would make a med refill appt. Please FU if there is an issue with refill

## 2019-11-02 NOTE — Telephone Encounter (Signed)
Refilled medications

## 2019-11-02 NOTE — Addendum Note (Signed)
Addended by: Mar Daring on: 11/02/2019 10:35 AM   Modules accepted: Orders

## 2019-11-11 DIAGNOSIS — S92191D Other fracture of right talus, subsequent encounter for fracture with routine healing: Secondary | ICD-10-CM | POA: Diagnosis not present

## 2019-11-11 DIAGNOSIS — S92101D Unspecified fracture of right talus, subsequent encounter for fracture with routine healing: Secondary | ICD-10-CM | POA: Diagnosis not present

## 2019-11-11 DIAGNOSIS — S82891E Other fracture of right lower leg, subsequent encounter for open fracture type I or II with routine healing: Secondary | ICD-10-CM | POA: Diagnosis not present

## 2019-11-11 DIAGNOSIS — S82841E Displaced bimalleolar fracture of right lower leg, subsequent encounter for open fracture type I or II with routine healing: Secondary | ICD-10-CM | POA: Diagnosis not present

## 2019-11-11 DIAGNOSIS — S82841D Displaced bimalleolar fracture of right lower leg, subsequent encounter for closed fracture with routine healing: Secondary | ICD-10-CM | POA: Diagnosis not present

## 2019-11-23 NOTE — Progress Notes (Signed)
MyChart Video Visit    Virtual Visit via Video Note   This visit type was conducted due to national recommendations for restrictions regarding the COVID-19 Pandemic (e.g. social distancing) in an effort to limit this patient's exposure and mitigate transmission in our community. This patient is at least at moderate risk for complications without adequate follow up. This format is felt to be most appropriate for this patient at this time. Physical exam was limited by quality of the video and audio technology used for the visit.   I connected with Leamon Arnt on 11/24/19 at 10:20 AM EDT by a video enabled telemedicine application and verified that I am speaking with the correct person using two identifiers.  I discussed the limitations of evaluation and management by telemedicine and the availability of in person appointments. The patient expressed understanding and agreed to proceed.  Patient location: Home Provider location: [facility]  I discussed the limitations of evaluation and management by telemedicine and the availability of in person appointments. The patient expressed understanding and agreed to proceed.  Patient: Kathleen Buchanan   DOB: [birth year]   54 y.o. Female  MRN: [id number] Visit Date: 11/24/2019  Today's healthcare provider: Mar Daring, PA-C   Chief Complaint  Patient presents with  . URI   Subjective    URI  This is a new problem. The current episode started in the past 7 days (started on Sunday). The problem has been gradually worsening. There has been no fever. Associated symptoms include congestion, coughing, headaches, a plugged ear sensation and sinus pain. Pertinent negatives include no abdominal pain, chest pain, diarrhea, nausea, rhinorrhea, sneezing, sore throat, vomiting or wheezing. She has tried inhaler use and antihistamine for the symptoms. The treatment provided no relief.    No covid-19 vaccine and is not thinking on getting them.  Patient  Active Problem List   Diagnosis Date Noted  . Menopausal syndrome (hot flashes) 08/13/2016  . Tobacco dependence 08/13/2016  . Family history of diabetes mellitus in mother 07/10/2016  . Chronic low back pain 11/08/2014  . Onychomycosis 09/15/2014  . ADD (attention deficit disorder) 09/08/2014  . Ankle pain 09/08/2014  . Ache in joint 09/08/2014  . Anxiety 09/08/2014  . Airway hyperreactivity 09/08/2014  . Ankle fracture 09/08/2014  . Back ache 09/08/2014  . Bursitis of hip 09/08/2014  . Chronic anemia 09/08/2014  . Chronic constipation 09/08/2014  . CAFL (chronic airflow limitation) ([place]) 09/08/2014  . Accumulation of fluid in tissues 09/08/2014  . Biliary sludge 09/08/2014  . Acid reflux 09/08/2014  . History of migraine headaches 09/08/2014  . Hypercholesteremia 09/08/2014  . Adiposity 09/08/2014  . Episodic paroxysmal anxiety disorder 09/08/2014  . Scoliosis 09/08/2014  . Tendinitis of wrist 09/08/2014  . Big thyroid 09/08/2014  . Current tobacco use 09/08/2014   Past Medical History:  Diagnosis Date  . Asthma   . COPD (chronic obstructive pulmonary disease) ([facility])       Medications: Outpatient Medications Prior to Visit  Medication Sig  . albuterol (VENTOLIN HFA) 108 (90 Base) MCG/ACT inhaler INHALE 2 PUFFS INTO THE LUNGS EVERY 4 (FOUR) HOURS AS NEEDED FOR WHEEZING OR SHORTNESS OF BREATH.  Marland Kitchen ALPRAZolam (XANAX) 1 MG tablet Take 1 tablet (1 mg total) by mouth 2 (two) times daily.  Marland Kitchen amphetamine-dextroamphetamine (ADDERALL) 30 MG tablet Take 1 tablet by mouth 2 (two) times daily.  Marland Kitchen ascorbic acid (VITAMIN C) 500 MG tablet Take 1,000 mg by mouth daily.   . cetirizine (ZYRTEC) 10  MG tablet Take 10 mg by mouth daily.  Marland Kitchen ibuprofen (ADVIL,MOTRIN) 200 MG tablet Take 4 tablets by mouth as needed.  . Multiple Vitamin (MULTIVITAMIN) capsule Take 1 capsule by mouth daily.  Marland Kitchen oxyCODONE-acetaminophen (ENDOCET) 7.5-325 MG tablet Take 1 tablet by mouth 2 (two) times daily.  .  pantoprazole (PROTONIX) 40 MG tablet TAKE 1 TABLET BY MOUTH EVERY DAY  . polyethylene glycol powder (GLYCOLAX/MIRALAX) powder Take 17 g by mouth 2 (two) times daily as needed.  Marland Kitchen amoxicillin-clavulanate (AUGMENTIN) 875-125 MG tablet Take 1 tablet by mouth 2 (two) times daily.  . Aspirin-Caffeine (BC FAST PAIN RELIEF PO) Take by mouth as needed. (Patient not taking: Reported on 11/24/2019)  . baclofen (LIORESAL) 10 MG tablet Take 1 tablet (10 mg total) by mouth 3 (three) times daily. (Patient not taking: Reported on 07/02/2019)  . hydrochlorothiazide (HYDRODIURIL) 12.5 MG tablet TAKE 1 TABLET BY MOUTH DAILY. (Patient not taking: Reported on 07/02/2019)  . ondansetron (ZOFRAN) 4 MG tablet Take 1 tablet (4 mg total) by mouth every 8 (eight) hours as needed. (Patient not taking: Reported on 11/24/2019)   No facility-administered medications prior to visit.    Review of Systems  Constitutional: Negative for appetite change, chills and fatigue.  HENT: Positive for congestion, postnasal drip, sinus pressure and sinus pain. Negative for rhinorrhea, sneezing and sore throat.   Respiratory: Positive for cough. Negative for chest tightness, shortness of breath and wheezing.   Cardiovascular: Negative for chest pain, palpitations and leg swelling.  Gastrointestinal: Negative for abdominal pain, diarrhea, nausea and vomiting.  Neurological: Positive for headaches.    Last CBC Lab Results  Component Value Date   WBC 5.6 01/16/2018   HGB 14.6 01/16/2018   HCT 43.2 01/16/2018   MCV 94 01/16/2018   MCH 31.7 01/16/2018   RDW 13.6 01/16/2018   PLT 174 93/81/8299   Last metabolic panel Lab Results  Component Value Date   GLUCOSE 76 01/16/2018   NA 139 01/16/2018   K 4.0 01/16/2018   CL 101 01/16/2018   CO2 25 01/16/2018   BUN 17 01/16/2018   CREATININE 0.82 01/16/2018   GFRNONAA 83 01/16/2018   GFRAA 95 01/16/2018   CALCIUM 9.2 01/16/2018   PROT 6.8 01/16/2018   ALBUMIN 4.4 01/16/2018   LABGLOB  2.4 01/16/2018   AGRATIO 1.8 01/16/2018   BILITOT 0.3 01/16/2018   ALKPHOS 99 01/16/2018   AST 21 01/16/2018   ALT 19 01/16/2018      Objective    There were no vitals taken for this visit. BP Readings from Last 3 Encounters:  07/06/19 113/72  07/02/19 105/69  01/16/18 (!) 96/59   Wt Readings from Last 3 Encounters:  07/06/19 199 lb (90.3 kg)  02/12/17 208 lb (94.3 kg)  11/08/16 208 lb 12.8 oz (94.7 kg)      Physical Exam     Assessment & Plan     1. Acute non-recurrent pansinusitis Worsening symptoms that have not responded to OTC medications. Will give augmentin as below. Continue allergy medications. Discussed flonase sensimist. Stay well hydrated and get plenty of rest. Call if no symptom improvement or if symptoms worsen. - amoxicillin-clavulanate (AUGMENTIN) 875-125 MG tablet; Take 1 tablet by mouth 2 (two) times daily.  Dispense: 30 tablet; Refill: 0  2. Hair loss Discussed stress hair loss from surgery. Discussed starting Biotin hair and nails and can consider Minoxidil (rogaine) OTC for the hair loss.   3. Antibiotic-induced yeast infection Gets yeast infections with antibiotics. Diflucan given  as below.  - fluconazole (DIFLUCAN) 150 MG tablet; Take 1 tablet (150 mg total) by mouth once for 1 dose. May repeat in 48-72 hrs if needed  Dispense: 2 tablet; Refill: 0  No follow-ups on file.     I discussed the assessment and treatment plan with the patient. The patient was provided an opportunity to ask questions and all were answered. The patient agreed with the plan and demonstrated an understanding of the instructions.   The patient was advised to call back or seek an in-person evaluation if the symptoms worsen or if the condition fails to improve as anticipated.  I provided 16 minutes of non-face-to-face time during this encounter.  Reynolds Bowl, PA-C, have reviewed all documentation for this visit. The documentation on 11/24/19 for the exam,  diagnosis, procedures, and orders are all accurate and complete.  [place] [facility] [phone] (phone) [phone] (fax)  [place]

## 2019-11-24 ENCOUNTER — Encounter: Payer: Self-pay | Admitting: Physician Assistant

## 2019-11-24 ENCOUNTER — Telehealth (INDEPENDENT_AMBULATORY_CARE_PROVIDER_SITE_OTHER): Payer: Medicare Other | Admitting: Physician Assistant

## 2019-11-24 DIAGNOSIS — J0140 Acute pansinusitis, unspecified: Secondary | ICD-10-CM

## 2019-11-24 DIAGNOSIS — T3695XA Adverse effect of unspecified systemic antibiotic, initial encounter: Secondary | ICD-10-CM

## 2019-11-24 DIAGNOSIS — B379 Candidiasis, unspecified: Secondary | ICD-10-CM | POA: Diagnosis not present

## 2019-11-24 DIAGNOSIS — L659 Nonscarring hair loss, unspecified: Secondary | ICD-10-CM

## 2019-11-24 MED ORDER — AMOXICILLIN-POT CLAVULANATE 875-125 MG PO TABS: 1.0000 | ORAL_TABLET | Freq: Two times a day (BID) | ORAL | 0 refills | Status: DC

## 2019-11-24 MED ORDER — FLUCONAZOLE 150 MG PO TABS: 150.0000 mg | ORAL_TABLET | Freq: Once | ORAL | 0 refills | Status: AC

## 2019-11-24 NOTE — Patient Instructions (Signed)
Sinusitis, Adult Sinusitis is inflammation of your sinuses. Sinuses are hollow spaces in the bones around your face. Your sinuses are located:  Around your eyes.  In the middle of your forehead.  Behind your nose.  In your cheekbones. Mucus normally drains out of your sinuses. When your nasal tissues become inflamed or swollen, mucus can become trapped or blocked. This allows bacteria, viruses, and fungi to grow, which leads to infection. Most infections of the sinuses are caused by a virus. Sinusitis can develop quickly. It can last for up to 4 weeks (acute) or for more than 12 weeks (chronic). Sinusitis often develops after a cold. What are the causes? This condition is caused by anything that creates swelling in the sinuses or stops mucus from draining. This includes:  Allergies.  Asthma.  Infection from bacteria or viruses.  Deformities or blockages in your nose or sinuses.  Abnormal growths in the nose (nasal polyps).  Pollutants, such as chemicals or irritants in the air.  Infection from fungi (rare). What increases the risk? You are more likely to develop this condition if you:  Have a weak body defense system (immune system).  Do a lot of swimming or diving.  Overuse nasal sprays.  Smoke. What are the signs or symptoms? The main symptoms of this condition are pain and a feeling of pressure around the affected sinuses. Other symptoms include:  Stuffy nose or congestion.  Thick drainage from your nose.  Swelling and warmth over the affected sinuses.  Headache.  Upper toothache.  A cough that may get worse at night.  Extra mucus that collects in the throat or the back of the nose (postnasal drip).  Decreased sense of smell and taste.  Fatigue.  A fever.  Sore throat.  Bad breath. How is this diagnosed? This condition is diagnosed based on:  Your symptoms.  Your medical history.  A physical exam.  Tests to find out if your condition is  acute or chronic. This may include: ? Checking your nose for nasal polyps. ? Viewing your sinuses using a device that has a light (endoscope). ? Testing for allergies or bacteria. ? Imaging tests, such as an MRI or CT scan. In rare cases, a bone biopsy may be done to rule out more serious types of fungal sinus disease. How is this treated? Treatment for sinusitis depends on the cause and whether your condition is chronic or acute.  If caused by a virus, your symptoms should go away on their own within 10 days. You may be given medicines to relieve symptoms. They include: ? Medicines that shrink swollen nasal passages (topical intranasal decongestants). ? Medicines that treat allergies (antihistamines). ? A spray that eases inflammation of the nostrils (topical intranasal corticosteroids). ? Rinses that help get rid of thick mucus in your nose (nasal saline washes).  If caused by bacteria, your health care provider may recommend waiting to see if your symptoms improve. Most bacterial infections will get better without antibiotic medicine. You may be given antibiotics if you have: ? A severe infection. ? A weak immune system.  If caused by narrow nasal passages or nasal polyps, you may need to have surgery. Follow these instructions at home: Medicines  Take, use, or apply over-the-counter and prescription medicines only as told by your health care provider. These may include nasal sprays.  If you were prescribed an antibiotic medicine, take it as told by your health care provider. Do not stop taking the antibiotic even if you start   to feel better. Hydrate and humidify   Drink enough fluid to keep your urine pale yellow. Staying hydrated will help to thin your mucus.  Use a cool mist humidifier to keep the humidity level in your home above 50%.  Inhale steam for 10-15 minutes, 3-4 times a day, or as told by your health care provider. You can do this in the bathroom while a hot shower is  running.  Limit your exposure to cool or dry air. Rest  Rest as much as possible.  Sleep with your head raised (elevated).  Make sure you get enough sleep each night. General instructions   Apply a warm, moist washcloth to your face 3-4 times a day or as told by your health care provider. This will help with discomfort.  Wash your hands often with soap and water to reduce your exposure to germs. If soap and water are not available, use hand sanitizer.  Do not smoke. Avoid being around people who are smoking (secondhand smoke).  Keep all follow-up visits as told by your health care provider. This is important. Contact a health care provider if:  You have a fever.  Your symptoms get worse.  Your symptoms do not improve within 10 days. Get help right away if:  You have a severe headache.  You have persistent vomiting.  You have severe pain or swelling around your face or eyes.  You have vision problems.  You develop confusion.  Your neck is stiff.  You have trouble breathing. Summary  Sinusitis is soreness and inflammation of your sinuses. Sinuses are hollow spaces in the bones around your face.  This condition is caused by nasal tissues that become inflamed or swollen. The swelling traps or blocks the flow of mucus. This allows bacteria, viruses, and fungi to grow, which leads to infection.  If you were prescribed an antibiotic medicine, take it as told by your health care provider. Do not stop taking the antibiotic even if you start to feel better.  Keep all follow-up visits as told by your health care provider. This is important. This information is not intended to replace advice given to you by your health care provider. Make sure you discuss any questions you have with your health care provider. Document Revised: 07/28/2017 Document Reviewed: 07/28/2017 Elsevier Patient Education  2020 [employer]  

## 2019-11-25 DIAGNOSIS — S82891B Other fracture of right lower leg, initial encounter for open fracture type I or II: Secondary | ICD-10-CM | POA: Diagnosis not present

## 2019-12-03 ENCOUNTER — Other Ambulatory Visit: Payer: Self-pay | Admitting: Physician Assistant

## 2019-12-03 DIAGNOSIS — F900 Attention-deficit hyperactivity disorder, predominantly inattentive type: Secondary | ICD-10-CM

## 2019-12-03 DIAGNOSIS — J4520 Mild intermittent asthma, uncomplicated: Secondary | ICD-10-CM

## 2019-12-03 DIAGNOSIS — M778 Other enthesopathies, not elsewhere classified: Secondary | ICD-10-CM

## 2019-12-03 NOTE — Telephone Encounter (Signed)
Medication Refill - Medication: oxycodone, adderall, albuterol inhaler  Has the patient contacted their pharmacy? Yes.   (Agent: If no, request that the patient contact the pharmacy for the refill.) (Agent: If yes, when and what did the pharmacy advise?)  Preferred Pharmacy (with phone number or street name):  [id number] - [place], [place]  [place] [address]  Phone: [phone] Fax: [phone]  Hours: Not open 24 hours     Agent: Please be advised that RX refills may take up to 3 business days. We ask that you follow-up with your pharmacy.

## 2019-12-04 MED ORDER — ALBUTEROL SULFATE HFA 108 (90 BASE) MCG/ACT IN AERS: INHALATION_SPRAY | RESPIRATORY_TRACT | 0 refills | Status: DC

## 2019-12-04 NOTE — Telephone Encounter (Signed)
Requested  medications are  due for refill today yes  Requested medications are on the active medication list yes  Last visit 01/2020   Notes to clinic Both meds are "Not Delegated"

## 2019-12-06 MED ORDER — OXYCODONE-ACETAMINOPHEN 7.5-325 MG PO TABS
1.0000 | ORAL_TABLET | Freq: Two times a day (BID) | ORAL | 0 refills | Status: DC
Start: 2019-12-06 — End: 2020-01-04

## 2019-12-06 MED ORDER — AMPHETAMINE-DEXTROAMPHETAMINE 30 MG PO TABS: 1.0000 | ORAL_TABLET | Freq: Two times a day (BID) | ORAL | 0 refills | Status: DC

## 2019-12-13 NOTE — Progress Notes (Signed)
Subjective:   Kathleen Buchanan is a 54 y.o. female who presents for Medicare Annual (Subsequent) preventive examination.  I connected with Kristi Norment today by telephone and verified that I am speaking with the correct person using two identifiers. Location patient: home Location provider: work Persons participating in the virtual visit: patient, provider.   I discussed the limitations, risks, security and privacy concerns of performing an evaluation and management service by telephone and the availability of in person appointments. I also discussed with the patient that there may be a patient responsible charge related to this service. The patient expressed understanding and verbally consented to this telephonic visit.    Interactive audio and video telecommunications were attempted between this provider and patient, however failed, due to patient having technical difficulties OR patient did not have access to video capability.  We continued and completed visit with audio only.   Review of Systems    N/A  Cardiac Risk Factors include: hypertension;obesity (BMI >30kg/m2);smoking/ tobacco exposure     Objective:    Today's Vitals   12/14/19 1359  PainSc: 5    There is no height or weight on file to calculate BMI.  Advanced Directives 12/14/2019 11/25/2018 11/08/2016 07/25/2015 11/08/2014 09/15/2014  Does Patient Have a Medical Advance Directive? No No No No No No  Would patient like information on creating a medical advance directive? No - Patient declined No - Patient declined No - Patient declined No - patient declined information - No - patient declined information    Current Medications (verified) Outpatient Encounter Medications as of 12/14/2019  Medication Sig  . acetaminophen (TYLENOL) 500 MG tablet Take 1,000 mg by mouth every 6 (six) hours as needed.   Marland Kitchen albuterol (VENTOLIN HFA) 108 (90 Base) MCG/ACT inhaler INHALE 2 PUFFS INTO THE LUNGS EVERY 4 (FOUR) HOURS AS NEEDED FOR  WHEEZING OR SHORTNESS OF BREATH.  Marland Kitchen ALPRAZolam (XANAX) 1 MG tablet Take 1 tablet (1 mg total) by mouth 2 (two) times daily.  Marland Kitchen amoxicillin-clavulanate (AUGMENTIN) 875-125 MG tablet Take 1 tablet by mouth 2 (two) times daily.  Marland Kitchen amphetamine-dextroamphetamine (ADDERALL) 30 MG tablet Take 1 tablet by mouth 2 (two) times daily.  Marland Kitchen ascorbic acid (VITAMIN C) 500 MG tablet Take 1,000 mg by mouth daily.   . cetirizine (ZYRTEC) 10 MG tablet Take 10 mg by mouth daily.  Marland Kitchen ibuprofen (ADVIL,MOTRIN) 200 MG tablet Take 4 tablets by mouth as needed.  . Multiple Vitamin (MULTIVITAMIN) capsule Take 1 capsule by mouth daily.  . Multiple Vitamins-Minerals (HAIR SKIN AND NAILS FORMULA PO) Take 1 tablet by mouth daily.  Marland Kitchen oxyCODONE-acetaminophen (ENDOCET) 7.5-325 MG tablet Take 1 tablet by mouth 2 (two) times daily.  . pantoprazole (PROTONIX) 40 MG tablet TAKE 1 TABLET BY MOUTH EVERY DAY  . polyethylene glycol powder (GLYCOLAX/MIRALAX) powder Take 17 g by mouth 2 (two) times daily as needed.  . Aspirin-Caffeine (BC FAST PAIN RELIEF PO) Take by mouth as needed. (Patient not taking: Reported on 11/24/2019)  . baclofen (LIORESAL) 10 MG tablet Take 1 tablet (10 mg total) by mouth 3 (three) times daily. (Patient not taking: Reported on 07/02/2019)  . hydrochlorothiazide (HYDRODIURIL) 12.5 MG tablet TAKE 1 TABLET BY MOUTH DAILY. (Patient not taking: Reported on 07/02/2019)  . ondansetron (ZOFRAN) 4 MG tablet Take 1 tablet (4 mg total) by mouth every 8 (eight) hours as needed. (Patient not taking: Reported on 11/24/2019)   No facility-administered encounter medications on file as of 12/14/2019.    Allergies (verified) Codeine,  Erythromycin, Hydrocodone-acetaminophen, Lansoprazole, Levofloxacin, Migraine formula  [aspirin-acetaminophen-caffeine], Morphine sulfate, Omeprazole, Pepcid  [famotidine], and Sulfa antibiotics   History: Past Medical History:  Diagnosis Date  . Asthma   . COPD (chronic obstructive pulmonary  disease) ([place])    Past Surgical History:  Procedure Laterality Date  . ABLATION  2011  . BREAST REDUCTION SURGERY Bilateral 08/1991  . CESAREAN SECTION     X 2  . REDUCTION MAMMAPLASTY Bilateral 1990?  . TUBAL LIGATION  1999   Family History  Problem Relation Age of Onset  . Lung cancer Father   . Ulcers Father   . Cirrhosis Father   . Colon polyps Maternal Aunt   . Ulcers Mother   . Colon polyps Mother   . Diabetes Mother   . Hyperlipidemia Mother   . Aneurysm Maternal Grandmother   . Throat cancer Maternal Grandfather    Social History   Socioeconomic History  . Marital status: Divorced    Spouse name: Not on file  . Number of children: 2  . Years of education: Not on file  . Highest education level: 11th grade  Occupational History  . Occupation: disability  Tobacco Use  . Smoking status: Current Every Day Smoker    Packs/day: 0.25    Types: Cigarettes  . Smokeless tobacco: Never Used  Vaping Use  . Vaping Use: Never used  Substance and Sexual Activity  . Alcohol use: No    Alcohol/week: 0.0 standard drinks  . Drug use: No  . Sexual activity: Not on file  Other Topics Concern  . Not on file  Social History Narrative  . Not on file   Social Determinants of Health   Financial Resource Strain: Low Risk   . Difficulty of Paying Living Expenses: Not hard at all  Food Insecurity: No Food Insecurity  . Worried About Charity fundraiser in the Last Year: Never true  . Ran Out of Food in the Last Year: Never true  Transportation Needs: No Transportation Needs  . Lack of Transportation (Medical): No  . Lack of Transportation (Non-Medical): No  Physical Activity: Inactive  . Days of Exercise per Week: 0 days  . Minutes of Exercise per Session: 0 min  Stress: Stress Concern Present  . Feeling of Stress : To some extent  Social Connections: Socially Isolated  . Frequency of Communication with Friends and Family: More than three times a week  . Frequency of  Social Gatherings with Friends and Family: More than three times a week  . Attends Religious Services: Never  . Active Member of Clubs or Organizations: No  . Attends Archivist Meetings: Never  . Marital Status: Divorced    Tobacco Counseling Ready to quit: No Counseling given: No   Clinical Intake:  Pre-visit preparation completed: Yes  Pain : 0-10 Pain Score: 5  Pain Type: Acute pain Pain Location: Ankle Pain Orientation: Right Pain Descriptors / Indicators: Aching Pain Frequency: Constant Pain Relieving Factors: Taking Oxycodone and Tylenol or Advil as needed for pain.  Pain Relieving Factors: Taking Oxycodone and Tylenol or Advil as needed for pain.  Nutritional Status: BMI > 30  Obese Nutritional Risks: None Diabetes: No  How often do you need to have someone help you when you read instructions, pamphlets, or other written materials from your doctor or pharmacy?: 1 - Never  Diabetic? No  Interpreter Needed?: No  Information entered by :: Mmarkoski, LPN   Activities of Daily Living In your present state of  health, do you have any difficulty performing the following activities: 12/14/2019  Hearing? Y  Comment Has trouble hearing and plans to see ENT soon. Has an issue with fluid build up.  Vision? N  Difficulty concentrating or making decisions? N  Walking or climbing stairs? Y  Comment Due to right ankle pain.  Dressing or bathing? N  Doing errands, shopping? Y  Comment Not driving currently due to broken right ankle.  Preparing Food and eating ? N  Using the Toilet? N  In the past six months, have you accidently leaked urine? N  Do you have problems with loss of bowel control? N  Managing your Medications? N  Managing your Finances? N  Housekeeping or managing your Housekeeping? Y  Comment Has assistance with housework.  Some recent data might be hidden    Patient Care Team: Mar Daring, PA-C as PCP - General (Family  Medicine) Francee Gentile, MD as Referring Physician (Orthopedic Surgery)  Indicate any recent Medical Services you may have received from other than [facility] providers in the past year (date may be approximate).     Assessment:   This is a routine wellness examination for Morgan.  Hearing/Vision screen No exam data present  Dietary issues and exercise activities discussed: Current Exercise Habits: The patient does not participate in regular exercise at present, Exercise limited by: orthopedic condition(s)  Goals    . Prevent falls     Recommend to remove any items from the home that may cause slips or trips.    . Quit smoking / using tobacco     Recommend to quit smoking. Pt to cut back from 2 cigarettes a day to none.       Depression Screen PHQ 2/9 Scores 12/14/2019 11/25/2018 11/08/2016 05/10/2016 10/06/2015  PHQ - 2 Score 0 0 0 0 0  PHQ- 9 Score - - - 5 -    Fall Risk Fall Risk  12/14/2019 11/25/2018 11/08/2016 05/10/2016 10/06/2015  Falls in the past year? 1 0 No No No  Number falls in past yr: 0 0 - - -  Injury with Fall? 1 0 - - -  Follow up Falls prevention discussed - - - -    Any stairs in or around the home? Yes  If so, are there any without handrails? No  Home free of loose throw rugs in walkways, pet beds, electrical cords, etc? Yes  Adequate lighting in your home to reduce risk of falls? Yes   ASSISTIVE DEVICES UTILIZED TO PREVENT FALLS:  Life alert? No  Use of a cane, walker or w/c? No  Grab bars in the bathroom? No  Shower chair or bench in shower? No  Elevated toilet seat or a handicapped toilet? No   Cognitive Function:     6CIT Screen 12/14/2019  What Year? 0 points  What month? 0 points  What time? 0 points  Count back from 20 0 points  Months in reverse 0 points  Repeat phrase 0 points  Total Score 0    Immunizations Immunization History  Administered Date(s) Administered  . Tdap 06/23/2019    TDAP status: Up to date Flu Vaccine status:  Declined, Education has been provided regarding the importance of this vaccine but patient still declined. Advised may receive this vaccine at local pharmacy or Health Dept. Aware to provide a copy of the vaccination record if obtained from local pharmacy or Health Dept. Verbalized acceptance and understanding. Covid-19 vaccine status: Declined, Education has been provided regarding  the importance of this vaccine but patient still declined. Advised may receive this vaccine at local pharmacy or Health Dept.or vaccine clinic. Aware to provide a copy of the vaccination record if obtained from local pharmacy or Health Dept. Verbalized acceptance and understanding.  Qualifies for Shingles Vaccine? Yes   Zostavax completed No   Shingrix Completed?: No.    Education has been provided regarding the importance of this vaccine. Patient has been advised to call insurance company to determine out of pocket expense if they have not yet received this vaccine. Advised may also receive vaccine at local pharmacy or Health Dept. Verbalized acceptance and understanding.  Screening Tests Health Maintenance  Topic Date Due  . COVID-19 Vaccine (1) 12/30/2019 (Originally 05/02/1977)  . INFLUENZA VACCINE  06/08/2020 (Originally 10/10/2019)  . MAMMOGRAM  12/13/2020 (Originally 04/26/2018)  . COLONOSCOPY  12/13/2020 (Originally 05/03/2015)  . HIV Screening  03/11/2026 (Originally [birth year]02[birth year]1980)  . TETANUS/TDAP  06/22/2029  . PAP SMEAR-Modifier  Discontinued    Health Maintenance  There are no preventive care reminders to display for this patient.  Colorectal cancer screening: Currently due. Declined a colonoscopy referral or cologuard order at this time. Mammogram status: Currently due, declined order at this time.   Lung Cancer Screening: (Low Dose CT Chest recommended if Age 46-80 years, 30 pack-year currently smoking OR have quit w/in 15years.) does qualify.   Additional Screening:  Vision Screening: Recommended  annual ophthalmology exams for early detection of glaucoma and other disorders of the eye. Is the patient up to date with their annual eye exam?  Yes  Who is the provider or what is the name of the office in which the patient attends annual eye exams? [facility] If pt is not established with a provider, would they like to be referred to a provider to establish care? No .   Dental Screening: Recommended annual dental exams for proper oral hygiene  Community Resource Referral / Chronic Care Management: CRR required this visit?  No   CCM required this visit?  No      Plan:     I have personally reviewed and noted the following in the patient's chart:   . Medical and social history . Use of alcohol, tobacco or illicit drugs  . Current medications and supplements . Functional ability and status . Nutritional status . Physical activity . Advanced directives . List of other physicians . Hospitalizations, surgeries, and ER visits in previous 12 months . Vitals . Screenings to include cognitive, depression, and falls . Referrals and appointments  In addition, I have reviewed and discussed with patient certain preventive protocols, quality metrics, and best practice recommendations. A written personalized care plan for preventive services as well as general preventive health recommendations were provided to patient.     Darlin Stenseth [place], [place]   64/[birth year]30   Nurse Notes: Pt declined a colonoscopy referral, mammogram order, cologuard kit or a future flu and Covid vaccine.

## 2019-12-14 ENCOUNTER — Other Ambulatory Visit: Payer: Self-pay

## 2019-12-14 ENCOUNTER — Ambulatory Visit (INDEPENDENT_AMBULATORY_CARE_PROVIDER_SITE_OTHER): Payer: Medicare Other

## 2019-12-14 DIAGNOSIS — Z0000 Encounter for general adult medical examination without abnormal findings: Secondary | ICD-10-CM | POA: Diagnosis not present

## 2019-12-14 DIAGNOSIS — Z7409 Other reduced mobility: Secondary | ICD-10-CM | POA: Diagnosis not present

## 2019-12-14 DIAGNOSIS — J449 Chronic obstructive pulmonary disease, unspecified: Secondary | ICD-10-CM | POA: Diagnosis not present

## 2019-12-14 DIAGNOSIS — S82891E Other fracture of right lower leg, subsequent encounter for open fracture type I or II with routine healing: Secondary | ICD-10-CM | POA: Diagnosis not present

## 2019-12-14 NOTE — Patient Instructions (Signed)
Ms. Canty , Thank you for taking time to come for your Medicare Wellness Visit. I appreciate your ongoing commitment to your health goals. Please review the following plan we discussed and let me know if I can assist you in the future.   Screening recommendations/referrals: Colonoscopy: Currently due declined referral and cologuard order at this time. Mammogram: Currently due, declined order at this time.  Recommended yearly ophthalmology/optometry visit for glaucoma screening and checkup Recommended yearly dental visit for hygiene and checkup  Vaccinations: Influenza vaccine: Currently due, declined receiving.  Tdap vaccine: Up to date, due 06/2029 Shingles vaccine: Shingrix discussed. Please contact your pharmacy for coverage information.     Advanced directives: Please bring a copy of your POA (Power of Attorney) and/or Living Will to your next appointment.   Conditions/risks identified: Smoking cessation and fall risk preventatives discussed today.   Next appointment: 12/19/20 @ 2:00 PM for an AWV. Will call back to schedule a follow up with PCP.   Preventive Care 40-64 Years, Female Preventive care refers to lifestyle choices and visits with your health care provider that can promote health and wellness. What does preventive care include?  A yearly physical exam. This is also called an annual well check.  Dental exams once or twice a year.  Routine eye exams. Ask your health care provider how often you should have your eyes checked.  Personal lifestyle choices, including:  Daily care of your teeth and gums.  Regular physical activity.  Eating a healthy diet.  Avoiding tobacco and drug use.  Limiting alcohol use.  Practicing safe sex.  Taking low-dose aspirin daily starting at age 20.  Taking vitamin and mineral supplements as recommended by your health care provider. What happens during an annual well check? The services and screenings done by your health care  provider during your annual well check will depend on your age, overall health, lifestyle risk factors, and family history of disease. Counseling  Your health care provider may ask you questions about your:  Alcohol use.  Tobacco use.  Drug use.  Emotional well-being.  Home and relationship well-being.  Sexual activity.  Eating habits.  Work and work Statistician.  Method of birth control.  Menstrual cycle.  Pregnancy history. Screening  You may have the following tests or measurements:  Height, weight, and BMI.  Blood pressure.  Lipid and cholesterol levels. These may be checked every 5 years, or more frequently if you are over 36 years old.  Skin check.  Lung cancer screening. You may have this screening every year starting at age 77 if you have a 30-pack-year history of smoking and currently smoke or have quit within the past 15 years.  Fecal occult blood test (FOBT) of the stool. You may have this test every year starting at age 71.  Flexible sigmoidoscopy or colonoscopy. You may have a sigmoidoscopy every 5 years or a colonoscopy every 10 years starting at age 31.  Hepatitis C blood test.  Hepatitis B blood test.  Sexually transmitted disease (STD) testing.  Diabetes screening. This is done by checking your blood sugar (glucose) after you have not eaten for a while (fasting). You may have this done every 1-3 years.  Mammogram. This may be done every 1-2 years. Talk to your health care provider about when you should start having regular mammograms. This may depend on whether you have a family history of breast cancer.  BRCA-related cancer screening. This may be done if you have a family history of breast,  ovarian, tubal, or peritoneal cancers.  Pelvic exam and Pap test. This may be done every 3 years starting at age 83. Starting at age 43, this may be done every 5 years if you have a Pap test in combination with an HPV test.  Bone density scan. This is done  to screen for osteoporosis. You may have this scan if you are at high risk for osteoporosis. Discuss your test results, treatment options, and if necessary, the need for more tests with your health care provider. Vaccines  Your health care provider may recommend certain vaccines, such as:  Influenza vaccine. This is recommended every year.  Tetanus, diphtheria, and acellular pertussis (Tdap, Td) vaccine. You may need a Td booster every 10 years.  Zoster vaccine. You may need this after age 67.  Pneumococcal 13-valent conjugate (PCV13) vaccine. You may need this if you have certain conditions and were not previously vaccinated.  Pneumococcal polysaccharide (PPSV23) vaccine. You may need one or two doses if you smoke cigarettes or if you have certain conditions. Talk to your health care provider about which screenings and vaccines you need and how often you need them. This information is not intended to replace advice given to you by your health care provider. Make sure you discuss any questions you have with your health care provider. Document Released: 03/24/2015 Document Revised: 11/15/2015 Document Reviewed: 12/27/2014 Elsevier Interactive Patient Education  2017 [employer] Prevention in the Home Falls can cause injuries. They can happen to people of all ages. There are many things you can do to make your home safe and to help prevent falls. What can I do on the outside of my home?  Regularly fix the edges of walkways and driveways and fix any cracks.  Remove anything that might make you trip as you walk through a door, such as a raised step or threshold.  Trim any bushes or trees on the path to your home.  Use bright outdoor lighting.  Clear any walking paths of anything that might make someone trip, such as rocks or tools.  Regularly check to see if handrails are loose or broken. Make sure that both sides of any steps have handrails.  Any raised decks and porches  should have guardrails on the edges.  Have any leaves, snow, or ice cleared regularly.  Use sand or salt on walking paths during winter.  Clean up any spills in your garage right away. This includes oil or grease spills. What can I do in the bathroom?  Use night lights.  Install grab bars by the toilet and in the tub and shower. Do not use towel bars as grab bars.  Use non-skid mats or decals in the tub or shower.  If you need to sit down in the shower, use a plastic, non-slip stool.  Keep the floor dry. Clean up any water that spills on the floor as soon as it happens.  Remove soap buildup in the tub or shower regularly.  Attach bath mats securely with double-sided non-slip rug tape.  Do not have throw rugs and other things on the floor that can make you trip. What can I do in the bedroom?  Use night lights.  Make sure that you have a light by your bed that is easy to reach.  Do not use any sheets or blankets that are too big for your bed. They should not hang down onto the floor.  Have a firm chair that has side  arms. You can use this for support while you get dressed.  Do not have throw rugs and other things on the floor that can make you trip. What can I do in the kitchen?  Clean up any spills right away.  Avoid walking on wet floors.  Keep items that you use a lot in easy-to-reach places.  If you need to reach something above you, use a strong step stool that has a grab bar.  Keep electrical cords out of the way.  Do not use floor polish or wax that makes floors slippery. If you must use wax, use non-skid floor wax.  Do not have throw rugs and other things on the floor that can make you trip. What can I do with my stairs?  Do not leave any items on the stairs.  Make sure that there are handrails on both sides of the stairs and use them. Fix handrails that are broken or loose. Make sure that handrails are as long as the stairways.  Check any carpeting to  make sure that it is firmly attached to the stairs. Fix any carpet that is loose or worn.  Avoid having throw rugs at the top or bottom of the stairs. If you do have throw rugs, attach them to the floor with carpet tape.  Make sure that you have a light switch at the top of the stairs and the bottom of the stairs. If you do not have them, ask someone to add them for you. What else can I do to help prevent falls?  Wear shoes that:  Do not have high heels.  Have rubber bottoms.  Are comfortable and fit you well.  Are closed at the toe. Do not wear sandals.  If you use a stepladder:  Make sure that it is fully opened. Do not climb a closed stepladder.  Make sure that both sides of the stepladder are locked into place.  Ask someone to hold it for you, if possible.  Clearly mark and make sure that you can see:  Any grab bars or handrails.  First and last steps.  Where the edge of each step is.  Use tools that help you move around (mobility aids) if they are needed. These include:  Canes.  Walkers.  Scooters.  Crutches.  Turn on the lights when you go into a dark area. Replace any light bulbs as soon as they burn out.  Set up your furniture so you have a clear path. Avoid moving your furniture around.  If any of your floors are uneven, fix them.  If there are any pets around you, be aware of where they are.  Review your medicines with your doctor. Some medicines can make you feel dizzy. This can increase your chance of falling. Ask your doctor what other things that you can do to help prevent falls. This information is not intended to replace advice given to you by your health care provider. Make sure you discuss any questions you have with your health care provider. Document Released: 12/22/2008 Document Revised: 08/03/2015 Document Reviewed: 04/01/2014 Elsevier Interactive Patient Education  2017 [employer].

## 2019-12-27 DIAGNOSIS — Z7409 Other reduced mobility: Secondary | ICD-10-CM | POA: Diagnosis not present

## 2019-12-27 DIAGNOSIS — J449 Chronic obstructive pulmonary disease, unspecified: Secondary | ICD-10-CM | POA: Diagnosis not present

## 2019-12-27 DIAGNOSIS — S82891E Other fracture of right lower leg, subsequent encounter for open fracture type I or II with routine healing: Secondary | ICD-10-CM | POA: Diagnosis not present

## 2020-01-04 ENCOUNTER — Other Ambulatory Visit: Payer: Self-pay | Admitting: Physician Assistant

## 2020-01-04 DIAGNOSIS — M778 Other enthesopathies, not elsewhere classified: Secondary | ICD-10-CM

## 2020-01-04 NOTE — Telephone Encounter (Signed)
Refill request for Adderall; dose not specified; contacted pt to verify dose; she verified dose is 30 mg.  Requested medication (s) are due for refill today: Adderall 30 mg; yes   Requested medication (s) are on the active medication list: yes  Last refill 12/06/19:    Future visit scheduled: no  Notes to clinic: not delegated  Requested medication (s) are due for refill today: Oxycodone, yes  Requested medication (s) are on the active medication list: yes  Last refill:  12/06/19  Future visit scheduled: no  Notes to clinic: not delegated

## 2020-01-04 NOTE — Telephone Encounter (Signed)
Medication Refill - Medication: Adderall and Oxycodone  Has the patient contacted their pharmacy? No. (Agent: If no, request that the patient contact the pharmacy for the refill.) (Agent: If yes, when and what did the pharmacy advise?)  Preferred Pharmacy (with phone number or street name): [id number] [place], [place]: Please be advised that RX refills may take up to 3 business days. We ask that you follow-up with your pharmacy.

## 2020-01-05 DIAGNOSIS — Z7409 Other reduced mobility: Secondary | ICD-10-CM | POA: Diagnosis not present

## 2020-01-05 DIAGNOSIS — J449 Chronic obstructive pulmonary disease, unspecified: Secondary | ICD-10-CM | POA: Diagnosis not present

## 2020-01-05 DIAGNOSIS — S82891E Other fracture of right lower leg, subsequent encounter for open fracture type I or II with routine healing: Secondary | ICD-10-CM | POA: Diagnosis not present

## 2020-01-05 MED ORDER — OXYCODONE-ACETAMINOPHEN 7.5-325 MG PO TABS: 1.0000 | ORAL_TABLET | Freq: Two times a day (BID) | ORAL | 0 refills | Status: DC

## 2020-01-07 ENCOUNTER — Other Ambulatory Visit: Payer: Self-pay | Admitting: Physician Assistant

## 2020-01-07 DIAGNOSIS — S82891E Other fracture of right lower leg, subsequent encounter for open fracture type I or II with routine healing: Secondary | ICD-10-CM | POA: Diagnosis not present

## 2020-01-07 DIAGNOSIS — F419 Anxiety disorder, unspecified: Secondary | ICD-10-CM

## 2020-01-07 DIAGNOSIS — F900 Attention-deficit hyperactivity disorder, predominantly inattentive type: Secondary | ICD-10-CM

## 2020-01-07 DIAGNOSIS — J449 Chronic obstructive pulmonary disease, unspecified: Secondary | ICD-10-CM | POA: Diagnosis not present

## 2020-01-07 DIAGNOSIS — Z7409 Other reduced mobility: Secondary | ICD-10-CM | POA: Diagnosis not present

## 2020-01-07 NOTE — Telephone Encounter (Signed)
Patient states that medication was not sent  Requested Prescriptions  Pending Prescriptions Disp Refills   amphetamine-dextroamphetamine (ADDERALL) 30 MG tablet 60 tablet 0    Sig: Take 1 tablet by mouth 2 (two) times daily.      Not Delegated - Psychiatry:  Stimulants/ADHD Failed - 01/07/2020 11:19 AM      Failed - This refill cannot be delegated      Failed - Urine Drug Screen completed in last 360 days      Passed - Valid encounter within last 3 months    Recent Outpatient Visits           1 month ago Acute non-recurrent pansinusitis   [id number] Fenton Malling M, PA-C   6 months ago Type I or II open fracture of right ankle with routine healing, subsequent encounter   [place], [place]   12 months ago Acute pansinusitis, recurrence not specified   [place], Vickki Muff, [place]   1 year ago Acute pansinusitis, recurrence not specified   [place], Clearnce Sorrel, [place]   1 year ago Chronic narcotic use   [facility] [place], [place], [place]

## 2020-01-07 NOTE — Telephone Encounter (Signed)
Requested medication (s) are due for refill today: yes  Requested medication (s) are on the active medication list: yes  Last refill: 12/06/2019  Future visit scheduled: yes   Notes to clinic:  this refill cannot be delegated    Requested Prescriptions  Pending Prescriptions Disp Refills   ALPRAZolam (XANAX) 1 MG tablet [Pharmacy Med Name: ALPRAZOLAM 1 MG TABLET] 60 tablet     Sig: TAKE 1 TABLET BY MOUTH TWICE A DAY      Not Delegated - Psychiatry:  Anxiolytics/Hypnotics Failed - 01/07/2020 10:55 AM      Failed - This refill cannot be delegated      Failed - Urine Drug Screen completed in last 360 days      Passed - Valid encounter within last 6 months    Recent Outpatient Visits           1 month ago Acute non-recurrent pansinusitis   [facility] Fenton Malling M, PA-C   6 months ago Type I or II open fracture of right ankle with routine healing, subsequent encounter   [place], [place]   12 months ago Acute pansinusitis, recurrence not specified   [place], [place], [place]   1 year ago Acute pansinusitis, recurrence not specified   [place], Clearnce Sorrel, [place]   1 year ago Chronic narcotic use   [facility] [place], [place], [place]

## 2020-01-07 NOTE — Telephone Encounter (Signed)
Medication Refill - Medication: amphetamine-dextroamphetamine (ADDERALL) 30 MG tablet (Patient requested 2 medications  previously, however only 1 medication was sent over to pharmacy. Patient needs medication sent to pharmacy today. )   Has the patient contacted their pharmacy? yes (Agent: If no, request that the patient contact the pharmacy for the refill.) (Agent: If yes, when and what did the pharmacy advise?)Contact PCP  Preferred Pharmacy (with phone number or street name):  [id number] [place], [place] Phone:  [phone]  Fax:  [phone]       Agent: Please be advised that RX refills may take up to 3 business days. We ask that you follow-up with your pharmacy.

## 2020-01-10 DIAGNOSIS — S82891B Other fracture of right lower leg, initial encounter for open fracture type I or II: Secondary | ICD-10-CM | POA: Diagnosis not present

## 2020-01-10 MED ORDER — AMPHETAMINE-DEXTROAMPHETAMINE 30 MG PO TABS: 1.0000 | ORAL_TABLET | Freq: Two times a day (BID) | ORAL | 0 refills | Status: DC

## 2020-01-11 DIAGNOSIS — S82891E Other fracture of right lower leg, subsequent encounter for open fracture type I or II with routine healing: Secondary | ICD-10-CM | POA: Diagnosis not present

## 2020-01-11 DIAGNOSIS — Z7409 Other reduced mobility: Secondary | ICD-10-CM | POA: Diagnosis not present

## 2020-01-17 DIAGNOSIS — S92132D Displaced fracture of posterior process of left talus, subsequent encounter for fracture with routine healing: Secondary | ICD-10-CM | POA: Diagnosis not present

## 2020-01-17 DIAGNOSIS — S8251XD Displaced fracture of medial malleolus of right tibia, subsequent encounter for closed fracture with routine healing: Secondary | ICD-10-CM | POA: Diagnosis not present

## 2020-01-17 DIAGNOSIS — Z881 Allergy status to other antibiotic agents status: Secondary | ICD-10-CM | POA: Diagnosis not present

## 2020-01-17 DIAGNOSIS — Z9889 Other specified postprocedural states: Secondary | ICD-10-CM | POA: Diagnosis not present

## 2020-01-17 DIAGNOSIS — Z882 Allergy status to sulfonamides status: Secondary | ICD-10-CM | POA: Diagnosis not present

## 2020-01-17 DIAGNOSIS — S82851D Displaced trimalleolar fracture of right lower leg, subsequent encounter for closed fracture with routine healing: Secondary | ICD-10-CM | POA: Diagnosis not present

## 2020-01-17 DIAGNOSIS — Z888 Allergy status to other drugs, medicaments and biological substances status: Secondary | ICD-10-CM | POA: Diagnosis not present

## 2020-01-17 DIAGNOSIS — M19071 Primary osteoarthritis, right ankle and foot: Secondary | ICD-10-CM | POA: Diagnosis not present

## 2020-01-17 DIAGNOSIS — S92141D Displaced dome fracture of right talus, subsequent encounter for fracture with routine healing: Secondary | ICD-10-CM | POA: Diagnosis not present

## 2020-01-17 DIAGNOSIS — Z885 Allergy status to narcotic agent status: Secondary | ICD-10-CM | POA: Diagnosis not present

## 2020-01-17 DIAGNOSIS — S8261XD Displaced fracture of lateral malleolus of right fibula, subsequent encounter for closed fracture with routine healing: Secondary | ICD-10-CM | POA: Diagnosis not present

## 2020-01-17 DIAGNOSIS — S82891E Other fracture of right lower leg, subsequent encounter for open fracture type I or II with routine healing: Secondary | ICD-10-CM | POA: Diagnosis not present

## 2020-01-17 DIAGNOSIS — M85871 Other specified disorders of bone density and structure, right ankle and foot: Secondary | ICD-10-CM | POA: Diagnosis not present

## 2020-02-08 ENCOUNTER — Other Ambulatory Visit: Payer: Self-pay | Admitting: Physician Assistant

## 2020-02-08 DIAGNOSIS — F900 Attention-deficit hyperactivity disorder, predominantly inattentive type: Secondary | ICD-10-CM

## 2020-02-08 DIAGNOSIS — M778 Other enthesopathies, not elsewhere classified: Secondary | ICD-10-CM

## 2020-02-08 MED ORDER — AMPHETAMINE-DEXTROAMPHETAMINE 30 MG PO TABS: 1.0000 | ORAL_TABLET | Freq: Two times a day (BID) | ORAL | 0 refills | Status: DC

## 2020-02-08 MED ORDER — OXYCODONE-ACETAMINOPHEN 7.5-325 MG PO TABS: 1.0000 | ORAL_TABLET | Freq: Two times a day (BID) | ORAL | 0 refills | Status: DC

## 2020-02-08 NOTE — Telephone Encounter (Signed)
Patient requesting amphetamine-dextroamphetamine (ADDERALL) 30 MG tablet and oxyCODONE-acetaminophen (ENDOCET) 7.5-325 MG tablet , informed please allow 48 to 72 hour turn around time   [id number] [place], [place] Phone:  [phone]  Fax:  [phone]

## 2020-02-08 NOTE — Telephone Encounter (Signed)
Requested medication (s) are due for refill today:  yes   Requested medication (s) are on the active medication list: yes   Last refill:  01/05/2020  Future visit scheduled: no  Notes to clinic:  this refill cannot be delegated    Requested Prescriptions  Pending Prescriptions Disp Refills   oxyCODONE-acetaminophen (ENDOCET) 7.5-325 MG tablet 60 tablet 0    Sig: Take 1 tablet by mouth 2 (two) times daily.      Not Delegated - Analgesics:  Opioid Agonist Combinations Failed - 02/08/2020  2:12 PM      Failed - This refill cannot be delegated      Failed - Urine Drug Screen completed in last 360 days      Passed - Valid encounter within last 6 months    Recent Outpatient Visits           2 months ago Acute non-recurrent pansinusitis   [facility] Fenton Malling M, PA-C   7 months ago Type I or II open fracture of right ankle with routine healing, subsequent encounter   [facility] [place], Clearnce Sorrel, [place]   1 year ago Acute pansinusitis, recurrence not specified   [place], [place], [place]   1 year ago Acute pansinusitis, recurrence not specified   [facility] [place], [place], [place]   2 years ago Chronic narcotic use   [facility] Fenton Malling M, [place]                amphetamine-dextroamphetamine (ADDERALL) 30 MG tablet 60 tablet 0    Sig: Take 1 tablet by mouth 2 (two) times daily.      Not Delegated - Psychiatry:  Stimulants/ADHD Failed - 02/08/2020  2:12 PM      Failed - This refill cannot be delegated      Failed - Urine Drug Screen completed in last 360 days      Passed - Valid encounter within last 3 months    Recent Outpatient Visits           2 months ago Acute non-recurrent pansinusitis   [facility] Fenton Malling M, PA-C   7 months ago Type I or II open fracture of right ankle with routine healing, subsequent encounter   [place], [place]   1 year ago Acute pansinusitis, recurrence not specified   [place], Vickki Muff, [place]   1 year ago Acute pansinusitis, recurrence not specified   [place], Clearnce Sorrel, [place]   2 years ago Chronic narcotic use   [place], [place], [place]

## 2020-02-10 ENCOUNTER — Other Ambulatory Visit: Payer: Self-pay | Admitting: Physician Assistant

## 2020-02-10 DIAGNOSIS — J0140 Acute pansinusitis, unspecified: Secondary | ICD-10-CM

## 2020-02-10 DIAGNOSIS — F419 Anxiety disorder, unspecified: Secondary | ICD-10-CM

## 2020-02-10 NOTE — Telephone Encounter (Signed)
Requested medication (s) are due for refill today: no  Requested medication (s) are on the active medication list: yes  Last refill: 11/24/2019  Future visit scheduled: no  Notes to clinic:  medication not assigned to a protocol, review manually    Requested Prescriptions  Pending Prescriptions Disp Refills   amoxicillin-clavulanate (AUGMENTIN) 875-125 MG tablet [Pharmacy Med Name: AMOXICILLIN-CLAV 875-125MG  TAB] 30 tablet 0    Sig: TAKE 1 TABLET BY MOUTH TWICE A DAY      Off-Protocol Failed - 02/10/2020  1:46 PM      Failed - Medication not assigned to a protocol, review manually.      Passed - Valid encounter within last 12 months    Recent Outpatient Visits           2 months ago Acute non-recurrent pansinusitis   [facility] [place], Anderson Malta M, [place]   7 months ago Type I or II open fracture of right ankle with routine healing, subsequent encounter   [facility] [place], Clearnce Sorrel, [place]   1 year ago Acute pansinusitis, recurrence not specified   [place], Vickki Muff, [place]   1 year ago Acute pansinusitis, recurrence not specified   [facility], Clearnce Sorrel, [place]   2 years ago Chronic narcotic use   [facility] [place], Anderson Malta M, PA-C                ALPRAZolam Duanne Moron) 1 MG tablet [Pharmacy Med Name: ALPRAZOLAM 1 MG TABLET] 60 tablet 0    Sig: TAKE 1 TABLET BY MOUTH TWICE A DAY      Not Delegated - Psychiatry:  Anxiolytics/Hypnotics Failed - 02/10/2020  1:46 PM      Failed - This refill cannot be delegated      Failed - Urine Drug Screen completed in last 360 days      Passed - Valid encounter within last 6 months    Recent Outpatient Visits           2 months ago Acute non-recurrent pansinusitis   [facility] Fenton Malling M, PA-C   7 months ago Type I or II open fracture of right ankle with routine healing, subsequent encounter   [place], Clearnce Sorrel, [place]   1 year ago Acute pansinusitis, recurrence not specified   [place], [place]   1 year ago Acute pansinusitis, recurrence not specified   [place], Clearnce Sorrel, [place]   2 years ago Chronic narcotic use   [place], [place], [place]

## 2020-02-24 DIAGNOSIS — S82891B Other fracture of right lower leg, initial encounter for open fracture type I or II: Secondary | ICD-10-CM | POA: Diagnosis not present

## 2020-03-08 ENCOUNTER — Other Ambulatory Visit: Payer: Self-pay | Admitting: Physician Assistant

## 2020-03-08 DIAGNOSIS — F900 Attention-deficit hyperactivity disorder, predominantly inattentive type: Secondary | ICD-10-CM

## 2020-03-08 DIAGNOSIS — M778 Other enthesopathies, not elsewhere classified: Secondary | ICD-10-CM

## 2020-03-08 NOTE — Telephone Encounter (Signed)
Patient has scheduled an appointment for 03-15-2020 and is asking for medications until this visit / These medications can not be refilled by [facility], routing to office

## 2020-03-08 NOTE — Telephone Encounter (Signed)
amphetamine-dextroamphetamine (ADDERALL) 30 MG tablet 60 tablet 0 02/08/2020    Sig - Route: Take 1 tablet by mouth 2 (two) times daily. - Oral   Sent to pharmacy as: amphetamine-dextroamphetamine (ADDERALL) 30 MG tablet   Earliest Fill Date: 02/08/2020   Notes to Pharmacy: Do not fill <30 days from last refill    oxyCODONE-acetaminophen (ENDOCET) 7.5-325 MG tablet 60 tablet 0 02/08/2020    Sig - Route: Take 1 tablet by mouth 2 (two) times daily. - Oral   Sent to pharmacy as: oxyCODONE-acetaminophen (ENDOCET) 7.5-325 MG tablet   Earliest Fill Date: 02/08/2020   Notes to Pharmacy: Do not fill <30 days from last refill   E-Prescribing Status: Receipt confirmed by pharmacy (02/08/2020 4:39 PM EST    [id number] [place], [place] - [address] [facility] [address] [facility] Phone:  [phone]  Fax:  [phone]     Pt made soonest appt for Jan 5th  Request refill

## 2020-03-09 MED ORDER — AMPHETAMINE-DEXTROAMPHETAMINE 30 MG PO TABS: 1.0000 | ORAL_TABLET | Freq: Two times a day (BID) | ORAL | 0 refills | Status: DC

## 2020-03-09 MED ORDER — OXYCODONE-ACETAMINOPHEN 7.5-325 MG PO TABS: 1.0000 | ORAL_TABLET | Freq: Two times a day (BID) | ORAL | 0 refills | Status: DC

## 2020-03-15 ENCOUNTER — Telehealth: Payer: Medicare Other | Admitting: Physician Assistant

## 2020-03-15 ENCOUNTER — Encounter: Payer: Self-pay | Admitting: Physician Assistant

## 2020-03-15 ENCOUNTER — Telehealth (INDEPENDENT_AMBULATORY_CARE_PROVIDER_SITE_OTHER): Payer: Medicare Other | Admitting: Physician Assistant

## 2020-03-15 DIAGNOSIS — R609 Edema, unspecified: Secondary | ICD-10-CM | POA: Diagnosis not present

## 2020-03-15 DIAGNOSIS — Z91199 Patient's noncompliance with other medical treatment and regimen due to unspecified reason: Secondary | ICD-10-CM

## 2020-03-15 DIAGNOSIS — Z5329 Procedure and treatment not carried out because of patient's decision for other reasons: Secondary | ICD-10-CM

## 2020-03-15 DIAGNOSIS — K219 Gastro-esophageal reflux disease without esophagitis: Secondary | ICD-10-CM | POA: Diagnosis not present

## 2020-03-15 DIAGNOSIS — J0141 Acute recurrent pansinusitis: Secondary | ICD-10-CM | POA: Diagnosis not present

## 2020-03-15 MED ORDER — CEFDINIR 300 MG PO CAPS: 300.0000 mg | ORAL_CAPSULE | Freq: Two times a day (BID) | ORAL | 0 refills | Status: DC

## 2020-03-15 MED ORDER — HYDROCHLOROTHIAZIDE 12.5 MG PO TABS: 12.5000 mg | ORAL_TABLET | Freq: Every day | ORAL | 1 refills | Status: DC

## 2020-03-15 MED ORDER — PANTOPRAZOLE SODIUM 40 MG PO TBEC: 40.0000 mg | DELAYED_RELEASE_TABLET | Freq: Every day | ORAL | 1 refills | Status: DC

## 2020-03-15 NOTE — Progress Notes (Signed)
MyChart Video Visit    Virtual Visit via Video Note   This visit type was conducted due to national recommendations for restrictions regarding the COVID-19 Pandemic (e.g. social distancing) in an effort to limit this patient's exposure and mitigate transmission in our community. This patient is at least at moderate risk for complications without adequate follow up. This format is felt to be most appropriate for this patient at this time. Physical exam was limited by quality of the video and audio technology used for the visit.   Patient location: Home Provider location: [facility]  I discussed the limitations of evaluation and management by telemedicine and the availability of in person appointments. The patient expressed understanding and agreed to proceed.  Patient: Kathleen Buchanan   DOB: Nov 06, 1965   55 y.o. Female  MRN: [id number] Visit Date: 03/15/2020  Today's healthcare provider: Margaretann Loveless, PA-C   Chief Complaint  Patient presents with  . Sinusitis   Subjective    Sinusitis This is a new problem. The current episode started more than 1 month ago. The problem has been gradually worsening since onset. There has been no fever. Associated symptoms include congestion, ear pain, headaches, sinus pressure and a sore throat. Pertinent negatives include no chills, diaphoresis or sneezing.     Medications: Outpatient Medications Prior to Visit  Medication Sig  . acetaminophen (TYLENOL) 500 MG tablet Take 1,000 mg by mouth every 6 (six) hours as needed.   Marland Kitchen albuterol (VENTOLIN HFA) 108 (90 Base) MCG/ACT inhaler INHALE 2 PUFFS INTO THE LUNGS EVERY 4 (FOUR) HOURS AS NEEDED FOR WHEEZING OR SHORTNESS OF BREATH.  Marland Kitchen ALPRAZolam (XANAX) 1 MG tablet TAKE 1 TABLET BY MOUTH TWICE A DAY  . amoxicillin-clavulanate (AUGMENTIN) 875-125 MG tablet TAKE 1 TABLET BY MOUTH TWICE A DAY  . amphetamine-dextroamphetamine (ADDERALL) 30 MG tablet Take 1 tablet by mouth 2 (two) times daily.  Marland Kitchen ascorbic  acid (VITAMIN C) 500 MG tablet Take 1,000 mg by mouth daily.   . Aspirin-Caffeine (BC FAST PAIN RELIEF PO) Take by mouth as needed.  . baclofen (LIORESAL) 10 MG tablet Take 1 tablet (10 mg total) by mouth 3 (three) times daily.  . cetirizine (ZYRTEC) 10 MG tablet Take 10 mg by mouth daily.  . hydrochlorothiazide (HYDRODIURIL) 12.5 MG tablet TAKE 1 TABLET BY MOUTH DAILY.  Marland Kitchen ibuprofen (ADVIL,MOTRIN) 200 MG tablet Take 4 tablets by mouth as needed.  . Multiple Vitamin (MULTIVITAMIN) capsule Take 1 capsule by mouth daily.  . Multiple Vitamins-Minerals (HAIR SKIN AND NAILS FORMULA PO) Take 1 tablet by mouth daily.  Marland Kitchen oxyCODONE-acetaminophen (ENDOCET) 7.5-325 MG tablet Take 1 tablet by mouth 2 (two) times daily.  . pantoprazole (PROTONIX) 40 MG tablet TAKE 1 TABLET BY MOUTH EVERY DAY  . polyethylene glycol powder (GLYCOLAX/MIRALAX) powder Take 17 g by mouth 2 (two) times daily as needed.  . ondansetron (ZOFRAN) 4 MG tablet Take 1 tablet (4 mg total) by mouth every 8 (eight) hours as needed.   No facility-administered medications prior to visit.    Review of Systems  Constitutional: Positive for fatigue. Negative for activity change, appetite change, chills, diaphoresis, fever and unexpected weight change.  HENT: Positive for congestion, ear pain, facial swelling, postnasal drip, rhinorrhea, sinus pressure, sinus pain, sore throat and voice change. Negative for ear discharge, sneezing, tinnitus and trouble swallowing.   Eyes: Positive for itching. Negative for photophobia, pain, discharge, redness and visual disturbance.  Respiratory: Negative.   Cardiovascular: Negative.  Negative for chest pain.  Gastrointestinal: Positive for constipation. Negative for abdominal distention, abdominal pain, anal bleeding, blood in stool, diarrhea, nausea, rectal pain and vomiting.  Neurological: Positive for dizziness, light-headedness and headaches.       Objective    There were no vitals taken for this  visit.    Physical Exam Vitals reviewed.  Constitutional:      Appearance: Normal appearance. She is well-developed and well-nourished.  HENT:     Head: Normocephalic and atraumatic.  Eyes:     Extraocular Movements: EOM normal.  Pulmonary:     Effort: Pulmonary effort is normal. No respiratory distress.  Musculoskeletal:     Cervical back: Normal range of motion and neck supple.  Neurological:     Mental Status: She is alert.  Psychiatric:        Mood and Affect: Mood and affect and mood normal.        Behavior: Behavior normal.        Thought Content: Thought content normal.        Judgment: Judgment normal.       Assessment & Plan     1. Acute recurrent pansinusitis Recurrent issue. Has failed Augmentin (took 15 days on 02/11/20). Always left sided pain and congestion. Will give Omnicef as below. Allergies and intolerance limit using doxycycline, zpak, clindamycin, levaquin. Patient intolerant to steroids as well. Can not tolerate nasal sprays, reports they cause rebound headache, even flonase sensimist. Will place referral to ENT for further evaluation. Consult appreciated.  - cefdinir (OMNICEF) 300 MG capsule; Take 1 capsule (300 mg total) by mouth 2 (two) times daily.  Dispense: 20 capsule; Refill: 0 - Ambulatory referral to ENT  2. Edema, unspecified type Stable. Diagnosis pulled for medication refill. Continue current medical treatment plan. - hydrochlorothiazide (HYDRODIURIL) 12.5 MG tablet; Take 1 tablet (12.5 mg total) by mouth daily.  Dispense: 90 tablet; Refill: 1  3. Gastroesophageal reflux disease without esophagitis Stable. Diagnosis pulled for medication refill. Continue current medical treatment plan. - pantoprazole (PROTONIX) 40 MG tablet; Take 1 tablet (40 mg total) by mouth daily.  Dispense: 90 tablet; Refill: 1   No follow-ups on file.     I discussed the assessment and treatment plan with the patient. The patient was provided an opportunity to ask  questions and all were answered. The patient agreed with the plan and demonstrated an understanding of the instructions.   The patient was advised to call back or seek an in-person evaluation if the symptoms worsen or if the condition fails to improve as anticipated.  I provided 28 minutes of face-to-face time during this encounter via MyChart Video enabled encounter.   Reynolds Bowl, PA-C, have reviewed all documentation for this visit. The documentation on 03/15/20 for the exam, diagnosis, procedures, and orders are all accurate and complete.  [place] [phone] (phone) [phone] (fax)  [place]

## 2020-03-15 NOTE — Progress Notes (Signed)
Patient never got on mychart for appointment. Was contacted by phone x 2 without answer. Patient marked No Show.

## 2020-03-26 DIAGNOSIS — S82891B Other fracture of right lower leg, initial encounter for open fracture type I or II: Secondary | ICD-10-CM | POA: Diagnosis not present

## 2020-04-12 ENCOUNTER — Other Ambulatory Visit: Payer: Self-pay | Admitting: Physician Assistant

## 2020-04-12 DIAGNOSIS — M778 Other enthesopathies, not elsewhere classified: Secondary | ICD-10-CM

## 2020-04-12 DIAGNOSIS — F900 Attention-deficit hyperactivity disorder, predominantly inattentive type: Secondary | ICD-10-CM

## 2020-04-12 MED ORDER — OXYCODONE-ACETAMINOPHEN 7.5-325 MG PO TABS: 1.0000 | ORAL_TABLET | Freq: Two times a day (BID) | ORAL | 0 refills | Status: DC

## 2020-04-12 MED ORDER — AMPHETAMINE-DEXTROAMPHETAMINE 30 MG PO TABS: 1.0000 | ORAL_TABLET | Freq: Two times a day (BID) | ORAL | 0 refills | Status: DC

## 2020-04-12 NOTE — Telephone Encounter (Signed)
Medication Refill - Medication: Adderall 30mg  and Oxycodone 7.5/325mg   Has the patient contacted their pharmacy? Yes.   (Agent: If no, request that the patient contact the pharmacy for the refill.) (Agent: If yes, when and what did the pharmacy advise?)  Preferred Pharmacy (with phone number or street name): [id number] - [place], [place]: Please be advised that RX refills may take up to 3 business days. We ask that you follow-up with your pharmacy.

## 2020-04-12 NOTE — Telephone Encounter (Signed)
Requested medication (s) are due for refill today: Oxycodone-acetaminophen 7.5-325 and Adderall 30 mg, yes  Requested medication (s) are on the active medication list: yes  Last refill:  03/09/20  Future visit scheduled: no  Notes to clinic: not delegated   Requested Prescriptions  Pending Prescriptions Disp Refills   oxyCODONE-acetaminophen (ENDOCET) 7.5-325 MG tablet 60 tablet 0    Sig: Take 1 tablet by mouth 2 (two) times daily.      Not Delegated - Analgesics:  Opioid Agonist Combinations Failed - 04/12/2020 10:22 AM      Failed - This refill cannot be delegated      Failed - Urine Drug Screen completed in last 360 days      Passed - Valid encounter within last 6 months    Recent Outpatient Visits           4 weeks ago Acute recurrent pansinusitis   [facility] [place], Clearnce Sorrel, PA-C   4 weeks ago No-show for appointment   [place], [place]   4 months ago Acute non-recurrent pansinusitis   [facility] [place] Malling [place], [place]   9 months ago Type I or II open fracture of right ankle with routine healing, subsequent encounter   [facility] [place], [place], [place]   1 year ago Acute pansinusitis, recurrence not specified   [place], Vickki Muff, PA-C                  amphetamine-dextroamphetamine (ADDERALL) 30 MG tablet 60 tablet 0    Sig: Take 1 tablet by mouth 2 (two) times daily.      Not Delegated - Psychiatry:  Stimulants/ADHD Failed - 04/12/2020 10:22 AM      Failed - This refill cannot be delegated      Failed - Urine Drug Screen completed in last 360 days      Passed - Valid encounter within last 3 months    Recent Outpatient Visits           4 weeks ago Acute recurrent pansinusitis   [place], PA-C   4 weeks ago No-show for appointment   [place], [place]   4  months ago Acute non-recurrent pansinusitis   [place], [place]   9 months ago Type I or II open fracture of right ankle with routine healing, subsequent encounter   [place], [place]   1 year ago Acute pansinusitis, recurrence not specified   [place], Vickki Muff, [place]

## 2020-04-26 DIAGNOSIS — S82891B Other fracture of right lower leg, initial encounter for open fracture type I or II: Secondary | ICD-10-CM | POA: Diagnosis not present

## 2020-05-10 ENCOUNTER — Other Ambulatory Visit: Payer: Self-pay | Admitting: Physician Assistant

## 2020-05-10 DIAGNOSIS — M778 Other enthesopathies, not elsewhere classified: Secondary | ICD-10-CM

## 2020-05-10 DIAGNOSIS — F900 Attention-deficit hyperactivity disorder, predominantly inattentive type: Secondary | ICD-10-CM

## 2020-05-10 MED ORDER — OXYCODONE-ACETAMINOPHEN 7.5-325 MG PO TABS: 1.0000 | ORAL_TABLET | Freq: Two times a day (BID) | ORAL | 0 refills | Status: DC

## 2020-05-10 MED ORDER — AMPHETAMINE-DEXTROAMPHETAMINE 30 MG PO TABS: 1.0000 | ORAL_TABLET | Freq: Two times a day (BID) | ORAL | 0 refills | Status: DC

## 2020-05-10 NOTE — Telephone Encounter (Signed)
Medication: oxyCODONE-acetaminophen (ENDOCET) 7.5-325 MG tablet  amphetamine-dextroamphetamine (ADDERALL) 30 MG tablet  Has the pt contacted their pharmacy? no [id number] - [place], [place]  Please be advised refills may take up to 3 business days.  We ask that you follow up with your pharmacy.

## 2020-05-10 NOTE — Telephone Encounter (Signed)
   Requested medication (s) are on the active medication list: yes  Last refill:  05/02/2020  Future visit scheduled: no  Notes to clinic:  this refill cannot be delegated    Requested Prescriptions  Pending Prescriptions Disp Refills   amphetamine-dextroamphetamine (ADDERALL) 30 MG tablet 60 tablet 0    Sig: Take 1 tablet by mouth 2 (two) times daily.      Not Delegated - Psychiatry:  Stimulants/ADHD Failed - 05/10/2020  9:54 AM      Failed - This refill cannot be delegated      Failed - Urine Drug Screen completed in last 360 days      Passed - Valid encounter within last 3 months    Recent Outpatient Visits           1 month ago Acute recurrent pansinusitis   [facility] [place], Clearnce Sorrel, PA-C   1 month ago No-show for appointment   [facility], [place], [place]   5 months ago Acute non-recurrent pansinusitis   [facility] [place] M[place], [place]   10 months ago Type I or II open fracture of right ankle with routine healing, subsequent encounter   [place], [place]   1 year ago Acute pansinusitis, recurrence not specified   [place], Vickki Muff, PA-C                  oxyCODONE-acetaminophen (ENDOCET) 7.5-325 MG tablet 60 tablet 0    Sig: Take 1 tablet by mouth 2 (two) times daily.      Not Delegated - Analgesics:  Opioid Agonist Combinations Failed - 05/10/2020  9:54 AM      Failed - This refill cannot be delegated      Failed - Urine Drug Screen completed in last 360 days      Passed - Valid encounter within last 6 months    Recent Outpatient Visits           1 month ago Acute recurrent pansinusitis   [facility] [place], Clearnce Sorrel, PA-C   1 month ago No-show for appointment   [place], [place]   5 months ago Acute non-recurrent pansinusitis   [facility] [place], [place]   10 months ago Type I or II open fracture of right ankle with routine healing, subsequent encounter   [place], [place]   1 year ago Acute pansinusitis, recurrence not specified   [place], Vickki Muff, [place]

## 2020-05-11 ENCOUNTER — Other Ambulatory Visit: Payer: Self-pay | Admitting: Physician Assistant

## 2020-05-11 DIAGNOSIS — J0140 Acute pansinusitis, unspecified: Secondary | ICD-10-CM

## 2020-05-11 DIAGNOSIS — J4520 Mild intermittent asthma, uncomplicated: Secondary | ICD-10-CM

## 2020-05-11 NOTE — Telephone Encounter (Signed)
No future visit at this time  

## 2020-05-22 ENCOUNTER — Ambulatory Visit: Payer: Self-pay | Admitting: *Deleted

## 2020-05-22 NOTE — Telephone Encounter (Signed)
Having dizziness with vertigo for 3 weeks.Occurs mostly in the am upon wakening but can occur thru out the day. Occasioal blurred vision.Denies CP/SOB/fever. Recent sinus infection-feels some sinus pressure still. Reviewed Care Advice with the patient. She will increase her water intake also. Reported she has anxiety at times and is unsure if this could be presenting from anxiety. Stated she quit taking Adderall total of 60 mg daily one week ago b/c of the dizziness.  Patient is requesting a cpe with labs for next week. Offered earlier visit for dizziness with other provider-declined. No availability with [place] next week. Routing to clinic.  Reason for Disposition . [1] MILD dizziness (e.g., walking normally) AND [2] has NOT been evaluated by physician for this  (Exception: dizziness caused by heat exposure, sudden standing, or poor fluid intake)  Answer Assessment - Initial Assessment Questions 1. DESCRIPTION: "Describe your dizziness."     Room spinning feeling 2. LIGHTHEADED: "Do you feel lightheaded?" (e.g., somewhat faint, woozy, weak upon standing)      3. VERTIGO: "Do you feel like either you or the room is spinning or tilting?" (i.e. vertigo)     yes 4. SEVERITY: "How bad is it?"  "Do you feel like you are going to faint?" "Can you stand and walk?"   - MILD: Feels slightly dizzy, but walking normally.   - MODERATE: Feels very unsteady when walking, but not falling; interferes with normal activities (e.g., school, work) .   - SEVERE: Unable to walk without falling, or requires assistance to walk without falling; feels like passing out now.      mild 5. ONSET:  "When did the dizziness begin?"     3 weeks ago 6. AGGRAVATING FACTORS: "Does anything make it worse?" (e.g., standing, change in head position)     Nothing in particular 7. HEART RATE: "Can you tell me your heart rate?" "How many beats in 15 seconds?"  (Note: not all patients can do this)      No 8. CAUSE: "What do you think  is causing the dizziness?"     Maybe anxiety 9. RECURRENT SYMPTOM: "Have you had dizziness before?" If Yes, ask: "When was the last time?" "What happened that time?"     no 10. OTHER SYMPTOMS: "Do you have any other symptoms?" (e.g., fever, chest pain, vomiting, diarrhea, bleeding)       Sinus pressure 11. PREGNANCY: "Is there any chance you are pregnant?" "When was your last menstrual period?"       na  Protocols used: DIZZINESS [facility]

## 2020-05-23 NOTE — Telephone Encounter (Signed)
Apt 06/01/2020 at 11am  Thanks,   -Mickel Baas

## 2020-06-01 ENCOUNTER — Encounter: Payer: Self-pay | Admitting: Physician Assistant

## 2020-06-01 ENCOUNTER — Other Ambulatory Visit: Payer: Self-pay

## 2020-06-01 ENCOUNTER — Ambulatory Visit (INDEPENDENT_AMBULATORY_CARE_PROVIDER_SITE_OTHER): Payer: Medicare Other | Admitting: Physician Assistant

## 2020-06-01 VITALS — BP 115/76 | HR 59 | Temp 97.3°F | Resp 16 | Wt 215.0 lb

## 2020-06-01 DIAGNOSIS — E010 Iodine-deficiency related diffuse (endemic) goiter: Secondary | ICD-10-CM | POA: Diagnosis not present

## 2020-06-01 DIAGNOSIS — Z1211 Encounter for screening for malignant neoplasm of colon: Secondary | ICD-10-CM

## 2020-06-01 DIAGNOSIS — J0141 Acute recurrent pansinusitis: Secondary | ICD-10-CM

## 2020-06-01 DIAGNOSIS — R7303 Prediabetes: Secondary | ICD-10-CM | POA: Diagnosis not present

## 2020-06-01 DIAGNOSIS — Z833 Family history of diabetes mellitus: Secondary | ICD-10-CM

## 2020-06-01 DIAGNOSIS — R42 Dizziness and giddiness: Secondary | ICD-10-CM | POA: Diagnosis not present

## 2020-06-01 DIAGNOSIS — E7800 Pure hypercholesterolemia, unspecified: Secondary | ICD-10-CM

## 2020-06-01 DIAGNOSIS — J418 Mixed simple and mucopurulent chronic bronchitis: Secondary | ICD-10-CM

## 2020-06-01 DIAGNOSIS — Z0000 Encounter for general adult medical examination without abnormal findings: Secondary | ICD-10-CM | POA: Diagnosis not present

## 2020-06-01 DIAGNOSIS — Z1239 Encounter for other screening for malignant neoplasm of breast: Secondary | ICD-10-CM

## 2020-06-01 DIAGNOSIS — J324 Chronic pansinusitis: Secondary | ICD-10-CM | POA: Diagnosis not present

## 2020-06-01 DIAGNOSIS — F322 Major depressive disorder, single episode, severe without psychotic features: Secondary | ICD-10-CM

## 2020-06-01 MED ORDER — ONETOUCH VERIO VI STRP: ORAL_STRIP | 12 refills | Status: DC

## 2020-06-01 MED ORDER — PREDNISONE 20 MG PO TABS: 20.0000 mg | ORAL_TABLET | Freq: Every day | ORAL | 0 refills | Status: DC

## 2020-06-01 MED ORDER — ONETOUCH ULTRASOFT LANCETS MISC: 12 refills | Status: AC

## 2020-06-01 MED ORDER — ONETOUCH VERIO W/DEVICE KIT: PACK | 0 refills | Status: DC

## 2020-06-01 MED ORDER — AMOXICILLIN-POT CLAVULANATE 875-125 MG PO TABS: 1.0000 | ORAL_TABLET | Freq: Two times a day (BID) | ORAL | 0 refills | Status: DC

## 2020-06-01 NOTE — Progress Notes (Signed)
Established patient visit   Patient: Kathleen Buchanan   DOB: 1965-10-17   55 y.o. Female  MRN: [id number] Visit Date: 06/01/2020  Today's healthcare provider: Mar Daring, PA-C   Chief Complaint  Patient presents with  . Dizziness   Subjective    Dizziness This is a new problem. Episode onset: 4 weeks ago. The problem has been unchanged. Associated symptoms include fatigue, headaches (left frontal), joint swelling (in hands), vertigo (off balance with ambulation ) and a visual change (occasional blurred vision). Pertinent negatives include no abdominal pain, chest pain, chills, congestion, coughing, diaphoresis, fever, nausea, vomiting or weakness.   Patient states "it feels like the room is spinning when I lie down".   Patient Active Problem List   Diagnosis Date Noted  . Menopausal syndrome (hot flashes) 08/13/2016  . Tobacco dependence 08/13/2016  . Family history of diabetes mellitus in mother 07/10/2016  . Chronic low back pain 11/08/2014  . Onychomycosis 09/15/2014  . ADD (attention deficit disorder) 09/08/2014  . Ankle pain 09/08/2014  . Ache in joint 09/08/2014  . Anxiety 09/08/2014  . Airway hyperreactivity 09/08/2014  . Ankle fracture 09/08/2014  . Back ache 09/08/2014  . Bursitis of hip 09/08/2014  . Chronic anemia 09/08/2014  . Chronic constipation 09/08/2014  . CAFL (chronic airflow limitation) ([place]) 09/08/2014  . Accumulation of fluid in tissues 09/08/2014  . Biliary sludge 09/08/2014  . Acid reflux 09/08/2014  . History of migraine headaches 09/08/2014  . Hypercholesteremia 09/08/2014  . Adiposity 09/08/2014  . Episodic paroxysmal anxiety disorder 09/08/2014  . Scoliosis 09/08/2014  . Tendinitis of wrist 09/08/2014  . Big thyroid 09/08/2014  . Current tobacco use 09/08/2014   Past Medical History:  Diagnosis Date  . Asthma   . COPD (chronic obstructive pulmonary disease) (HCC)        Medications: Outpatient Medications Prior to  Visit  Medication Sig  . acetaminophen (TYLENOL) 500 MG tablet Take 1,000 mg by mouth every 6 (six) hours as needed.   Marland Kitchen albuterol (VENTOLIN HFA) 108 (90 Base) MCG/ACT inhaler INHALE 2 PUFFS INTO THE LUNGS EVERY 4 HOURS AS NEEDED FOR WHEEZE OR FOR SHORTNESS OF BREATH  . ALPRAZolam (XANAX) 1 MG tablet TAKE 1 TABLET BY MOUTH TWICE A DAY  . amphetamine-dextroamphetamine (ADDERALL) 30 MG tablet Take 1 tablet by mouth 2 (two) times daily. (Patient not taking: Reported on 06/01/2020)  . ascorbic acid (VITAMIN C) 500 MG tablet Take 1,000 mg by mouth daily.   . Aspirin-Caffeine (BC FAST PAIN RELIEF PO) Take by mouth as needed.  . baclofen (LIORESAL) 10 MG tablet Take 1 tablet (10 mg total) by mouth 3 (three) times daily.  . cetirizine (ZYRTEC) 10 MG tablet Take 10 mg by mouth daily.  . hydrochlorothiazide (HYDRODIURIL) 12.5 MG tablet Take 1 tablet (12.5 mg total) by mouth daily.  Marland Kitchen ibuprofen (ADVIL,MOTRIN) 200 MG tablet Take 4 tablets by mouth as needed.  . Multiple Vitamin (MULTIVITAMIN) capsule Take 1 capsule by mouth daily.  . Multiple Vitamins-Minerals (HAIR SKIN AND NAILS FORMULA PO) Take 1 tablet by mouth daily.  Marland Kitchen oxyCODONE-acetaminophen (ENDOCET) 7.5-325 MG tablet Take 1 tablet by mouth 2 (two) times daily.  . pantoprazole (PROTONIX) 40 MG tablet Take 1 tablet (40 mg total) by mouth daily.  . polyethylene glycol powder (GLYCOLAX/MIRALAX) powder Take 17 g by mouth 2 (two) times daily as needed.  . [DISCONTINUED] cefdinir (OMNICEF) 300 MG capsule Take 1 capsule (300 mg total) by mouth 2 (two) times  daily. (Patient not taking: Reported on 06/01/2020)   No facility-administered medications prior to visit.    Review of Systems  Constitutional: Positive for fatigue. Negative for appetite change, chills, diaphoresis and fever.  HENT: Positive for ear pain and sinus pressure. Negative for congestion.        Ear congestion-left ear worse  Eyes: Positive for visual disturbance (occasional blurred  vision).  Respiratory: Negative for cough, chest tightness and shortness of breath.   Cardiovascular: Negative for chest pain and palpitations.  Gastrointestinal: Negative for abdominal pain, nausea and vomiting.  Musculoskeletal: Positive for joint swelling (in hands).  Neurological: Positive for dizziness, vertigo (off balance with ambulation ) and headaches (left frontal). Negative for weakness.    Last CBC Lab Results  Component Value Date   WBC 5.6 01/16/2018   HGB 14.6 01/16/2018   HCT 43.2 01/16/2018   MCV 94 01/16/2018   MCH 31.7 01/16/2018   RDW 13.6 01/16/2018   PLT 174 01/16/2018   Last metabolic panel Lab Results  Component Value Date   GLUCOSE 76 01/16/2018   NA 139 01/16/2018   K 4.0 01/16/2018   CL 101 01/16/2018   CO2 25 01/16/2018   BUN 17 01/16/2018   CREATININE 0.82 01/16/2018   GFRNONAA 83 01/16/2018   GFRAA 95 01/16/2018   CALCIUM 9.2 01/16/2018   PROT 6.8 01/16/2018   ALBUMIN 4.4 01/16/2018   LABGLOB 2.4 01/16/2018   AGRATIO 1.8 01/16/2018   BILITOT 0.3 01/16/2018   ALKPHOS 99 01/16/2018   AST 21 01/16/2018   ALT 19 01/16/2018       Objective    BP 115/76 (BP Location: Right Arm, Patient Position: Sitting, Cuff Size: Large)   Pulse (!) 59   Temp (!) 97.3 F (36.3 C) (Temporal)   Resp 16   Wt 215 lb (97.5 kg)   SpO2 99% Comment: room air  BMI 40.62 kg/m  BP Readings from Last 3 Encounters:  06/01/20 115/76  07/06/19 113/72  07/02/19 105/69   Wt Readings from Last 3 Encounters:  06/01/20 215 lb (97.5 kg)  07/06/19 199 lb (90.3 kg)  02/12/17 208 lb (94.3 kg)       Physical Exam Vitals reviewed.  Constitutional:      General: She is not in acute distress.    Appearance: Normal appearance. She is well-developed. She is obese. She is not ill-appearing.  HENT:     Head: Normocephalic and atraumatic.     Right Ear: Hearing, tympanic membrane, ear canal and external ear normal.     Left Ear: Hearing, tympanic membrane, ear canal  and external ear normal.     Nose: Nose normal.     Mouth/Throat:     Mouth: Mucous membranes are moist.     Pharynx: Oropharynx is clear. No oropharyngeal exudate.  Eyes:     General:        Right eye: No discharge.        Left eye: No discharge.     Extraocular Movements: Extraocular movements intact.     Conjunctiva/sclera: Conjunctivae normal.     Pupils: Pupils are equal, round, and reactive to light.  Neck:     Thyroid: No thyromegaly.     Vascular: No carotid bruit or JVD.     Trachea: No tracheal deviation.     Meningeal: Brudzinski's sign and Kernig's sign absent.  Cardiovascular:     Rate and Rhythm: Normal rate and regular rhythm.     Pulses: Normal pulses.       Heart sounds: Normal heart sounds. No murmur heard. No friction rub. No gallop.   Pulmonary:     Effort: Pulmonary effort is normal. No respiratory distress.     Breath sounds: Normal breath sounds. No stridor. No wheezing or rales.  Chest:     Chest wall: No tenderness.  Abdominal:     General: Abdomen is flat. Bowel sounds are normal.  Musculoskeletal:     Cervical back: Normal range of motion and neck supple. No tenderness.  Lymphadenopathy:     Cervical: No cervical adenopathy.  Skin:    General: Skin is warm and dry.  Neurological:     General: No focal deficit present.     Mental Status: She is alert. Mental status is at baseline.     Cranial Nerves: No cranial nerve deficit.     Motor: No weakness.     Gait: Gait normal.  Psychiatric:        Mood and Affect: Mood normal.        Thought Content: Thought content normal.     No results found for any visits on 06/01/20.  Assessment & Plan     1. Annual physical exam Normal physical exam today. Will check labs as below and f/u pending lab results. If labs are stable and WNL she will not need to have these rechecked for one year at her next annual physical exam. She is to call the office in the meantime if she has any acute issue, questions or  concerns. - CBC w/Diff/Platelet - Comprehensive Metabolic Panel (CMET) - TSH - Lipid Panel With LDL/HDL Ratio - HgB A1c - Vitamin D (25 hydroxy)  2. Encounter for breast cancer screening using non-mammogram modality There is no family history of breast cancer. She does perform regular self breast exams. Mammogram was ordered as below. Information for [facility] clinic was given to patient so she may schedule her mammogram at her convenience.  3. Colon cancer screening Patient declines at this time.   4. Mixed simple and mucopurulent chronic bronchitis (HCC) Stable. Continue inhalers.  5. Depression, major, single episode, severe (HCC) Stable. Continue medications as prescribed.  6. Dizziness Acute issue. Suspect ETD and BPPV. Will check labs as below and f/u pending results. - CBC w/Diff/Platelet - Comprehensive Metabolic Panel (CMET) - TSH  7. Hypercholesterolemia Stable. Continue rosuvastatin 15m. Will check labs as below and f/u pending results. - Lipid Panel With LDL/HDL Ratio - Vitamin D (25 hydroxy)  8. Big thyroid Will check labs as below and f/u pending results. - TSH  9. Family history of diabetes mellitus in mother Meter ordered as below for patient to monitor. She is prediabetic. - Blood Glucose Monitoring Suppl (ONETOUCH VERIO) w/Device KIT; To check blood sugar daily for diabetes  Dispense: 1 kit; Refill: 0 - glucose blood (ONETOUCH VERIO) test strip; To check blood sugar daily for diabetes  Dispense: 100 each; Refill: 12 - Lancets (ONETOUCH ULTRASOFT) lancets; To check blood sugar daily for diabetes  Dispense: 100 each; Refill: 12  10. Prediabetes Will check labs as below and f/u pending results. - Blood Glucose Monitoring Suppl (ONETOUCH VERIO) w/Device KIT; To check blood sugar daily for diabetes  Dispense: 1 kit; Refill: 0 - glucose blood (ONETOUCH VERIO) test strip; To check blood sugar daily for diabetes  Dispense: 100 each; Refill: 12 - Lancets  (ONETOUCH ULTRASOFT) lancets; To check blood sugar daily for diabetes  Dispense: 100 each; Refill: 12  11. Acute recurrent pansinusitis Worsening symptoms that have  not responded to OTC medications. Will give augmentin and prednisone as below. Continue allergy medications. Stay well hydrated and get plenty of rest. Call if no symptom improvement or if symptoms worsen. - amoxicillin-clavulanate (AUGMENTIN) 875-125 MG tablet; Take 1 tablet by mouth 2 (two) times daily.  Dispense: 20 tablet; Refill: 0 - predniSONE (DELTASONE) 20 MG tablet; Take 1 tablet (20 mg total) by mouth daily with breakfast.  Dispense: 10 tablet; Refill: 0  12. Chronic pansinusitis Will refer to ENT for chronic sinus issues. Gets about 4 sinus infections per year. Has chronic pressure/pain. Also having dizziness, most likely secondary to pressure and fluid (ETD) and possible BPPV.  - Ambulatory referral to ENT   No follow-ups on file.      I, Jennifer M Burnette, PA-C, have reviewed all documentation for this visit. The documentation on 06/01/20 for the exam, diagnosis, procedures, and orders are all accurate and complete.   Jennifer M Burnette, PA-C  [facility] [phone] (phone) [phone] (fax)  [facility] 

## 2020-06-01 NOTE — Patient Instructions (Signed)
Preventive Care 84-55 Years Old, Female Preventive care refers to lifestyle choices and visits with your health care provider that can promote health and wellness. This includes:  A yearly physical exam. This is also called an annual wellness visit.  Regular dental and eye exams.  Immunizations.  Screening for certain conditions.  Healthy lifestyle choices, such as: ? Eating a healthy diet. ? Getting regular exercise. ? Not using drugs or products that contain nicotine and tobacco. ? Limiting alcohol use. What can I expect for my preventive care visit? Physical exam Your health care provider will check your:  Height and weight. These may be used to calculate your BMI (body mass index). BMI is a measurement that tells if you are at a healthy weight.  Heart rate and blood pressure.  Body temperature.  Skin for abnormal spots. Counseling Your health care provider may ask you questions about your:  Past medical problems.  Family's medical history.  Alcohol, tobacco, and drug use.  Emotional well-being.  Home life and relationship well-being.  Sexual activity.  Diet, exercise, and sleep habits.  Work and work Statistician.  Access to firearms.  Method of birth control.  Menstrual cycle.  Pregnancy history. What immunizations do I need? Vaccines are usually given at various ages, according to a schedule. Your health care provider will recommend vaccines for you based on your age, medical history, and lifestyle or other factors, such as travel or where you work.   What tests do I need? Blood tests  Lipid and cholesterol levels. These may be checked every 5 years, or more often if you are over 3 years old.  Hepatitis C test.  Hepatitis B test. Screening  Lung cancer screening. You may have this screening every year starting at age 73 if you have a 30-pack-year history of smoking and currently smoke or have quit within the past 15 years.  Colorectal cancer  screening. ? All adults should have this screening starting at age 52 and continuing until age 17. ? Your health care provider may recommend screening at age 49 if you are at increased risk. ? You will have tests every 1-10 years, depending on your results and the type of screening test.  Diabetes screening. ? This is done by checking your blood sugar (glucose) after you have not eaten for a while (fasting). ? You may have this done every 1-3 years.  Mammogram. ? This may be done every 1-2 years. ? Talk with your health care provider about when you should start having regular mammograms. This may depend on whether you have a family history of breast cancer.  BRCA-related cancer screening. This may be done if you have a family history of breast, ovarian, tubal, or peritoneal cancers.  Pelvic exam and Pap test. ? This may be done every 3 years starting at age 10. ? Starting at age 11, this may be done every 5 years if you have a Pap test in combination with an HPV test. Other tests  STD (sexually transmitted disease) testing, if you are at risk.  Bone density scan. This is done to screen for osteoporosis. You may have this scan if you are at high risk for osteoporosis. Talk with your health care provider about your test results, treatment options, and if necessary, the need for more tests. Follow these instructions at home: Eating and drinking  Eat a diet that includes fresh fruits and vegetables, whole grains, lean protein, and low-fat dairy products.  Take vitamin and mineral supplements  as recommended by your health care provider.  Do not drink alcohol if: ? Your health care provider tells you not to drink. ? You are pregnant, may be pregnant, or are planning to become pregnant.  If you drink alcohol: ? Limit how much you have to 0-1 drink a day. ? Be aware of how much alcohol is in your drink. In the [place] one drink equals one 12 oz bottle of beer (355 mL), one 5 oz glass of  wine (148 mL), or one 1 oz glass of hard liquor (44 mL).   Lifestyle  Take daily care of your teeth and gums. Brush your teeth every morning and night with fluoride toothpaste. Floss one time each day.  Stay active. Exercise for at least 30 minutes 5 or more days each week.  Do not use any products that contain nicotine or tobacco, such as cigarettes, e-cigarettes, and chewing tobacco. If you need help quitting, ask your health care provider.  Do not use drugs.  If you are sexually active, practice safe sex. Use a condom or other form of protection to prevent STIs (sexually transmitted infections).  If you do not wish to become pregnant, use a form of birth control. If you plan to become pregnant, see your health care provider for a prepregnancy visit.  If told by your health care provider, take low-dose aspirin daily starting at age 50.  Find healthy ways to cope with stress, such as: ? Meditation, yoga, or listening to music. ? Journaling. ? Talking to a trusted person. ? Spending time with friends and family. Safety  Always wear your seat belt while driving or riding in a vehicle.  Do not drive: ? If you have been drinking alcohol. Do not ride with someone who has been drinking. ? When you are tired or distracted. ? While texting.  Wear a helmet and other protective equipment during sports activities.  If you have firearms in your house, make sure you follow all gun safety procedures. What's next?  Visit your health care provider once a year for an annual wellness visit.  Ask your health care provider how often you should have your eyes and teeth checked.  Stay up to date on all vaccines. This information is not intended to replace advice given to you by your health care provider. Make sure you discuss any questions you have with your health care provider. Document Revised: 11/30/2019 Document Reviewed: 11/06/2017 Elsevier Patient Education  2021 Elsevier Inc.  

## 2020-06-02 ENCOUNTER — Telehealth: Payer: Self-pay

## 2020-06-02 LAB — COMPREHENSIVE METABOLIC PANEL
ALT: 20 IU/L (ref 0–32)
AST: 22 IU/L (ref 0–40)
Albumin/Globulin Ratio: 1.7 (ref 1.2–2.2)
Albumin: 4.6 g/dL (ref 3.8–4.9)
Alkaline Phosphatase: 116 IU/L (ref 44–121)
BUN/Creatinine Ratio: 16 (ref 9–23)
BUN: 12 mg/dL (ref 6–24)
Bilirubin Total: 0.3 mg/dL (ref 0.0–1.2)
CO2: 23 mmol/L (ref 20–29)
Calcium: 9.2 mg/dL (ref 8.7–10.2)
Chloride: 102 mmol/L (ref 96–106)
Creatinine, Ser: 0.75 mg/dL (ref 0.57–1.00)
Globulin, Total: 2.7 g/dL (ref 1.5–4.5)
Glucose: 89 mg/dL (ref 65–99)
Potassium: 4 mmol/L (ref 3.5–5.2)
Sodium: 141 mmol/L (ref 134–144)
Total Protein: 7.3 g/dL (ref 6.0–8.5)
eGFR: 94 mL/min/{1.73_m2} (ref >59)

## 2020-06-02 LAB — TSH: TSH: 12.3 u[IU]/mL — ABNORMAL HIGH (ref 0.450–4.500)

## 2020-06-02 LAB — CBC WITH DIFFERENTIAL/PLATELET
Basophils Absolute: 0.1 10*3/uL (ref 0.0–0.2)
Basos: 1 %
EOS (ABSOLUTE): 0.2 10*3/uL (ref 0.0–0.4)
Eos: 3 %
Hematocrit: 42.3 % (ref 34.0–46.6)
Hemoglobin: 14.9 g/dL (ref 11.1–15.9)
Immature Grans (Abs): 0 10*3/uL (ref 0.0–0.1)
Immature Granulocytes: 0 %
Lymphocytes Absolute: 1.8 10*3/uL (ref 0.7–3.1)
Lymphs: 24 %
MCH: 34.9 pg — ABNORMAL HIGH (ref 26.6–33.0)
MCHC: 35.2 g/dL (ref 31.5–35.7)
MCV: 99 fL — ABNORMAL HIGH (ref 79–97)
Monocytes Absolute: 0.3 10*3/uL (ref 0.1–0.9)
Monocytes: 5 %
Neutrophils Absolute: 4.8 10*3/uL (ref 1.4–7.0)
Neutrophils: 67 %
Platelets: 175 10*3/uL (ref 150–450)
RBC: 4.27 x10E6/uL (ref 3.77–5.28)
RDW: 13.8 % (ref 11.7–15.4)
WBC: 7.2 10*3/uL (ref 3.4–10.8)

## 2020-06-02 LAB — LIPID PANEL WITH LDL/HDL RATIO
Cholesterol, Total: 265 mg/dL — ABNORMAL HIGH (ref 100–199)
HDL: 38 mg/dL — ABNORMAL LOW (ref >39)
LDL Chol Calc (NIH): 191 mg/dL — ABNORMAL HIGH (ref 0–99)
LDL/HDL Ratio: 5 ratio — ABNORMAL HIGH (ref 0.0–3.2)
Triglycerides: 190 mg/dL — ABNORMAL HIGH (ref 0–149)
VLDL Cholesterol Cal: 36 mg/dL (ref 5–40)

## 2020-06-02 LAB — VITAMIN D 25 HYDROXY (VIT D DEFICIENCY, FRACTURES): Vit D, 25-Hydroxy: 14.1 ng/mL — ABNORMAL LOW (ref 30.0–100.0)

## 2020-06-02 LAB — HEMOGLOBIN A1C
Est. average glucose Bld gHb Est-mCnc: 114 mg/dL
Hgb A1c MFr Bld: 5.6 % (ref 4.8–5.6)

## 2020-06-02 NOTE — Telephone Encounter (Signed)
Called patient and no answer,LVMTRC. If patient calls back okay for PEC to advise of results.

## 2020-06-02 NOTE — Telephone Encounter (Signed)
-----   Message from Mar Daring, [place] sent at 06/02/2020  1:02 PM EDT ----- [name],  Thyroid is under active. Would recommend to start levothyroxine. If agreeable, I will send in. Cholesterol also increased compared to last year. Currently your risk of having a cardiovascular event over the next 10 years is moderately elevated at 7.4%. Would recommend cholesterol lowering medication. Also Vit D is low. Can take Vit D OTC 2000 IU daily.   Best Wishes,  Grace Bushy, PAC

## 2020-06-05 ENCOUNTER — Ambulatory Visit: Payer: Self-pay | Admitting: *Deleted

## 2020-06-05 NOTE — Telephone Encounter (Signed)
Pt called in and was given her lab result message from Fenton Malling, PA-C dated 06/02/2020 at 1:02 PM.  She was agreeable to starting the medications for elevated cholesterol and for her thyroid.   I let her know they would be called into her pharmacy.    She is requesting a dermatologist referral.   She has some sagging areas she thinks may be related to the high cholesterol level she would like to have removed.  She did not have a preference in a doctor so whoever Anderson Malta recommends is fine. I let her know the referral would be put in and the dermatologist office would contact her for an appt.    If she has not heard from them in 2 wks to please call and and let us know.  She verbalized understanding to start taking the 2,000 IU of vitamin D daily.

## 2020-06-07 ENCOUNTER — Ambulatory Visit: Payer: Medicare Other | Admitting: Physician Assistant

## 2020-06-07 ENCOUNTER — Telehealth: Payer: Self-pay | Admitting: Physician Assistant

## 2020-06-07 DIAGNOSIS — M778 Other enthesopathies, not elsewhere classified: Secondary | ICD-10-CM

## 2020-06-07 DIAGNOSIS — E034 Atrophy of thyroid (acquired): Secondary | ICD-10-CM

## 2020-06-07 DIAGNOSIS — F900 Attention-deficit hyperactivity disorder, predominantly inattentive type: Secondary | ICD-10-CM

## 2020-06-07 DIAGNOSIS — E7800 Pure hypercholesterolemia, unspecified: Secondary | ICD-10-CM

## 2020-06-07 MED ORDER — ROSUVASTATIN CALCIUM 10 MG PO TABS: 10.0000 mg | ORAL_TABLET | Freq: Every day | ORAL | 3 refills | Status: DC

## 2020-06-07 MED ORDER — LEVOTHYROXINE SODIUM 25 MCG PO TABS: 25.0000 ug | ORAL_TABLET | Freq: Every day | ORAL | 1 refills | Status: DC

## 2020-06-07 MED ORDER — OXYCODONE-ACETAMINOPHEN 7.5-325 MG PO TABS: 1.0000 | ORAL_TABLET | Freq: Two times a day (BID) | ORAL | 0 refills | Status: DC

## 2020-06-07 MED ORDER — AMPHETAMINE-DEXTROAMPHETAMINE 30 MG PO TABS: 1.0000 | ORAL_TABLET | Freq: Two times a day (BID) | ORAL | 0 refills | Status: DC

## 2020-06-07 NOTE — Telephone Encounter (Signed)
Refilled meds and added levothyroxine and rosuvastatin

## 2020-06-07 NOTE — Telephone Encounter (Signed)
Pt states jennifer was going to start her on levothyroxine and cholesterol medication.  Pt states she does not want to try Lipitor, but an alternate.    Also could she call in her  oxyCODONE-acetaminophen (ENDOCET) 7.5-325 MG tablet   amphetamine-dextroamphetamine (ADDERALL) 30 MG tablet  Since Anderson Malta will be leaving.   [id number] - [place], [place]

## 2020-06-16 ENCOUNTER — Encounter: Payer: Self-pay | Admitting: Physician Assistant

## 2020-06-18 ENCOUNTER — Other Ambulatory Visit: Payer: Self-pay | Admitting: Physician Assistant

## 2020-06-18 DIAGNOSIS — J0141 Acute recurrent pansinusitis: Secondary | ICD-10-CM

## 2020-06-18 DIAGNOSIS — J4520 Mild intermittent asthma, uncomplicated: Secondary | ICD-10-CM

## 2020-06-18 NOTE — Telephone Encounter (Signed)
Requested medication (s) are due for refill today: -  Requested medication (s) are on the active medication list: yes  Last refill:  06/01/20  Future visit scheduled: no  Notes to clinic:  med. Not assigned a protocol   Requested Prescriptions  Pending Prescriptions Disp Refills   amoxicillin-clavulanate (AUGMENTIN) 875-125 MG tablet [Pharmacy Med Name: AMOXICILLIN-CLAV 875-125MG  TAB] 30 tablet     Sig: TAKE 1 TABLET BY MOUTH TWICE A DAY      Off-Protocol Failed - 06/18/2020  1:18 PM      Failed - Medication not assigned to a protocol, review manually.      Passed - Valid encounter within last 12 months    Recent Outpatient Visits           2 weeks ago Annual physical exam   [facility] Fenton Malling M, [place]   3 months ago Acute recurrent pansinusitis   [facility] [place], Clearnce Sorrel, [place]   3 months ago No-show for appointment   [facility], Anderson Malta M, [place]   6 months ago Acute non-recurrent pansinusitis   [facility] Fenton Malling M, [place]   11 months ago Type I or II open fracture of right ankle with routine healing, subsequent encounter   [facility] Fenton Malling M, [place]                 Signed Prescriptions Disp Refills   albuterol (VENTOLIN HFA) 108 (90 Base) MCG/ACT inhaler 8.5 each 0    Sig: INHALE 2 PUFFS INTO THE LUNGS EVERY 4 HOURS AS NEEDED FOR WHEEZE OR FOR SHORTNESS OF BREATH      Pulmonology:  Beta Agonists Failed - 06/18/2020  1:18 PM      Failed - One inhaler should last at least one month. If the patient is requesting refills earlier, contact the patient to check for uncontrolled symptoms.      Passed - Valid encounter within last 12 months    Recent Outpatient Visits           2 weeks ago Annual physical exam   [place], [place]   3 months ago Acute recurrent pansinusitis   [place], [place]   3 months ago No-show for appointment   [place], [place]   6 months ago Acute non-recurrent pansinusitis   [place], [place]   11 months ago Type I or II open fracture of right ankle with routine healing, subsequent encounter   [facility], [place], [place]

## 2020-06-18 NOTE — Telephone Encounter (Signed)
Requested Prescriptions  Pending Prescriptions Disp Refills  . amoxicillin-clavulanate (AUGMENTIN) 875-125 MG tablet [Pharmacy Med Name: AMOXICILLIN-CLAV 875-125MG  TAB] 30 tablet     Sig: TAKE 1 TABLET BY MOUTH TWICE A DAY     Off-Protocol Failed - 06/18/2020  1:18 PM      Failed - Medication not assigned to a protocol, review manually.      Passed - Valid encounter within last 12 months    Recent Outpatient Visits          2 weeks ago Annual physical exam   [facility] Fenton Malling M, [place]   3 months ago Acute recurrent pansinusitis   [facility] [place], Clearnce Sorrel, [place]   3 months ago No-show for appointment   [facility], Anderson Malta M, [place]   6 months ago Acute non-recurrent pansinusitis   [facility] Fenton Malling M, [place]   11 months ago Type I or II open fracture of right ankle with routine healing, subsequent encounter   [facility] Anderson Malta M, [place]             . albuterol (VENTOLIN HFA) 108 (90 Base) MCG/ACT inhaler [Pharmacy Med Name: ALBUTEROL HFA (PROAIR) INHALER] 8.5 each 0    Sig: INHALE 2 PUFFS INTO THE LUNGS EVERY 4 HOURS AS NEEDED FOR WHEEZE OR FOR SHORTNESS OF BREATH     Pulmonology:  Beta Agonists Failed - 06/18/2020  1:18 PM      Failed - One inhaler should last at least one month. If the patient is requesting refills earlier, contact the patient to check for uncontrolled symptoms.      Passed - Valid encounter within last 12 months    Recent Outpatient Visits          2 weeks ago Annual physical exam   [place], [place]   3 months ago Acute recurrent pansinusitis   [place], [place]   3 months ago No-show for appointment   [place], [place]   6 months ago Acute non-recurrent pansinusitis   [place], [place]    11 months ago Type I or II open fracture of right ankle with routine healing, subsequent encounter   [facility] Mar Daring, [place]             '

## 2020-07-10 ENCOUNTER — Other Ambulatory Visit: Payer: Self-pay | Admitting: Physician Assistant

## 2020-07-10 DIAGNOSIS — M778 Other enthesopathies, not elsewhere classified: Secondary | ICD-10-CM

## 2020-07-10 NOTE — Telephone Encounter (Signed)
Requested medication (s) are due for refill today:   Provider to review  Former [facility] pt  Requested medication (s) are on the active medication list:   Yes  Future visit scheduled:   No   Last ordered: 06/07/2020 #60, 0 refills  Non delegated refill and former [place] pt.   Requested Prescriptions  Pending Prescriptions Disp Refills   oxyCODONE-acetaminophen (ENDOCET) 7.5-325 MG tablet 60 tablet 0    Sig: Take 1 tablet by mouth 2 (two) times daily.      Not Delegated - Analgesics:  Opioid Agonist Combinations Failed - 07/10/2020 10:35 AM      Failed - This refill cannot be delegated      Failed - Urine Drug Screen completed in last 360 days      Passed - Valid encounter within last 6 months    Recent Outpatient Visits           1 month ago Annual physical exam   [facility] Fenton Malling M, [place]   3 months ago Acute recurrent pansinusitis   [facility] [place], Clearnce Sorrel, [place]   3 months ago No-show for appointment   [place], [place]   7 months ago Acute non-recurrent pansinusitis   [facility] [place], [place], [place]   1 year ago Type I or II open fracture of right ankle with routine healing, subsequent encounter   [facility], Clearnce Sorrel, [place]

## 2020-07-10 NOTE — Telephone Encounter (Signed)
Medication Refill - Medication: oxyCODONE-acetaminophen (ENDOCET) 7.5-325 MG tablet     Preferred Pharmacy (with phone number or street name): [id number] [place] [place] Phone:  [phone]  Fax:  [phone]       Agent: Please be advised that RX refills may take up to 3 business days. We ask that you follow-up with your pharmacy.

## 2020-07-12 ENCOUNTER — Other Ambulatory Visit: Payer: Self-pay | Admitting: Physician Assistant

## 2020-07-12 ENCOUNTER — Other Ambulatory Visit: Payer: Self-pay | Admitting: Family Medicine

## 2020-07-12 DIAGNOSIS — K219 Gastro-esophageal reflux disease without esophagitis: Secondary | ICD-10-CM

## 2020-07-12 DIAGNOSIS — R609 Edema, unspecified: Secondary | ICD-10-CM

## 2020-07-12 DIAGNOSIS — J0141 Acute recurrent pansinusitis: Secondary | ICD-10-CM

## 2020-07-12 DIAGNOSIS — J4520 Mild intermittent asthma, uncomplicated: Secondary | ICD-10-CM

## 2020-07-12 DIAGNOSIS — F900 Attention-deficit hyperactivity disorder, predominantly inattentive type: Secondary | ICD-10-CM

## 2020-07-12 MED ORDER — OXYCODONE-ACETAMINOPHEN 7.5-325 MG PO TABS: 1.0000 | ORAL_TABLET | Freq: Two times a day (BID) | ORAL | 0 refills | Status: DC

## 2020-07-12 NOTE — Telephone Encounter (Signed)
o}  Notes to clinic:  medications requested were filled on 06/14/2020 Not due for refill just yet  Antibiotic is not assigned to a protocol     Requested Prescriptions  Pending Prescriptions Disp Refills   pantoprazole (PROTONIX) 40 MG tablet [Pharmacy Med Name: PANTOPRAZOLE SOD DR 40 MG TAB] 90 tablet 1    Sig: TAKE 1 TABLET BY MOUTH EVERY DAY      Gastroenterology: Proton Pump Inhibitors Passed - 07/12/2020  1:06 PM      Passed - Valid encounter within last 12 months    Recent Outpatient Visits           1 month ago Annual physical exam   [facility] [place], Anderson Malta M, PA-C   3 months ago Acute recurrent pansinusitis   [facility] [place], Clearnce Sorrel, [place]   3 months ago No-show for appointment   [facility], Anderson Malta M, [place]   7 months ago Acute non-recurrent pansinusitis   [facility] [place], [place], [place]   1 year ago Type I or II open fracture of right ankle with routine healing, subsequent encounter   [place], Anderson Malta M, PA-C                  hydrochlorothiazide (HYDRODIURIL) 12.5 MG tablet [Pharmacy Med Name: HYDROCHLOROTHIAZIDE 12.5 MG TB] 90 tablet 1    Sig: TAKE 1 TABLET BY MOUTH EVERY DAY      Cardiovascular: Diuretics - Thiazide Passed - 07/12/2020  1:06 PM      Passed - Ca in normal range and within 360 days    Calcium  Date Value Ref Range Status  06/01/2020 9.2 8.7 - 10.2 mg/dL Final          Passed - Cr in normal range and within 360 days    Creatinine, Ser  Date Value Ref Range Status  06/01/2020 0.75 0.57 - 1.00 mg/dL Final          Passed - K in normal range and within 360 days    Potassium  Date Value Ref Range Status  06/01/2020 4.0 3.5 - 5.2 mmol/L Final          Passed - Na in normal range and within 360 days    Sodium  Date Value Ref Range Status  06/01/2020 141 134 - 144 mmol/L Final          Passed - Last BP in normal  range    BP Readings from Last 1 Encounters:  06/01/20 115/76          Passed - Valid encounter within last 6 months    Recent Outpatient Visits           1 month ago Annual physical exam   [facility] Fenton [facility]alling M, [place]   3 months ago Acute recurrent pansinusitis   [facility] [place], Clearnce Sorrel, [place]   3 months ago No-show for appointment   [facility], [place], [place]   7 months ago Acute non-recurrent pansinusitis   [place], [place], [place]   1 year ago Type I or II open fracture of right ankle with routine healing, subsequent encounter   [facility] Fenton Malling M, PA-C                  amoxicillin-clavulanate (AUGMENTIN) 875-125 MG tablet [Pharmacy Med Name: AMOXICILLIN-CLAV 875-125MG  TAB] 30 tablet     Sig: TAKE  1 TABLET BY MOUTH TWICE A DAY      Off-Protocol Failed - 07/12/2020  1:06 PM      Failed - Medication not assigned to a protocol, review manually.      Passed - Valid encounter within last 12 months    Recent Outpatient Visits           1 month ago Annual physical exam   [place], [place]   3 months ago Acute recurrent pansinusitis   [place], [place]   3 months ago No-show for appointment   [place], [place]   7 months ago Acute non-recurrent pansinusitis   [facility] [place], [place], [place]   1 year ago Type I or II open fracture of right ankle with routine healing, subsequent encounter   [facility], Clearnce Sorrel, [place]

## 2020-07-12 NOTE — Telephone Encounter (Signed)
Copied from [place] [id number]. Topic: Quick Communication - Rx Refill/Question >> Jul 12, 2020  1:13 PM Leward Quan A wrote: Medication: amphetamine-dextroamphetamine (ADDERALL) 30 MG tablet   Has the patient contacted their pharmacy? Yes.   (Agent: If no, request that the patient contact the pharmacy for the refill.) (Agent: If yes, when and what did the pharmacy advise?)  Preferred Pharmacy (with phone number or street name): [id number] [place], [place]  Phone:  [phone] Fax:  [phone]     Agent: Please be advised that RX refills may take up to 3 business days. We ask that you follow-up with your pharmacy.

## 2020-07-12 NOTE — Telephone Encounter (Signed)
Requested medication (s) are due for refill today: no  Requested medication (s) are on the active medication list: yes  Last refill:  06/18/2020  Future visit scheduled: no  Notes to clinic:  medication should not be due for about another 6 days  Attempted to contact patient and left vm for her to callback and let us know if she is using more than prescribed    Requested Prescriptions  Pending Prescriptions Disp Refills   albuterol (VENTOLIN HFA) 108 (90 Base) MCG/ACT inhaler [Pharmacy Med Name: ALBUTEROL HFA (PROAIR) INHALER] 8.5 each 0    Sig: INHALE 2 PUFFS INTO THE LUNGS EVERY 4 HOURS AS NEEDED FOR WHEEZE OR FOR SHORTNESS OF BREATH      Pulmonology:  Beta Agonists Failed - 07/12/2020  1:06 PM      Failed - One inhaler should last at least one month. If the patient is requesting refills earlier, contact the patient to check for uncontrolled symptoms.      Passed - Valid encounter within last 12 months    Recent Outpatient Visits           1 month ago Annual physical exam   [place], [place]   3 months ago Acute recurrent pansinusitis   [place], [place]   3 months ago No-show for appointment   [place], [place]   7 months ago Acute non-recurrent pansinusitis   [facility] [place], [place], [place]   1 year ago Type I or II open fracture of right ankle with routine healing, subsequent encounter   [facility] [place], Clearnce Sorrel, [place]

## 2020-07-12 NOTE — Telephone Encounter (Signed)
Requested medication (s) are due for refill today: yes  Requested medication (s) are on the active medication list: yes  Last refill: 06/07/20  Future visit scheduled: no  Notes to clinic:  not delegated    Requested Prescriptions  Pending Prescriptions Disp Refills   amphetamine-dextroamphetamine (ADDERALL) 30 MG tablet 60 tablet 0    Sig: Take 1 tablet by mouth 2 (two) times daily.      Not Delegated - Psychiatry:  Stimulants/ADHD Failed - 07/12/2020  1:23 PM      Failed - This refill cannot be delegated      Failed - Urine Drug Screen completed in last 360 days      Passed - Valid encounter within last 3 months    Recent Outpatient Visits           1 month ago Annual physical exam   [facility] Fenton Malling M, [place]   3 months ago Acute recurrent pansinusitis   [facility] [place], Clearnce Sorrel, [place]   3 months ago No-show for appointment   [place], [place]   7 months ago Acute non-recurrent pansinusitis   [facility] [place], [place], [place]   1 year ago Type I or II open fracture of right ankle with routine healing, subsequent encounter   [facility], Clearnce Sorrel, [place]

## 2020-07-13 MED ORDER — AMPHETAMINE-DEXTROAMPHETAMINE 30 MG PO TABS: 1.0000 | ORAL_TABLET | Freq: Two times a day (BID) | ORAL | 0 refills | Status: DC

## 2020-08-10 ENCOUNTER — Other Ambulatory Visit: Payer: Self-pay | Admitting: Family Medicine

## 2020-08-10 DIAGNOSIS — M778 Other enthesopathies, not elsewhere classified: Secondary | ICD-10-CM

## 2020-08-10 DIAGNOSIS — F900 Attention-deficit hyperactivity disorder, predominantly inattentive type: Secondary | ICD-10-CM

## 2020-08-10 NOTE — Telephone Encounter (Signed)
Medication Refill - Medication: Adderall 30 mg And Oxycodone 7.5 Has the patient contacted their pharmacy? No. (Agent: If no, request that the patient contact the pharmacy for the refill.) (Agent: If yes, when and what did the pharmacy advise?)  Preferred Pharm[facility] (with phone number [facility] street [facility] [place]  Agent: Please be advised that RX refills may take up to 3 business days. We ask that you follow-up with your pharmacy.

## 2020-08-10 NOTE — Telephone Encounter (Signed)
Requested medication (s) are due for refill today: yes  Requested medication (s) are on the active medication list: yes  Last refill:  adderall- 07/13/20 #60 0 refills, endocet 07/12/20 #60 0 refills   Future visit scheduled: no  Notes to clinic:  not delegated per protocol     Requested Prescriptions  Pending Prescriptions Disp Refills   amphetamine-dextroamphetamine (ADDERALL) 30 MG tablet 60 tablet 0    Sig: Take 1 tablet by mouth 2 (two) times daily.      Not Delegated - Psychiatry:  Stimulants/ADHD Failed - 08/10/2020  2:46 PM      Failed - This refill cannot be delegated      Failed - Urine Drug Screen completed in last 360 days      Passed - Valid encounter within last 3 months    Recent Outpatient Visits           2 months ago Annual physical exam   [facility] Fenton [facility]alling M, [place]   4 months ago Acute recurrent pansinusitis   [facility] [place], Clearnce Sorrel, [place]   4 months ago No-show for appointment   [facility] Malta M, [place]   8 months ago Acute non-recurrent pansinusitis   [facility] [place], [place], [place]   1 year ago Type I or II open fracture of right ankle with routine healing, subsequent encounter   [facility] Fenton [facility]alling M, [place]                  oxyCODONE-acetaminophen (ENDOCET) 7.5-325 MG tablet 60 tablet 0    Sig: Take 1 tablet by mouth 2 (two) times daily.      Not Delegated - Analgesics:  Opioid Agonist Combinations Failed - 08/10/2020  2:46 PM      Failed - This refill cannot be delegated      Failed - Urine Drug Screen completed in last 360 days      Passed - Valid encounter within last 6 months    Recent Outpatient Visits           2 months ago Annual physical exam   [place], [place]   4 months ago Acute recurrent pansinusitis   [facility] [place], Clearnce Sorrel, [place]   4 months  ago No-show for appointment   [place], [place]   8 months ago Acute non-recurrent pansinusitis   [facility] [place], [place], [place]   1 year ago Type I or II open fracture of right ankle with routine healing, subsequent encounter   [facility], Clearnce Sorrel, [place]

## 2020-08-11 ENCOUNTER — Other Ambulatory Visit: Payer: Self-pay | Admitting: Physician Assistant

## 2020-08-11 DIAGNOSIS — F419 Anxiety disorder, unspecified: Secondary | ICD-10-CM

## 2020-08-11 MED ORDER — OXYCODONE-ACETAMINOPHEN 7.5-325 MG PO TABS: 1.0000 | ORAL_TABLET | Freq: Two times a day (BID) | ORAL | 0 refills | Status: DC

## 2020-08-11 MED ORDER — AMPHETAMINE-DEXTROAMPHETAMINE 30 MG PO TABS
1.0000 | ORAL_TABLET | Freq: Two times a day (BID) | ORAL | 0 refills | Status: DC
Start: 2020-08-11 — End: 2020-09-12

## 2020-08-18 NOTE — Telephone Encounter (Signed)
Requested medication (s) are due for refill today: yes   Requested medication (s) are on the active medication list: yes   Last refill: 07/13/2020  Future visit scheduled: yes  Notes to clinic: Pt called to report that she is completely out of her current supply and is requesting a refill please advise   Requested Prescriptions  Pending Prescriptions Disp Refills   ALPRAZolam (XANAX) 1 MG tablet [Pharmacy Med Name: ALPRAZOLAM 1 MG TABLET] 60 tablet     Sig: TAKE 1 TABLET BY MOUTH TWICE A DAY      Not Delegated - Psychiatry:  Anxiolytics/Hypnotics Failed - 08/18/2020 12:15 PM      Failed - This refill cannot be delegated      Failed - Urine Drug Screen completed in last 360 days      Passed - Valid encounter within last 6 months    Recent Outpatient Visits           2 months ago Annual physical exam   [place], [place]-C   5 months ago Acute recurrent pansinusitis   [facility] [place], Clearnce Sorrel, [place]   5 months ago No-show for appointment   [place], [place]   8 months ago Acute non-recurrent pansinusitis   [facility] [place], [place], [place]   1 year ago Type I or II open fracture of right ankle with routine healing, subsequent encounter   [facility], [place], [place]

## 2020-08-18 NOTE — Telephone Encounter (Signed)
Pt called to report that she is completely out of her current supply and is requesting a refill please advise

## 2020-09-01 ENCOUNTER — Other Ambulatory Visit: Payer: Self-pay | Admitting: Physician Assistant

## 2020-09-01 DIAGNOSIS — J0140 Acute pansinusitis, unspecified: Secondary | ICD-10-CM

## 2020-09-01 DIAGNOSIS — R319 Hematuria, unspecified: Secondary | ICD-10-CM | POA: Diagnosis not present

## 2020-09-01 DIAGNOSIS — A499 Bacterial infection, unspecified: Secondary | ICD-10-CM | POA: Diagnosis not present

## 2020-09-01 DIAGNOSIS — N390 Urinary tract infection, site not specified: Secondary | ICD-10-CM | POA: Diagnosis not present

## 2020-09-01 DIAGNOSIS — R300 Dysuria: Secondary | ICD-10-CM | POA: Diagnosis not present

## 2020-09-05 DIAGNOSIS — A499 Bacterial infection, unspecified: Secondary | ICD-10-CM | POA: Diagnosis not present

## 2020-09-05 DIAGNOSIS — N390 Urinary tract infection, site not specified: Secondary | ICD-10-CM | POA: Diagnosis not present

## 2020-09-05 DIAGNOSIS — T50905A Adverse effect of unspecified drugs, medicaments and biological substances, initial encounter: Secondary | ICD-10-CM | POA: Diagnosis not present

## 2020-09-12 ENCOUNTER — Other Ambulatory Visit: Payer: Self-pay | Admitting: Physician Assistant

## 2020-09-12 DIAGNOSIS — F900 Attention-deficit hyperactivity disorder, predominantly inattentive type: Secondary | ICD-10-CM

## 2020-09-12 DIAGNOSIS — M778 Other enthesopathies, not elsewhere classified: Secondary | ICD-10-CM

## 2020-09-12 NOTE — Telephone Encounter (Signed)
Copied from [place] [id number]. Topic: Quick Communication - Rx Refill/Question >> Sep 12, 2020 10:03 AM Tessa Lerner A wrote: Medication: oxyCODONE-acetaminophen (ENDOCET) 7.5-325 MG tablet   amphetamine-dextroamphetamine (ADDERALL) 30 MG tablet   Has the patient contacted their pharmacy? No. (Agent: If no, request that the patient contact the pharmacy for the refill.) (Agent: If yes, when and what did the pharmacy advise?)  Preferred Pharmacy (with phone number or street name): [id number] [place] [place]  Phone:  [phone] Fax:  [phone]  Agent: Please be advised that RX refills may take up to 3 business days. We ask that you follow-up with your pharmacy.

## 2020-09-12 NOTE — Telephone Encounter (Signed)
Requested medication (s) are due for refill today: yes  Requested medication (s) are on the active medication list: yes  Last refill:  08/10/2020  Future visit scheduled: no  Notes to clinic:  this refill cannot be delegated    Requested Prescriptions  Pending Prescriptions Disp Refills   oxyCODONE-acetaminophen (ENDOCET) 7.5-325 MG tablet 60 tablet 0    Sig: Take 1 tablet by mouth 2 (two) times daily.      Not Delegated - Analgesics:  Opioid Agonist Combinations Failed - 09/12/2020 10:07 AM      Failed - This refill cannot be delegated      Failed - Urine Drug Screen completed in last 360 days      Passed - Valid encounter within last 6 months    Recent Outpatient Visits           3 months ago Annual physical exam   [facility] F[facility], [place]   6 months ago Acute recurrent pansinusitis   [facility] [place], Clearnce Sor[place], [place]   6 months ago No-show for appointment   [facility] Fenton Malling M, [place]   9 months ago Acute non-recurrent pansinusitis   [facility] [place], [place], [place]   1 year ago Type I or II open fracture of right ankle with routine healing, subsequent encounter   [facility] Fenton Malling M, [place]                  amphetamine-dextroamphetamine (ADDERALL) 30 MG tablet 60 tablet 0    Sig: Take 1 tablet by mouth 2 (two) times daily.      Not Delegated - Psychiatry:  Stimulants/ADHD Failed - 09/12/2020 10:07 AM      Failed - This refill cannot be delegated      Failed - Urine Drug Screen completed in last 360 days      Failed - Valid encounter within last 3 months    Recent Outpatient Visits           3 months ago Annual physical exam   [place], [place]   6 months ago Acute recurrent pansinusitis   [facility] [place], Clearnce Sorrel, [place]   6 months ago No-show for appointment   [place], [place]   9 months ago Acute non-recurrent pansinusitis   [facility] [place] [place], [place], [place]   1 year ago Type I or II open fracture of right ankle with routine healing, subsequent encounter   [facility], Clearnce Sorrel, [place]

## 2020-09-12 NOTE — Telephone Encounter (Signed)
Please review for Dr. B ° ° °Thanks,  ° °-Satia Winger  °

## 2020-09-15 NOTE — Telephone Encounter (Signed)
Pt called asking about her refills and when they should be done  CB# [phone]

## 2020-09-16 MED ORDER — OXYCODONE-ACETAMINOPHEN 7.5-325 MG PO TABS: 1.0000 | ORAL_TABLET | Freq: Two times a day (BID) | ORAL | 0 refills | Status: DC

## 2020-09-16 MED ORDER — AMPHETAMINE-DEXTROAMPHETAMINE 30 MG PO TABS: 1.0000 | ORAL_TABLET | Freq: Two times a day (BID) | ORAL | 0 refills | Status: DC

## 2020-09-18 ENCOUNTER — Other Ambulatory Visit: Payer: Self-pay | Admitting: Physician Assistant

## 2020-09-18 DIAGNOSIS — F900 Attention-deficit hyperactivity disorder, predominantly inattentive type: Secondary | ICD-10-CM

## 2020-09-18 DIAGNOSIS — M778 Other enthesopathies, not elsewhere classified: Secondary | ICD-10-CM

## 2020-09-18 NOTE — Telephone Encounter (Signed)
Copied from [place] [phone]. Topic: Quick Communication - Rx Refill/Question >> Sep 18, 2020 12:05 PM Tessa Lerner A wrote: Medication: amphetamine-dextroamphetamine (ADDERALL) 30 MG tablet   oxyCODONE-acetaminophen (ENDOCET) 7.5-325 MG tablet   Has the patient contacted their pharmacy? Yes.   (Agent: If no, request that the patient contact the pharmacy for the refill.) (Agent: If yes, when and what did the pharmacy advise?)  Preferred Pharmacy (with phone number or street name): [id number] [place] [place]  Phone:  [phone] Fax:  [phone]   Agent: Please be advised that RX refills may take up to 3 business days. We ask that you follow-up with your pharmacy.

## 2020-09-19 ENCOUNTER — Other Ambulatory Visit: Payer: Self-pay | Admitting: Family Medicine

## 2020-09-19 DIAGNOSIS — F900 Attention-deficit hyperactivity disorder, predominantly inattentive type: Secondary | ICD-10-CM

## 2020-09-19 DIAGNOSIS — M778 Other enthesopathies, not elsewhere classified: Secondary | ICD-10-CM

## 2020-09-19 MED ORDER — OXYCODONE-ACETAMINOPHEN 7.5-325 MG PO TABS: 1.0000 | ORAL_TABLET | Freq: Two times a day (BID) | ORAL | 0 refills | Status: DC

## 2020-09-19 MED ORDER — AMPHETAMINE-DEXTROAMPHETAMINE 30 MG PO TABS: 1.0000 | ORAL_TABLET | Freq: Two times a day (BID) | ORAL | 0 refills | Status: DC

## 2020-10-12 ENCOUNTER — Other Ambulatory Visit: Payer: Self-pay | Admitting: Physician Assistant

## 2020-10-12 DIAGNOSIS — F419 Anxiety disorder, unspecified: Secondary | ICD-10-CM

## 2020-10-12 DIAGNOSIS — M778 Other enthesopathies, not elsewhere classified: Secondary | ICD-10-CM

## 2020-10-12 DIAGNOSIS — J4520 Mild intermittent asthma, uncomplicated: Secondary | ICD-10-CM

## 2020-10-12 NOTE — Telephone Encounter (Signed)
Requested medication (s) are due for refill today: Yes  Requested medication (s) are on the active medication list: Yes  Last refill:  1 month ago  Future visit scheduled: No  Notes to clinic:  Unable to refill per protocol, cannot delegate. Appointment needed with new provider.      Requested Prescriptions  Pending Prescriptions Disp Refills   ALPRAZolam (XANAX) 1 MG tablet 60 tablet 0    Sig: Take 1 tablet (1 mg total) by mouth 2 (two) times daily.      Not Delegated - Psychiatry:  Anxiolytics/Hypnotics Failed - 10/12/2020  4:12 PM      Failed - This refill cannot be delegated      Failed - Urine Drug Screen completed in last 360 days      Passed - Valid encounter within last 6 months    Recent Outpatient Visits           4 months ago Annual physical exam   [facility] Fenton [facility]alling M, [place]   7 months ago Acute recurrent pansinusitis   [facility] [place], Clearnce Sorrel, [place]   7 months ago No-show for appointment   [facility] Fenton Malling M, [place]   10 months ago Acute non-recurrent pansinusitis   [facility] [place], [place], [place]   1 year ago Type I or II open fracture of right ankle with routine healing, subsequent encounter   [facility] Fenton Malling M, [place]                  oxyCODONE-acetaminophen (ENDOCET) 7.5-325 MG tablet 60 tablet 0    Sig: Take 1 tablet by mouth 2 (two) times daily.      Not Delegated - Analgesics:  Opioid Agonist Combinations Failed - 10/12/2020  4:12 PM      Failed - This refill cannot be delegated      Failed - Urine Drug Screen completed in last 360 days      Passed - Valid encounter within last 6 months    Recent Outpatient Visits           4 months ago Annual physical exam   [facility] Fenton M[place], [place]   7 months ago Acute recurrent pansinusitis   [facility] [place], Clearnce Sorrel, [place]    7 months ago No-show for appointment   [facility] Fenton Malling M, [place]   10 months ago Acute non-recurrent pansinusitis   [facility] [place], [place], [place]   1 year ago Type I or II open fracture of right ankle with routine healing, subsequent encounter   [facility] Fenton Malling M, PA-C                  albuterol (VENTOLIN HFA) 108 (90 Base) MCG/ACT inhaler 18 g 2      Pulmonology:  Beta Agonists Failed - 10/12/2020  4:12 PM      Failed - One inhaler should last at least one month. If the patient is requesting refills earlier, contact the patient to check for uncontrolled symptoms.      Passed - Valid encounter within last 12 months    Recent Outpatient Visits           4 months ago Annual physical exam   [place], [place]   7 months ago Acute recurrent pansinusitis   [facility] [place], [place]   7 months  ago No-show for appointment   [place], [place]   10 months ago Acute non-recurrent pansinusitis   [place], [place]   1 year ago Type I or II open fracture of right ankle with routine healing, subsequent encounter   [facility] [place], Clearnce Sorrel, [place]

## 2020-10-12 NOTE — Telephone Encounter (Signed)
Medication Refill - Medication: ALPRAZolam (XANAX) 1 MG tablet , oxyCODONE-acetaminophen (ENDOCET) 7.5-325 and  albuterol (VENTOLIN HFA) 108 (90 Base) MCG/ACT inhaler   Has the patient contacted their pharmacy? yes (Agent: If no, request that the patient contact the pharmacy for the refill.) (Agent: If yes, when and what did the pharmacy advise?)contact pcp  Preferred Pharmacy (with phone number or street name):  [id number] [place]  [phone] Fax:  [phone]     Agent: Please be advised that RX refills may take up to 3 business days. We ask that you follow-up with your pharmacy.

## 2020-10-16 MED ORDER — ALPRAZOLAM 1 MG PO TABS: 1.0000 mg | ORAL_TABLET | Freq: Two times a day (BID) | ORAL | 0 refills | Status: DC

## 2020-10-16 MED ORDER — ALBUTEROL SULFATE HFA 108 (90 BASE) MCG/ACT IN AERS: INHALATION_SPRAY | RESPIRATORY_TRACT | 2 refills | Status: DC

## 2020-10-16 MED ORDER — OXYCODONE-ACETAMINOPHEN 7.5-325 MG PO TABS: 1.0000 | ORAL_TABLET | Freq: Two times a day (BID) | ORAL | 0 refills | Status: DC

## 2020-10-17 ENCOUNTER — Other Ambulatory Visit: Payer: Self-pay | Admitting: Family Medicine

## 2020-10-17 DIAGNOSIS — J4520 Mild intermittent asthma, uncomplicated: Secondary | ICD-10-CM

## 2020-10-17 NOTE — Telephone Encounter (Signed)
  Notes to clinic:  Alternative Requested:NOT COVERED   Requested Prescriptions  Pending Prescriptions Disp Refills   albuterol (VENTOLIN HFA) 108 (90 Base) MCG/ACT inhaler [Pharmacy Med Name: ALBUTEROL HFA (VENTOLIN) INH] 18 each 2    Sig: INHALE 2 PUFFS INTO THE LUNGS EVERY 4 HOURS AS NEEDED FOR WHEEZE OR FOR SHORTNESS OF BREATH      Pulmonology:  Beta Agonists Failed - 10/17/2020  8:28 AM      Failed - One inhaler should last at least one month. If the patient is requesting refills earlier, contact the patient to check for uncontrolled symptoms.      Passed - Valid encounter within last 12 months    Recent Outpatient Visits           4 months ago Annual physical exam   [place], [place]   7 months ago Acute recurrent pansinusitis   [facility] [place], Clearnce Sorrel, [place]   7 months ago No-show for appointment   [place], [place]   10 months ago Acute non-recurrent pansinusitis   [facility] [place], [place], [place]   1 year ago Type I or II open fracture of right ankle with routine healing, subsequent encounter   [facility], Clearnce [place], [place]

## 2020-10-18 ENCOUNTER — Other Ambulatory Visit: Payer: Self-pay | Admitting: Family Medicine

## 2020-10-18 DIAGNOSIS — F900 Attention-deficit hyperactivity disorder, predominantly inattentive type: Secondary | ICD-10-CM

## 2020-10-18 MED ORDER — AMPHETAMINE-DEXTROAMPHETAMINE 30 MG PO TABS: 1.0000 | ORAL_TABLET | Freq: Two times a day (BID) | ORAL | 0 refills | Status: DC

## 2020-10-18 NOTE — Telephone Encounter (Signed)
Requested medication (s) are due for refill today: yes  Requested medication (s) are on the active medication list:  yes  Last refill:  09/23/2020  Future visit scheduled:  yes  Notes to clinic:  this refill cannot be delegated   Requested Prescriptions  Pending Prescriptions Disp Refills   amphetamine-dextroamphetamine (ADDERALL) 30 MG tablet 60 tablet 0    Sig: Take 1 tablet by mouth 2 (two) times daily.      There is no refill protocol information for this order

## 2020-10-18 NOTE — Telephone Encounter (Signed)
Copied from [place] [id number]. Topic: Quick Communication - Rx Refill/Question >> Oct 18, 2020 10:43 AM Leward Quan A wrote: Medication: amphetamine-dextroamphetamine (ADDERALL) 30 MG tablet  Has the patient contacted their pharmacy? Yes.   (Agent: If no, request that the patient contact the pharmacy for the refill.) (Agent: If yes, when and what did the pharmacy advise?)  Preferred Pharmacy (with phone number or street name[id number] Phone:  [phone]:  [phone]    Agent: Please be advised that RX refills may take up to 3 business days. We ask that you follow-up with your pharmacy.

## 2020-11-15 ENCOUNTER — Other Ambulatory Visit: Payer: Self-pay | Admitting: Family Medicine

## 2020-11-15 ENCOUNTER — Other Ambulatory Visit: Payer: Self-pay | Admitting: Physician Assistant

## 2020-11-15 DIAGNOSIS — E034 Atrophy of thyroid (acquired): Secondary | ICD-10-CM

## 2020-11-15 DIAGNOSIS — J0140 Acute pansinusitis, unspecified: Secondary | ICD-10-CM

## 2020-11-15 DIAGNOSIS — M778 Other enthesopathies, not elsewhere classified: Secondary | ICD-10-CM

## 2020-11-15 DIAGNOSIS — F419 Anxiety disorder, unspecified: Secondary | ICD-10-CM

## 2020-11-15 DIAGNOSIS — F900 Attention-deficit hyperactivity disorder, predominantly inattentive type: Secondary | ICD-10-CM

## 2020-11-15 NOTE — Telephone Encounter (Unsigned)
Copied from [place] [phone]. Topic: Quick Communication - Rx Refill/Question >> Nov 15, 2020 11:13 AM Yvette Rack wrote: Medication: amphetamine-dextroamphetamine (ADDERALL) 30 MG tablet, oxyCODONE-acetaminophen (ENDOCET) 7.5-325 MG tablet, AND ALPRAZolam (XANAX) 1 MG tablet  Has the patient contacted their pharmacy? No. (Agent: If no, request that the patient contact the pharmacy for the refill.) (Agent: If yes, when and what did the pharmacy advise?)  Preferred Pharmacy (with phone number or street name): [id number] [phone] Fax: [phone] Agent: Please be advised that RX refills may take up to 3 business days. We ask that you follow-up with your pharmacy.

## 2020-11-15 NOTE — Telephone Encounter (Signed)
Requested medications are due for refill today.  yes  Requested medications are on the active medications list.  yes  Last refill. 10/16/2020 - Xanax, 10/18/2020 - Adderall, 10/16/2020 - Endocet  Future visit scheduled.   no  Notes to clinic.  Medications no delegated.

## 2020-11-16 NOTE — Telephone Encounter (Signed)
Needs to make f/u with any provider within the next month before refills can be sent x25m

## 2020-11-17 NOTE — Telephone Encounter (Signed)
Patient advised to schedule appt before any refills can be sent.

## 2020-11-20 ENCOUNTER — Telehealth: Payer: Self-pay

## 2020-11-20 NOTE — Telephone Encounter (Signed)
Copied from [place] [id number]. Topic: Appointment Scheduling - Scheduling Inquiry for Clinic >> Nov 17, 2020  5:40 PM Pawlus, Brayton Layman A wrote: Reason for CRM: Please advise if pt can be seen by Tally Joe on 11/23/20 after 3pm. >> Nov 17, 2020  5:42 PM Pawlus, Brayton Layman A wrote: Pt stated she just needs an office visit / to go over medications.

## 2020-11-21 MED ORDER — OXYCODONE-ACETAMINOPHEN 7.5-325 MG PO TABS: 1.0000 | ORAL_TABLET | Freq: Two times a day (BID) | ORAL | 0 refills | Status: DC

## 2020-11-21 MED ORDER — ALPRAZOLAM 1 MG PO TABS: 1.0000 mg | ORAL_TABLET | Freq: Two times a day (BID) | ORAL | 0 refills | Status: DC

## 2020-11-21 MED ORDER — AMPHETAMINE-DEXTROAMPHETAMINE 30 MG PO TABS: 1.0000 | ORAL_TABLET | Freq: Two times a day (BID) | ORAL | 0 refills | Status: DC

## 2020-11-21 NOTE — Telephone Encounter (Signed)
Courtesy refill; appt 9/15

## 2020-11-23 ENCOUNTER — Ambulatory Visit: Payer: Medicare Other | Admitting: Family Medicine

## 2020-11-23 ENCOUNTER — Other Ambulatory Visit: Payer: Self-pay

## 2020-11-23 ENCOUNTER — Ambulatory Visit (INDEPENDENT_AMBULATORY_CARE_PROVIDER_SITE_OTHER): Payer: Medicare Other | Admitting: Family Medicine

## 2020-11-23 ENCOUNTER — Encounter: Payer: Self-pay | Admitting: Family Medicine

## 2020-11-23 VITALS — BP 110/64 | HR 62 | Temp 98.9°F | Resp 16

## 2020-11-23 DIAGNOSIS — H60313 Diffuse otitis externa, bilateral: Secondary | ICD-10-CM | POA: Diagnosis not present

## 2020-11-23 DIAGNOSIS — F17200 Nicotine dependence, unspecified, uncomplicated: Secondary | ICD-10-CM

## 2020-11-23 DIAGNOSIS — Z716 Tobacco abuse counseling: Secondary | ICD-10-CM | POA: Insufficient documentation

## 2020-11-23 DIAGNOSIS — G43011 Migraine without aura, intractable, with status migrainosus: Secondary | ICD-10-CM | POA: Insufficient documentation

## 2020-11-23 DIAGNOSIS — E010 Iodine-deficiency related diffuse (endemic) goiter: Secondary | ICD-10-CM

## 2020-11-23 DIAGNOSIS — J324 Chronic pansinusitis: Secondary | ICD-10-CM | POA: Insufficient documentation

## 2020-11-23 MED ORDER — AMOXICILLIN-POT CLAVULANATE 875-125 MG PO TABS: 1.0000 | ORAL_TABLET | Freq: Two times a day (BID) | ORAL | 0 refills | Status: DC

## 2020-11-23 MED ORDER — SALINE SPRAY 0.65 % NA SOLN: 1.0000 | NASAL | 1 refills | Status: DC | PRN

## 2020-11-23 MED ORDER — PROPRANOLOL HCL 20 MG PO TABS: 20.0000 mg | ORAL_TABLET | Freq: Two times a day (BID) | ORAL | 0 refills | Status: DC

## 2020-11-23 NOTE — Assessment & Plan Note (Signed)
Known concern Complaints of fatigue/loss of appetite Denies difficulty focusing Denies weight gain/loss Denies hair loss Endorse change in appetite and activity level r/t frequent migraines

## 2020-11-23 NOTE — Assessment & Plan Note (Signed)
Augmentin provided Patient encouraged to revisit Flonase as primary prevention  Referral placed to ENT

## 2020-11-23 NOTE — Progress Notes (Signed)
Established patient visit   Patient: Kathleen Buchanan   DOB: [birth year]   55 y.o. Female  MRN: [id number] Visit Date: 11/23/2020  Today's healthcare provider: [name], FNP   Chief Complaint  Patient presents with   Follow-up   Sinusitis   Migraine   Subjective    HPI  Patient presents today c/o sinus issues X 1week. She also reports that she is having migraines that has worsened over the last few months. She is also requesting refills on her medications.      Medications: Outpatient Medications Prior to Visit  Medication Sig   acetaminophen (TYLENOL) 500 MG tablet Take 1,000 mg by mouth every 6 (six) hours as needed.    albuterol (VENTOLIN HFA) 108 (90 Base) MCG/ACT inhaler INHALE 2 PUFFS INTO THE LUNGS EVERY 4 HOURS AS NEEDED FOR WHEEZE OR FOR SHORTNESS OF BREATH   ALPRAZolam (XANAX) 1 MG tablet Take 1 tablet (1 mg total) by mouth 2 (two) times daily.   polyethylene glycol powder (GLYCOLAX/MIRALAX) powder Take 17 g by mouth 2 (two) times daily as needed.   amphetamine-dextroamphetamine (ADDERALL) 30 MG tablet Take 1 tablet by mouth 2 (two) times daily.   ascorbic acid (VITAMIN C) 500 MG tablet Take 1,000 mg by mouth daily.    Aspirin-Caffeine (BC FAST PAIN RELIEF PO) Take by mouth as needed.   Blood Glucose Monitoring Suppl (ONETOUCH VERIO) w/Device KIT To check blood sugar daily for diabetes   cetirizine (ZYRTEC) 10 MG tablet Take 10 mg by mouth daily.   glucose blood (ONETOUCH VERIO) test strip To check blood sugar daily for diabetes   hydrochlorothiazide (HYDRODIURIL) 12.5 MG tablet TAKE 1 TABLET BY MOUTH EVERY DAY   ibuprofen (ADVIL,MOTRIN) 200 MG tablet Take 4 tablets by mouth as needed.   Lancets (ONETOUCH ULTRASOFT) lancets To check blood sugar daily for diabetes   levothyroxine (SYNTHROID) 25 MCG tablet Take 1 tablet (25 mcg total) by mouth daily before breakfast.   Multiple Vitamin (MULTIVITAMIN) capsule Take 1 capsule by mouth daily.   Multiple  Vitamins-Minerals (HAIR SKIN AND NAILS FORMULA PO) Take 1 tablet by mouth daily.   oxyCODONE-acetaminophen (ENDOCET) 7.5-325 MG tablet Take 1 tablet by mouth 2 (two) times daily.   pantoprazole (PROTONIX) 40 MG tablet TAKE 1 TABLET BY MOUTH EVERY DAY   rosuvastatin (CRESTOR) 10 MG tablet Take 1 tablet (10 mg total) by mouth daily.   [DISCONTINUED] predniSONE (DELTASONE) 20 MG tablet Take 1 tablet (20 mg total) by mouth daily with breakfast. (Patient not taking: Reported on 11/23/2020)   No facility-administered medications prior to visit.    Review of Systems  Constitutional:  Positive for activity change and fatigue.  HENT:  Positive for congestion, postnasal drip, sinus pressure and sinus pain.   Respiratory:  Positive for cough. Negative for shortness of breath.   Cardiovascular:  Negative for chest pain, palpitations and leg swelling.  Musculoskeletal:  Positive for arthralgias and myalgias.  Neurological:  Positive for headaches.  Psychiatric/Behavioral:  Negative for self-injury, sleep disturbance and suicidal ideas. The patient is not nervous/anxious.       Objective    BP 110/64   Pulse 62   Temp 98.9 F (37.2 C)   Resp 16  {Show previous vital signs (optional):23777}  Physical Exam Vitals and nursing note reviewed.  Constitutional:      General: She is not in acute distress.    Appearance: Normal appearance. She is obese. She is not ill-appearing, toxic-appearing or  diaphoretic.  HENT:     Head: Normocephalic and atraumatic. Right periorbital erythema and left periorbital erythema present.     Jaw: Swelling present.     Salivary Glands: Right salivary gland is diffusely enlarged. Left salivary gland is diffusely enlarged.     Right Ear: Hearing normal. Swelling and tenderness present. Tympanic membrane is erythematous.     Left Ear: Hearing normal. Swelling and tenderness present. Tympanic membrane is erythematous.     Nose: Nasal tenderness, mucosal edema, congestion  and rhinorrhea present. Rhinorrhea is clear.     Right Turbinates: Enlarged and swollen.     Left Turbinates: Enlarged and swollen.     Right Sinus: Maxillary sinus tenderness and frontal sinus tenderness present.     Left Sinus: Maxillary sinus tenderness and frontal sinus tenderness present.     Mouth/Throat:     Lips: Pink.     Mouth: Mucous membranes are moist.  Neck:     Thyroid: Thyromegaly and thyroid tenderness present.     Trachea: Trachea and phonation normal.  Cardiovascular:     Rate and Rhythm: Normal rate and regular rhythm.     Pulses: Normal pulses.     Heart sounds: Normal heart sounds. No murmur heard.   No friction rub. No gallop.  Pulmonary:     Effort: Pulmonary effort is normal. No respiratory distress.     Breath sounds: Normal breath sounds. No stridor. No wheezing, rhonchi or rales.  Chest:     Chest wall: No tenderness.  Abdominal:     General: Bowel sounds are normal.     Palpations: Abdomen is soft.  Musculoskeletal:        General: No swelling, tenderness, deformity or signs of injury. Normal range of motion.     Cervical back: Pain with movement and muscular tenderness present.     Right lower leg: No edema.     Left lower leg: No edema.  Lymphadenopathy:     Cervical: Cervical adenopathy present.     Right cervical: Superficial cervical adenopathy present. No deep or posterior cervical adenopathy.    Left cervical: Superficial cervical adenopathy present. No deep or posterior cervical adenopathy.  Skin:    General: Skin is warm and dry.     Capillary Refill: Capillary refill takes less than 2 seconds.     Coloration: Skin is not jaundiced or pale.     Findings: No bruising, erythema, lesion or rash.  Neurological:     General: No focal deficit present.     Mental Status: She is alert and oriented to person, place, and time. Mental status is at baseline.     Cranial Nerves: No cranial nerve deficit.     Sensory: No sensory deficit.     Motor:  No weakness.     Coordination: Coordination normal.  Psychiatric:        Mood and Affect: Mood normal.        Behavior: Behavior normal.        Thought Content: Thought content normal.        Judgment: Judgment normal.     No results found for any visits on 11/23/20.  Assessment & Plan     Problem List Items Addressed This Visit       Cardiovascular and Mediastinum   Intractable migraine without aura and with status migrainosus - Primary    Trial of daily medication to prevent Has decreased BC use- previous 150/month Advised smoking cessation  Advised small, frequent meals  Relevant Medications   propranolol (INDERAL) 20 MG tablet     Respiratory   Chronic pansinusitis    Tender, swollen sinus cavities Does not wish to be on another round of prednisone Has not been to ENT Unable to tolerate nasal sprays d/t c/o Has Able to tolerate saline spray Advised increased hydration and upright positioning      Relevant Medications   sodium chloride (OCEAN) 0.65 % SOLN nasal spray   amoxicillin-clavulanate (AUGMENTIN) 875-125 MG tablet   Other Relevant Orders   Ambulatory referral to ENT     Endocrine   Big thyroid    Known concern Complaints of fatigue/loss of appetite Denies difficulty focusing Denies weight gain/loss Denies hair loss Endorse change in appetite and activity level r/t frequent migraines      Relevant Medications   propranolol (INDERAL) 20 MG tablet   Other Relevant Orders   Ambulatory referral to Endocrinology     Nervous and Auditory   Acute diffuse otitis externa of both ears    Augmentin provided Patient encouraged to revisit Flonase as primary prevention  Referral placed to ENT      Relevant Medications   amoxicillin-clavulanate (AUGMENTIN) 875-125 MG tablet     Other   Tobacco dependence    Continues to smoke 1/2 ppd; does not wish to quit      Encounter for smoking cessation counseling    Discussed options to assist with  quitting [place] quit line Medication Counseling Pt reported previously quitting but not feeling any different       Relevant Orders   AMB Referral to [facility]     Return in about 3 months (around 02/22/2021) for anxiety and depression, chonic disease management.     Vonna Kotyk, FNP, have reviewed all documentation for this visit. The documentation on 11/23/20 for the exam, diagnosis, procedures, and orders are all accurate and complete.    [name], [place] [phone] (phone) [phone] (fax)  [facility]

## 2020-11-23 NOTE — Assessment & Plan Note (Signed)
Tender, swollen sinus cavities Does not wish to be on another round of prednisone Has not been to ENT Unable to tolerate nasal sprays d/t c/o Has Able to tolerate saline spray Advised increased hydration and upright positioning

## 2020-11-23 NOTE — Assessment & Plan Note (Signed)
Discussed options to assist with quitting [place] quit line Medication Counseling Pt reported previously quitting but not feeling any different

## 2020-11-23 NOTE — Assessment & Plan Note (Signed)
Trial of daily medication to prevent Has decreased BC use- previous 150/month Advised smoking cessation  Advised small, frequent meals

## 2020-11-23 NOTE — Assessment & Plan Note (Signed)
Continues to smoke 1/2 ppd; does not wish to quit

## 2020-11-24 ENCOUNTER — Telehealth: Payer: Self-pay | Admitting: *Deleted

## 2020-11-24 NOTE — Chronic Care Management (AMB) (Signed)
  Chronic Care Management   Note  11/24/2020 Name: Kathleen Buchanan MRN: [id number] DOB: August 15, 1965  Kathleen Buchanan is a 55 y.o. year old female who is a primary care patient of [name], FNP. I reached out to [employer] by phone today in response to a referral sent by Kathleen Buchanan's PCP [name], FNP     Ms. Livas was given information about Chronic Care Management services today including:  CCM service includes personalized support from designated clinical staff supervised by her physician, including individualized plan of care and coordination with other care providers 24/7 contact phone numbers for assistance for urgent and routine care needs. Service will only be billed when office clinical staff spend 20 minutes or more in a month to coordinate care. Only one practitioner may furnish and bill the service in a calendar month. The patient may stop CCM services at any time (effective at the end of the month) by phone call to the office staff. The patient will be responsible for cost sharing (co-pay) of up to 20% of the service fee (after annual deductible is met).  Patient agreed to services and verbal consent obtained.   Follow up plan: Telephone appointment with care management team member scheduled for: 11/27/2020  Julian Hy, [place] [facility]  Direct Dial: [phone]

## 2020-11-27 ENCOUNTER — Ambulatory Visit (INDEPENDENT_AMBULATORY_CARE_PROVIDER_SITE_OTHER): Payer: Medicare Other

## 2020-11-27 DIAGNOSIS — E7800 Pure hypercholesterolemia, unspecified: Secondary | ICD-10-CM

## 2020-11-27 DIAGNOSIS — J449 Chronic obstructive pulmonary disease, unspecified: Secondary | ICD-10-CM

## 2020-11-27 DIAGNOSIS — R7303 Prediabetes: Secondary | ICD-10-CM

## 2020-11-27 NOTE — Patient Instructions (Addendum)
Thank you for allowing the Chronic Care Management team to participate in your care.    Patient Care Plan: Hypercholesterolemia (Adult)     Problem Identified: Hypercholesterolemia      Long-Range Goal: Hypercholesterolemia Monitored   Start Date: 11/27/2020  Expected End Date: 02/25/2021  Priority: Medium  Note:   Objective:  Lab Results  Component Value Date   CHOL 265 (H) 06/01/2020   HDL 38 (L) 06/01/2020   LDLCALC 191 (H) 06/01/2020   TRIG 190 (H) 06/01/2020   CHOLHDL 6.9 (H) 01/16/2018    Current Barriers:  Chronic Disease Management support and educational needs related to Hypercholesterolemia.  Case Manager Clinical Goal(s):  Over the next 90 days, patient will demonstrate improved adherence to prescribed treatment plan as evidenced by taking all medications as prescribed, completing labs as scheduled and adhering to a cardiac prudent/heart healthy diet.   Interventions:  Collaboration with [name], FNP regarding development and update of comprehensive plan of care as evidenced by provider attestation and co-signature Inter-disciplinary care team collaboration (see longitudinal plan of care) Reviewed medications. Advised to continue taking medications as advised. Advised to notify a provider if unable to tolerate the prescribed regimen.  Advised to monitor BP routinely. Discussed compliance with recommended cardiac prudent diet and activity level.  Encouraged to read nutrition labels, monitor sodium intake and avoid highly processed foods when possible. Advised to engage in low impact activities if able to tolerate. Reviewed s/sx of heart attack, stroke and worsening symptoms that require immediate medical attention.  Patient Goals/Self-Care Activities: Self-administer medications as prescribed Monitor and record blood pressure Adhere to recommended cardiac prudent/heart healthy diet Engage in low impact activity Notify provider or care management team with  questions and new concerns as needed   Follow Up Plan:  Will follow up next month.    Patient Care Plan: Chronic Airflow Limitation     Problem Identified: Symptom Exacerbation      Long-Range Goal: Symptom Exacerbation Prevented or Minimized   Start Date: 11/27/2020  Expected End Date: 02/25/2021  Priority: High  Note:   Current Barriers:  Chronic Disease Management support and education needs related to Chronic Airflow Limitation  Case Manager Clinical Goal(s): Over the next 120 days, patient will not require hospitalization or emergent care due to respiratory complications related to Chronic Airflow Limitation   Interventions:  Collaboration with [name], FNP regarding development and update of comprehensive plan of care as evidenced by provider attestation and co-signature Inter-disciplinary care team collaboration (see longitudinal plan of care) Provided verbal information regarding self care/management/and exacerbation prevention. Reports symptoms are usually triggered by seasonal weather changes. Denies current episodes of shortness of breath or changes in activity tolerance. Reports a recent sinus infection that eventually let to a double ear infection. She is pending outreach with ENT. Discussed current treatment plan and importance of daily self assessment. Advised to make an appointment if experiencing moderate symptoms for 48 hours without improvement.  Provided information regarding respiratory infection prevention. Advised to utilize  prevention strategies to reduce risk of respiratory infection  Discussed risk for complications related to daily cigarette use. Reports currently smoking a half pack of cigarettes/day. Discussed available resources for smoking cessation including [place] Quit. Declined need for resources.  Thoroughly reviewed worsening symptoms that require immediate medical attention.    Self-Care Activities/Patient Goals: Assess symptoms daily and  notify provider if experiencing moderate symptoms for 48 hrs without improvement Follow recommendations to prevent respiratory infection Consider plan for smoking cessation  Complete outreach with the ENT team as scheduled Notify provider or care management team with questions and new concerns as needed    Follow Up Plan:  Will follow up next month         Ms. Blakeman verbalized understanding of the information discussed during the telephonic outreach. Declined need for mailed/printed instructions. A member of the care management team will follow up next month.    [facility] Care Management [facility] [phone]

## 2020-11-27 NOTE — Chronic Care Management (AMB) (Signed)
Chronic Care Management   CCM RN Visit Note  11/27/2020 Name: Kathleen Buchanan MRN: [id number] DOB: 1965/10/06  Subjective: Kathleen Buchanan is a 55 y.o. year old female who is a primary care patient of [name], FNP. The care management team was consulted for assistance with disease management and care coordination needs.    Engaged with patient by telephone for initial visit in response to provider referral for case management and care coordination services.   Consent to Services:  The patient was given the following information about Chronic Care Management services: 1. CCM service includes personalized support from designated clinical staff supervised by the primary care provider, including individualized plan of care and coordination with other care providers 2. 24/7 contact phone numbers for assistance for urgent and routine care needs. 3. Service will only be billed when office clinical staff spend 20 minutes or more in a month to coordinate care. 4. Only one practitioner may furnish and bill the service in a calendar month. 5.The patient may stop CCM services at any time (effective at the end of the month) by phone call to the office staff. 6. The patient will be responsible for cost sharing (co-pay) of up to 20% of the service fee (after annual deductible is met). Patient agreed to services and consent obtained.  Assessment: Review of patient past medical history, allergies, medications, health status, including review of consultants reports, laboratory and other test data, was performed as part of comprehensive evaluation and provision of chronic care management services.   SDOH (Social Determinants of Health) assessments and interventions performed:  SDOH Interventions    Flowsheet Row Most Recent Value  SDOH Interventions   Food Insecurity Interventions Intervention Not Indicated  Transportation Interventions Intervention Not Indicated        CCM Care Plan  Allergies   Allergen Reactions   Codeine Hives   Erythromycin    Hydrocodone-Acetaminophen Hives   Lansoprazole    Levofloxacin    Migraine Formula  [Aspirin-Acetaminophen-Caffeine]    Morphine Sulfate    Omeprazole    Pepcid  [Famotidine]    Sulfa Antibiotics     Outpatient Encounter Medications as of 11/27/2020  Medication Sig Note   acetaminophen (TYLENOL) 500 MG tablet Take 1,000 mg by mouth every 6 (six) hours as needed.     albuterol (VENTOLIN HFA) 108 (90 Base) MCG/ACT inhaler INHALE 2 PUFFS INTO THE LUNGS EVERY 4 HOURS AS NEEDED FOR WHEEZE OR FOR SHORTNESS OF BREATH    ALPRAZolam (XANAX) 1 MG tablet Take 1 tablet (1 mg total) by mouth 2 (two) times daily.    amoxicillin-clavulanate (AUGMENTIN) 875-125 MG tablet Take 1 tablet by mouth 2 (two) times daily.    amphetamine-dextroamphetamine (ADDERALL) 30 MG tablet Take 1 tablet by mouth 2 (two) times daily.    ascorbic acid (VITAMIN C) 500 MG tablet Take 1,000 mg by mouth daily.  09/08/2014: Received from: [place]: Take by mouth.   Aspirin-Caffeine (BC FAST PAIN RELIEF PO) Take by mouth as needed.    cetirizine (ZYRTEC) 10 MG tablet Take 10 mg by mouth daily.    levothyroxine (SYNTHROID) 25 MCG tablet Take 1 tablet (25 mcg total) by mouth daily before breakfast.    oxyCODONE-acetaminophen (ENDOCET) 7.5-325 MG tablet Take 1 tablet by mouth 2 (two) times daily.    pantoprazole (PROTONIX) 40 MG tablet TAKE 1 TABLET BY MOUTH EVERY DAY    Blood Glucose Monitoring Suppl (ONETOUCH VERIO) w/Device KIT To check blood sugar  daily for diabetes    glucose blood (ONETOUCH VERIO) test strip To check blood sugar daily for diabetes    hydrochlorothiazide (HYDRODIURIL) 12.5 MG tablet TAKE 1 TABLET BY MOUTH EVERY DAY (Patient not taking: Reported on 11/27/2020) 11/27/2020: Reports being instructed to hold temporarily d/t medication changes.   ibuprofen (ADVIL,MOTRIN) 200 MG tablet Take 4 tablets by mouth as needed. 09/08/2014:  Medication taken as needed.  Received from: [place]: Take by mouth.   Lancets (ONETOUCH ULTRASOFT) lancets To check blood sugar daily for diabetes    Multiple Vitamin (MULTIVITAMIN) capsule Take 1 capsule by mouth daily. (Patient not taking: Reported on 11/27/2020)    Multiple Vitamins-Minerals (HAIR SKIN AND NAILS FORMULA PO) Take 1 tablet by mouth daily. (Patient not taking: Reported on 11/27/2020)    polyethylene glycol powder (GLYCOLAX/MIRALAX) powder Take 17 g by mouth 2 (two) times daily as needed. (Patient not taking: Reported on 11/27/2020)    propranolol (INDERAL) 20 MG tablet Take 1 tablet (20 mg total) by mouth 2 (two) times daily.    rosuvastatin (CRESTOR) 10 MG tablet Take 1 tablet (10 mg total) by mouth daily.    sodium chloride (OCEAN) 0.65 % SOLN nasal spray Place 1 spray into both nostrils as needed for congestion.    No facility-administered encounter medications on file as of 11/27/2020.    Patient Active Problem List   Diagnosis Date Noted   Intractable migraine without aura and with status migrainosus 11/23/2020   Acute diffuse otitis externa of both ears 11/23/2020   Chronic pansinusitis 11/23/2020   Encounter for smoking cessation counseling 11/23/2020   Depression, major, single episode, severe ([place]) 06/01/2020   Menopausal syndrome (hot flashes) 08/13/2016   Tobacco dependence 08/13/2016   Family history of diabetes mellitus in mother 07/10/2016   Chronic low back pain 11/08/2014   Onychomycosis 09/15/2014   ADD (attention deficit disorder) 09/08/2014   Ankle pain 09/08/2014   Ache in joint 09/08/2014   Anxiety 09/08/2014   Airway hyperreactivity 09/08/2014   Ankle fracture 09/08/2014   Back ache 09/08/2014   Bursitis of hip 09/08/2014   Chronic anemia 09/08/2014   Chronic constipation 09/08/2014   CAFL (chronic airflow limitation) (HCC) 09/08/2014   Accumulation of fluid in tissues 09/08/2014   Biliary sludge 09/08/2014    Acid reflux 09/08/2014   History of migraine headaches 09/08/2014   Hypercholesteremia 09/08/2014   Adiposity 09/08/2014   Episodic paroxysmal anxiety disorder 09/08/2014   Scoliosis 09/08/2014   Tendinitis of wrist 09/08/2014   Big thyroid 09/08/2014   Current tobacco use 09/08/2014    Conditions to be addressed/monitored:HLD, Pulmonary Disease(Chronic Airflow Limitation) Patient Care Plan: Hypercholesterolemia (Adult)     Problem Identified: Hypercholesterolemia      Long-Range Goal: Hypercholesterolemia Monitored   Start Date: 11/27/2020  Expected End Date: 02/25/2021  Priority: Medium  Note:   Objective:  Lab Results  Component Value Date   CHOL 265 (H) 06/01/2020   HDL 38 (L) 06/01/2020   LDLCALC 191 (H) 06/01/2020   TRIG 190 (H) 06/01/2020   CHOLHDL 6.9 (H) 01/16/2018    Current Barriers:  Chronic Disease Management support and educational needs related to Hypercholesterolemia.  Case Manager Clinical Goal(s):  Over the next 90 days, patient will demonstrate improved adherence to prescribed treatment plan as evidenced by taking all medications as prescribed, completing labs as scheduled and adhering to a cardiac prudent/heart healthy diet.   Interventions:  Collaboration with [name], FNP regarding development and update of  comprehensive plan of care as evidenced by provider attestation and co-signature Inter-disciplinary care team collaboration (see longitudinal plan of care) Reviewed medications. Advised to continue taking medications as advised. Advised to notify a provider if unable to tolerate the prescribed regimen.  Advised to monitor BP routinely. Discussed compliance with recommended cardiac prudent diet and activity level.  Encouraged to read nutrition labels, monitor sodium intake and avoid highly processed foods when possible. Advised to engage in low impact activities if able to tolerate. Reviewed s/sx of heart attack, stroke and worsening symptoms  that require immediate medical attention.  Patient Goals/Self-Care Activities: Self-administer medications as prescribed Monitor and record blood pressure Adhere to recommended cardiac prudent/heart healthy diet Engage in low impact activity Notify provider or care management team with questions and new concerns as needed   Follow Up Plan:  Will follow up next month.    Patient Care Plan: Chronic Airflow Limitation     Problem Identified: Symptom Exacerbation      Long-Range Goal: Symptom Exacerbation Prevented or Minimized   Start Date: 11/27/2020  Expected End Date: 02/25/2021  Priority: High  Note:   Current Barriers:  Chronic Disease Management support and education needs related to Chronic Airflow Limitation  Case Manager Clinical Goal(s): Over the next 120 days, patient will not require hospitalization or emergent care due to respiratory complications related to Chronic Airflow Limitation   Interventions:  Collaboration with [name], FNP regarding development and update of comprehensive plan of care as evidenced by provider attestation and co-signature Inter-disciplinary care team collaboration (see longitudinal plan of care) Provided verbal information regarding self care/management/and exacerbation prevention. Reports symptoms are usually triggered by seasonal weather changes. Denies current episodes of shortness of breath or changes in activity tolerance. Reports a recent sinus infection that eventually let to a double ear infection. She is pending outreach with ENT. Discussed current treatment plan and importance of daily self assessment. Advised to make an appointment if experiencing moderate symptoms for 48 hours without improvement.  Provided information regarding respiratory infection prevention. Advised to utilize  prevention strategies to reduce risk of respiratory infection  Discussed risk for complications related to daily cigarette use. Reports currently  smoking a half pack of cigarettes/day. Discussed available resources for smoking cessation including [place] [facility]. Declined need for resources.  Thoroughly reviewed worsening symptoms that require immediate medical attention.    Self-Care Activities/Patient Goals: Assess symptoms daily and notify provider if experiencing moderate symptoms for 48 hrs without improvement Follow recommendations to prevent respiratory infection Consider plan for smoking cessation Complete outreach with the ENT team as scheduled Notify provider or care management team with questions and new concerns as needed    Follow Up Plan:  Will follow up next month         PLAN A member of the care management team will follow up next month.    Cristy Friedlander [facility]/THN Care Management [facility] [phone]

## 2020-12-08 DIAGNOSIS — J449 Chronic obstructive pulmonary disease, unspecified: Secondary | ICD-10-CM | POA: Diagnosis not present

## 2020-12-08 DIAGNOSIS — E7800 Pure hypercholesterolemia, unspecified: Secondary | ICD-10-CM

## 2020-12-15 ENCOUNTER — Ambulatory Visit (INDEPENDENT_AMBULATORY_CARE_PROVIDER_SITE_OTHER): Payer: Medicare Other

## 2020-12-15 ENCOUNTER — Other Ambulatory Visit: Payer: Self-pay | Admitting: Family Medicine

## 2020-12-15 ENCOUNTER — Telehealth: Payer: Self-pay | Admitting: *Deleted

## 2020-12-15 DIAGNOSIS — E7800 Pure hypercholesterolemia, unspecified: Secondary | ICD-10-CM | POA: Insufficient documentation

## 2020-12-15 DIAGNOSIS — H0260 Xanthelasma of unspecified eye, unspecified eyelid: Secondary | ICD-10-CM | POA: Insufficient documentation

## 2020-12-15 NOTE — Telephone Encounter (Signed)
   Telephone encounter was:  Unsuccessful.  12/15/2020 Name: Kathleen Buchanan MRN: [id number] DOB: [birth year]  Unsuccessful outbound call made today to assist with:  Transportation Needs   Outreach Attempt:  1st Attempt  A HIPAA compliant voice message was left requesting a return call.  Instructed patient to call back at   Instructed patient to call back at [phone]  at their earliest convenience.   [place], Care Management  [phone] [address] , [place] [address] Email : [email or web address]  .

## 2020-12-15 NOTE — Chronic Care Management (AMB) (Signed)
Chronic Care Management   CCM RN Visit Note  12/15/2020 Name: Kathleen Buchanan MRN: [id number] DOB: [birth year]  Subjective: Kathleen Buchanan is a 55 y.o. year old female who is a primary care patient of [name], FNP. The care management team was consulted for assistance with disease management and care coordination needs.    Follow up care coordination was conducted today.   Consent to Services:  The patient was given information about Chronic Care Management services, agreed to services, and gave verbal consent prior to initiation of services.  Please see initial visit note for detailed documentation.   Patient agreed to services and verbal consent obtained.   Assessment: Review of patient past medical history, allergies, medications, health status, including review of consultants reports, laboratory and other test data, was performed as part of comprehensive evaluation and provision of chronic care management services.   SDOH (Social Determinants of Health) assessments and interventions performed:  No  CCM Care Plan  Allergies  Allergen Reactions   Codeine Hives   Erythromycin    Hydrocodone-Acetaminophen Hives   Lansoprazole    Levofloxacin    Migraine Formula  [Aspirin-Acetaminophen-Caffeine]    Morphine Sulfate    Omeprazole    Pepcid  [Famotidine]    Sulfa Antibiotics     Outpatient Encounter Medications as of 12/15/2020  Medication Sig Note   acetaminophen (TYLENOL) 500 MG tablet Take 1,000 mg by mouth every 6 (six) hours as needed.     albuterol (VENTOLIN HFA) 108 (90 Base) MCG/ACT inhaler INHALE 2 PUFFS INTO THE LUNGS EVERY 4 HOURS AS NEEDED FOR WHEEZE OR FOR SHORTNESS OF BREATH    ALPRAZolam (XANAX) 1 MG tablet Take 1 tablet (1 mg total) by mouth 2 (two) times daily.    amoxicillin-clavulanate (AUGMENTIN) 875-125 MG tablet Take 1 tablet by mouth 2 (two) times daily.    amphetamine-dextroamphetamine (ADDERALL) 30 MG tablet Take 1 tablet by mouth 2 (two) times  daily.    ascorbic acid (VITAMIN C) 500 MG tablet Take 1,000 mg by mouth daily.  09/08/2014: Received from: [place]: Take by mouth.   Aspirin-Caffeine (BC FAST PAIN RELIEF PO) Take by mouth as needed.    Blood Glucose Monitoring Suppl (ONETOUCH VERIO) w/Device KIT To check blood sugar daily for diabetes    cetirizine (ZYRTEC) 10 MG tablet Take 10 mg by mouth daily.    glucose blood (ONETOUCH VERIO) test strip To check blood sugar daily for diabetes    hydrochlorothiazide (HYDRODIURIL) 12.5 MG tablet TAKE 1 TABLET BY MOUTH EVERY DAY (Patient not taking: Reported on 11/27/2020) 11/27/2020: Reports being instructed to hold temporarily d/t medication changes.   ibuprofen (ADVIL,MOTRIN) 200 MG tablet Take 4 tablets by mouth as needed. 09/08/2014: Medication taken as needed.  Received from: [facility]: Take by mouth.   Lancets (ONETOUCH ULTRASOFT) lancets To check blood sugar daily for diabetes    levothyroxine (SYNTHROID) 25 MCG tablet TAKE 1 TABLET BY MOUTH DAILY BEFORE BREAKFAST.    Multiple Vitamin (MULTIVITAMIN) capsule Take 1 capsule by mouth daily. (Patient not taking: Reported on 11/27/2020)    Multiple Vitamins-Minerals (HAIR SKIN AND NAILS FORMULA PO) Take 1 tablet by mouth daily. (Patient not taking: Reported on 11/27/2020)    oxyCODONE-acetaminophen (ENDOCET) 7.5-325 MG tablet Take 1 tablet by mouth 2 (two) times daily.    pantoprazole (PROTONIX) 40 MG tablet TAKE 1 TABLET BY MOUTH EVERY DAY    polyethylene glycol powder (GLYCOLAX/MIRALAX) powder Take 17 g  by mouth 2 (two) times daily as needed. (Patient not taking: Reported on 11/27/2020)    propranolol (INDERAL) 20 MG tablet Take 1 tablet (20 mg total) by mouth 2 (two) times daily.    rosuvastatin (CRESTOR) 10 MG tablet Take 1 tablet (10 mg total) by mouth daily.    sodium chloride (OCEAN) 0.65 % SOLN nasal spray Place 1 spray into both nostrils as needed for congestion.    No  facility-administered encounter medications on file as of 12/15/2020.    Patient Active Problem List   Diagnosis Date Noted   Intractable migraine without aura and with status migrainosus 11/23/2020   Acute diffuse otitis externa of both ears 11/23/2020   Chronic pansinusitis 11/23/2020   Encounter for smoking cessation counseling 11/23/2020   Depression, major, single episode, severe ([place]) 06/01/2020   Menopausal syndrome (hot flashes) 08/13/2016   Tobacco dependence 08/13/2016   Family history of diabetes mellitus in mother 07/10/2016   Chronic low back pain 11/08/2014   Onychomycosis 09/15/2014   ADD (attention deficit disorder) 09/08/2014   Ankle pain 09/08/2014   Ache in joint 09/08/2014   Anxiety 09/08/2014   Airway hyperreactivity 09/08/2014   Ankle fracture 09/08/2014   Back ache 09/08/2014   Bursitis of hip 09/08/2014   Chronic anemia 09/08/2014   Chronic constipation 09/08/2014   CAFL (chronic airflow limitation) (HCC) 09/08/2014   Accumulation of fluid in tissues 09/08/2014   Biliary sludge 09/08/2014   Acid reflux 09/08/2014   History of migraine headaches 09/08/2014   Hypercholesteremia 09/08/2014   Adiposity 09/08/2014   Episodic paroxysmal anxiety disorder 09/08/2014   Scoliosis 09/08/2014   Tendinitis of wrist 09/08/2014   Big thyroid 09/08/2014   Current tobacco use 09/08/2014    Conditions to be addressed/monitored:HLD  Patient Care Plan: Hypercholesterolemia (Adult)     Problem Identified: Hypercholesterolemia      Long-Range Goal: Hypercholesterolemia Monitored   Start Date: 11/27/2020  Expected End Date: 02/25/2021  Priority: Medium  Note:   Objective:  Lab Results  Component Value Date   CHOL 265 (H) 06/01/2020   HDL 38 (L) 06/01/2020   LDLCALC 191 (H) 06/01/2020   TRIG 190 (H) 06/01/2020   CHOLHDL 6.9 (H) 01/16/2018    Current Barriers:  Chronic Disease Management support and educational needs related to Hypercholesterolemia.  Case  Manager Clinical Goal(s):  Over the next 90 days, patient will demonstrate improved adherence to prescribed treatment plan as evidenced by taking all medications as prescribed, completing labs as scheduled and adhering to a cardiac prudent/heart healthy diet.   Interventions:  Collaboration with [name], FNP regarding development and update of comprehensive plan of care as evidenced by provider attestation and co-signature Inter-disciplinary care team collaboration (see longitudinal plan of care) Reviewed medications. Advised to continue taking medications as advised. Advised to notify a provider if unable to tolerate the prescribed regimen.  Advised to monitor BP routinely. Discussed compliance with recommended cardiac prudent diet and activity level.  Encouraged to read nutrition labels, monitor sodium intake and avoid highly processed foods when possible. Advised to engage in low impact activities if able to tolerate. Reviewed s/sx of heart attack, stroke and worsening symptoms that require immediate medical attention. Update 12/15/20: Message forwarded to PCP regarding patient's request for referral d/t xanthelasmata. Patient declines change in vision or ability to move eyelids. Reports cholesterol deposits are becoming more noticeable. She would like to discuss treatment options with a Dermatologist.  Patient Goals/Self-Care Activities: Self-administer medications as prescribed Monitor and record  blood pressure Adhere to recommended cardiac prudent/heart healthy diet Engage in low impact activity Notify provider or care management team with questions and new concerns as needed      PLAN A member of the care management team will follow up later this month.   Cristy [facility]lander [facility] Care Management [facility] [phone]

## 2020-12-18 ENCOUNTER — Telehealth: Payer: Self-pay | Admitting: Family Medicine

## 2020-12-18 NOTE — Patient Instructions (Signed)
Thank you for allowing the Chronic Care Management team to participate in your care.   Patient Care Plan: Hypercholesterolemia (Adult)     Problem Identified: Hypercholesterolemia      Long-Range Goal: Hypercholesterolemia Monitored   Start Date: 11/27/2020  Expected End Date: 02/25/2021  Priority: Medium  Note:   Objective:  Lab Results  Component Value Date   CHOL 265 (H) 06/01/2020   HDL 38 (L) 06/01/2020   LDLCALC 191 (H) 06/01/2020   TRIG 190 (H) 06/01/2020   CHOLHDL 6.9 (H) 01/16/2018    Current Barriers:  Chronic Disease Management support and educational needs related to Hypercholesterolemia.  Case Manager Clinical Goal(s):  Over the next 90 days, patient will demonstrate improved adherence to prescribed treatment plan as evidenced by taking all medications as prescribed, completing labs as scheduled and adhering to a cardiac prudent/heart healthy diet.   Interventions:  Collaboration with [name], FNP regarding development and update of comprehensive plan of care as evidenced by provider attestation and co-signature Inter-disciplinary care team collaboration (see longitudinal plan of care) Reviewed medications. Advised to continue taking medications as advised. Advised to notify a provider if unable to tolerate the prescribed regimen.  Advised to monitor BP routinely. Discussed compliance with recommended cardiac prudent diet and activity level.  Encouraged to read nutrition labels, monitor sodium intake and avoid highly processed foods when possible. Advised to engage in low impact activities if able to tolerate. Reviewed s/sx of heart attack, stroke and worsening symptoms that require immediate medical attention. Update 12/15/20: Message forwarded to PCP regarding patient's request for referral d/t xanthelasmata. Patient declines change in vision or ability to move eyelids. Reports cholesterol deposits are becoming more noticeable. She would like to discuss  treatment options with a Dermatologist.  Patient Goals/Self-Care Activities: Self-administer medications as prescribed Monitor and record blood pressure Adhere to recommended cardiac prudent/heart healthy diet Engage in low impact activity Notify provider or care management team with questions and new concerns as needed      Care Coordination was conducted r/t patient's request for a Dermatology referral.  A member of the care management team will follow up later this month.   Cristy Friedlander [facility]N Care Management [facility] [phone]

## 2020-12-18 NOTE — Telephone Encounter (Signed)
Medication Refill - Medication:  oxyCODONE-acetaminophen (ENDOCET) 7.5-325 MG tablet 60 tablet 0 11/21/2020    Sig - Route: Take 1 tablet by mouth 2 (two) times daily. - Oral   Sent to pharmacy as: oxyCODONE-acetaminophen (ENDOCET) 7.5-325 MG tablet   Earliest Fill Date: 11/21/2020   Notes to Pharmacy: Do not fill <30 days from last refill   E-Prescribing Status: Receipt confirmed by pharmacy (11/21/2020  4:49 PM EDT)    oxyCODONE-acetaminophen (ENDOCET) 7.5-325 MG tablet 60 tablet 0 11/21/2020    Sig - Route: Take 1 tablet by mouth 2 (two) times daily. - Oral   Sent to pharmacy as: oxyCODONE-acetaminophen (ENDOCET) 7.5-325 MG tablet   Earliest Fill Date: 11/21/2020   Notes to Pharmacy: Do not fill <30 days from last refill   E-Prescribing Status: Receipt confirmed by pharmacy (11/21/2020  4:49 PM EDT    ALPRAZolam (XANAX) 1 MG tablet 60 tablet 0 11/21/2020    Sig - Route: Take 1 tablet (1 mg total) by mouth 2 (two) times daily. - Oral   Sent to pharmacy as: ALPRAZolam Duanne Moron) 1 MG tablet   Notes to Pharmacy: Do not refill < 30 days from last refill. This request is for a new prescription for a controlled substance as required by Federal/State law. DX Code .   E-Prescribing Status: Receipt confirmed by pharmacy (11/21/2020  4:49 PM EDT     Has the patient contacted their pharmacy? No. (Agent: If no, request that the patient contact the pharmacy for the refill.) States pharmacy will not as controlled (Agent: If yes, when and what did the pharmacy advise?)Call dr  Preferred Pharmacy (with phone number or street name):  [id number] - [address], [address]  [place] [address]  Phone: [phone] Fax: [phone]   Has the patient been seen for an appointment in the last year OR does the patient have an upcoming appointment? Yes.   9/22  Agent: Please be advised that RX refills may take up to 3 business days. We ask that you  follow-up with your pharmacy.

## 2020-12-19 ENCOUNTER — Other Ambulatory Visit: Payer: Self-pay | Admitting: Family Medicine

## 2020-12-19 DIAGNOSIS — M778 Other enthesopathies, not elsewhere classified: Secondary | ICD-10-CM

## 2020-12-19 DIAGNOSIS — F419 Anxiety disorder, unspecified: Secondary | ICD-10-CM

## 2020-12-19 MED ORDER — ALPRAZOLAM 1 MG PO TABS: 1.0000 mg | ORAL_TABLET | Freq: Two times a day (BID) | ORAL | 0 refills | Status: DC

## 2020-12-19 MED ORDER — OXYCODONE-ACETAMINOPHEN 7.5-325 MG PO TABS: 1.0000 | ORAL_TABLET | Freq: Two times a day (BID) | ORAL | 0 refills | Status: DC

## 2020-12-19 NOTE — Telephone Encounter (Signed)
Please review request last office visit 11/23/20 last filled 11/21/20. KW

## 2020-12-21 ENCOUNTER — Telehealth: Payer: Self-pay | Admitting: *Deleted

## 2020-12-21 NOTE — Telephone Encounter (Signed)
   Telephone encounter was:  Unsuccessful.  12/21/2020 Name: Kathleen Buchanan MRN: [id number] DOB: 1965-10-15  Unsuccessful outbound call made today to assist with:  Transportation Needs   Outreach Attempt:  2nd Attempt  A HIPAA compliant voice message was left requesting a return call.  Instructed patient to call back at   Instructed patient to call back at [phone]  at their earliest convenience. .  [place], Care Management  [phone] [address] , [place] [address] Email : [email or web address]

## 2020-12-22 ENCOUNTER — Other Ambulatory Visit: Payer: Self-pay | Admitting: Family Medicine

## 2020-12-22 DIAGNOSIS — F900 Attention-deficit hyperactivity disorder, predominantly inattentive type: Secondary | ICD-10-CM

## 2020-12-22 NOTE — Telephone Encounter (Signed)
Patient is out of this med, please send short supply. Medication Refill - Medication:(ADDERALL) 30 MG tablet  Has the patient contacted their pharmacy? yes (Agent: If no, request that the patient contact the pharmacy for the refill.) (Agent: If yes, when and what did the pharmacy advise?)contact pcp  Preferred Pharmacy (with phone number or street name): [id number] [place], [place]  Phone:  [phone] Fax:  [phone] Has the patient been seen for an appointment in the last year OR does the patient have an upcoming appointment? yes  Agent: Please be advised that RX refills may take up to 3 business days. We ask that you follow-up with your pharmacy.

## 2020-12-23 NOTE — Telephone Encounter (Signed)
Requested medications are due for refill today yes  Requested medications are on the active medication list yes  Last visit 11/23/20  Future visit scheduled 06/01/20  Notes to clinic Not Delegated, please assess.  Requested Prescriptions  Pending Prescriptions Disp Refills   amphetamine-dextroamphetamine (ADDERALL) 30 MG tablet 60 tablet 0    Sig: Take 1 tablet by mouth 2 (two) times daily.     Not Delegated - Psychiatry:  Stimulants/ADHD Failed - 12/22/2020  7:59 PM      Failed - This refill cannot be delegated      Failed - Urine Drug Screen completed in last 360 days      Passed - Valid encounter within last 3 months    Recent Outpatient Visits           1 month ago Intractable migraine without aura and with status migrainosus   [facility] [name], FNP   6 months ago Annual physical exam   [place], [place]   9 months ago Acute recurrent pansinusitis   [facility], [place], [place]   9 months ago No-show for appointment   [facility], [place], [place]   1 year ago Acute non-recurrent pansinusitis   [facility] [place], [place], [place]       Future Appointments             In 5 months Ralene Bathe, MD [place]   In 5 months [name], [place], [place]

## 2020-12-25 ENCOUNTER — Telehealth: Payer: Medicare Other

## 2020-12-25 ENCOUNTER — Telehealth: Payer: Self-pay

## 2020-12-25 ENCOUNTER — Ambulatory Visit: Payer: Self-pay

## 2020-12-25 DIAGNOSIS — E7800 Pure hypercholesterolemia, unspecified: Secondary | ICD-10-CM

## 2020-12-25 NOTE — Telephone Encounter (Signed)
  Care Management   Follow Up Note   12/25/2020 Name: Kathleen Buchanan MRN: [id number] DOB: [birth year]   Primary Care Provider: [name], FNP Reason for referral : Chronic Care Management   An unsuccessful telephone outreach was attempted today. The patient was referred to the case management team for assistance with care management and care coordination.    Follow Up Plan:  A HIPAA compliant voice message was left today requesting a return call.   [facility] Care Management [facility] [phone]

## 2020-12-25 NOTE — Chronic Care Management (AMB) (Signed)
Error Please Disregard

## 2020-12-26 NOTE — Telephone Encounter (Signed)
Pt called to report that she is completely out of her current supply. She says this is two months in a row that her Rx has not been called in. Please advise  [id number] [place]  [address] [place] [address]  Phone: [phone] Fax: [phone]

## 2020-12-26 NOTE — Telephone Encounter (Signed)
Requested medication (s) are due for refill today:   Provider to review  Requested medication (s) are on the active medication list:   Yes  Future visit scheduled:   Yes   Last ordered: 11/21/2020 #60, 0 refills  Non delegated refill   Requested Prescriptions  Pending Prescriptions Disp Refills   amphetamine-dextroamphetamine (ADDERALL) 30 MG tablet 60 tablet 0    Sig: Take 1 tablet by mouth 2 (two) times daily.     Not Delegated - Psychiatry:  Stimulants/ADHD Failed - 12/26/2020  1:46 PM      Failed - This refill cannot be delegated      Failed - Urine Drug Screen completed in last 360 days      Passed - Valid encounter within last 3 months    Recent Outpatient Visits           1 month ago Intractable migraine without aura and with status migrainosus   [facility] [name], FNP   6 months ago Annual physical exam   [place], [place]   9 months ago Acute recurrent pansinusitis   [facility], [place], [place]   9 months ago No-show for appointment   [facility], [place], [place]   1 year ago Acute non-recurrent pansinusitis   [facility], [place], [place]       Future Appointments             In 5 months Ralene Bathe, MD [place]   In 5 months [name], [place], [place]

## 2020-12-27 ENCOUNTER — Other Ambulatory Visit: Payer: Self-pay | Admitting: Family Medicine

## 2020-12-27 ENCOUNTER — Telehealth: Payer: Self-pay | Admitting: *Deleted

## 2020-12-27 DIAGNOSIS — G43011 Migraine without aura, intractable, with status migrainosus: Secondary | ICD-10-CM

## 2020-12-27 DIAGNOSIS — K219 Gastro-esophageal reflux disease without esophagitis: Secondary | ICD-10-CM

## 2020-12-27 DIAGNOSIS — R609 Edema, unspecified: Secondary | ICD-10-CM

## 2020-12-27 MED ORDER — AMPHETAMINE-DEXTROAMPHETAMINE 30 MG PO TABS: 1.0000 | ORAL_TABLET | Freq: Two times a day (BID) | ORAL | 0 refills | Status: DC

## 2020-12-27 NOTE — Telephone Encounter (Signed)
   Telephone encounter was:  Unsuccessful.  12/27/2020 Name: Kathleen Buchanan MRN: [id number] DOB: Oct 17, 1965  Unsuccessful outbound call made today to assist with:  Transportation Needs  and social activity  Outreach Attempt:  3rd Attempt.  Referral closed unable to contact patient.  A HIPAA compliant voice message was left requesting a return call.  Instructed patient to call back at   Instructed patient to call back at [phone]  at their earliest convenience.   [place], Care Management  [phone] [address] , [place] [address] Email : [email or web address]

## 2021-01-08 DIAGNOSIS — E7800 Pure hypercholesterolemia, unspecified: Secondary | ICD-10-CM

## 2021-01-18 ENCOUNTER — Telehealth: Payer: Self-pay | Admitting: Family Medicine

## 2021-01-18 DIAGNOSIS — J4520 Mild intermittent asthma, uncomplicated: Secondary | ICD-10-CM

## 2021-01-18 MED ORDER — ALBUTEROL SULFATE HFA 108 (90 BASE) MCG/ACT IN AERS: INHALATION_SPRAY | RESPIRATORY_TRACT | 2 refills | Status: DC

## 2021-01-18 NOTE — Telephone Encounter (Signed)
[facility] the following medications:   albuterol (VENTOLIN HFA) 108 (90 Base) MCG/ACT inhaler    Please advise.  

## 2021-01-19 ENCOUNTER — Telehealth: Payer: Self-pay

## 2021-01-19 NOTE — Telephone Encounter (Signed)
  Care Management   Follow Up Note   01/19/2021 Name: MATIKA BARTELL MRN: [id number] DOB: 07/24/1965   Primary Care Provider: [name], FNP Reason for referral : Chronic Care Management   An unsuccessful telephone outreach was attempted today. The patient was referred to the case management team for assistance with care management and care coordination.    Follow Up Plan:  A HIPAA compliant voice message was left today requesting a return call.   [facility] Care Management [facility] [phone]

## 2021-01-26 ENCOUNTER — Other Ambulatory Visit: Payer: Self-pay | Admitting: Family Medicine

## 2021-01-26 DIAGNOSIS — M778 Other enthesopathies, not elsewhere classified: Secondary | ICD-10-CM

## 2021-01-26 DIAGNOSIS — F419 Anxiety disorder, unspecified: Secondary | ICD-10-CM

## 2021-01-26 DIAGNOSIS — F900 Attention-deficit hyperactivity disorder, predominantly inattentive type: Secondary | ICD-10-CM

## 2021-01-26 NOTE — Telephone Encounter (Signed)
Requested medications are due for refill today yes, rx written 30 days ago, do not have actual fill date  Requested medications are on the active medication list yes  Last refill unsure but current rx is 48 days old  Last visit Seen 11/23/20 for migraines, 02/22/2019 for ADD, do no see xanax addressed in OV note  Future visit scheduled two upcoming appts, 02/06/21 and 06/01/21  Notes to clinic This medication can not be delegated, please assess.  Requested Prescriptions  Pending Prescriptions Disp Refills   ALPRAZolam (XANAX) 1 MG tablet 60 tablet 0    Sig: Take 1 tablet (1 mg total) by mouth 2 (two) times daily.     Not Delegated - Psychiatry:  Anxiolytics/Hypnotics Failed - 01/26/2021  6:22 PM      Failed - This refill cannot be delegated      Failed - Urine Drug Screen completed in last 360 days      Passed - Valid encounter within last 6 months    Recent Outpatient Visits           2 months ago Intractable migraine without aura and with status migrainosus   [facility] [name], FNP   7 months ago Annual physical exam   [facility] [place], [place]   10 months ago Acute recurrent pansinusitis   [facility] [place], [place], [place]   10 months ago No-show for appointment   [facility], [place], [place]   1 year ago Acute non-recurrent pansinusitis   [place], [place]       Future Appointments             In 1 week [name], [place], [employer]   In 4 months Ralene Bathe, MD [place]   In 4 months [name], [place], [facility]             amphetamine-dextroamphetamine (ADDERALL) 30 MG tablet 60 tablet 0    Sig: Take 1 tablet by mouth 2 (two) times daily.     Not Delegated - Psychiatry:  Stimulants/ADHD Failed - 01/26/2021  6:22 PM      Failed - This refill cannot be delegated       Failed - Urine Drug Screen completed in last 360 days      Passed - Valid encounter within last 3 months    Recent Outpatient Visits           2 months ago Intractable migraine without aura and with status migrainosus   [facility] [name], FNP   7 months ago Annual physical exam   [place], [place]   10 months ago Acute recurrent pansinusitis   [facility] [place], [place]   10 months ago No-show for appointment   [facility], [place], [place]   1 year ago Acute non-recurrent pansinusitis   [place], [place]       Future Appointments             In 1 week [name], [place], [place]   In 4 months Ralene Bathe, MD [place]   In 4 months [name], [place], [facility]             oxyCODONE-acetaminophen (ENDOCET) 7.5-325 MG tablet 60 tablet 0  Sig: Take 1 tablet by mouth 2 (two) times daily.     Not Delegated - Analgesics:  Opioid Agonist Combinations Failed - 01/26/2021  6:22 PM      Failed - This refill cannot be delegated      Failed - Urine Drug Screen completed in last 360 days      Passed - Valid encounter within last 6 months    Recent Outpatient Visits           2 months ago Intractable migraine without aura and with status migrainosus   [facility] [name], FNP   7 months ago Annual physical exam   [place], [place]   10 months ago Acute recurrent pansinusitis   [facility] [place], [place], [place]   10 months ago No-show for appointment   [facility], [place], [place]   1 year ago Acute non-recurrent pansinusitis   [facility], [place], [place]       Future Appointments             In 1 week [name], [place], [place]   In 4 months Ralene Bathe, MD [place]   In 4 months [name], [place], [place]

## 2021-01-26 NOTE — Telephone Encounter (Signed)
Medication: amphetamine-dextroamphetamine (ADDERALL) 30 MG tablet [[id number]] , oxyCODONE-acetaminophen (ENDOCET) 7.5-325 MG tablet [[id number]] , ALPRAZolam (XANAX) 1 MG tablet [[id number]]   Has the patient contacted their pharmacy? YES - Advised to contact the office for an appt (Agent: If no, request that the patient contact the pharmacy for the refill. If patient does not wish to contact the pharmacy document the reason why and proceed with request.) (Agent: If yes, when and what did the pharmacy advise?)  Preferred Pharmacy (with phone number or street name): [id number] [place], [place] [address] Phone: [phone] Fax: [phone] Hours: Not open 24 hours   Has the patient been seen for an appointment in the last year OR does the patient have an upcoming appointment? YES 02/06/21  Agent: Please be advised that RX refills may take up to 3 business days. We ask that you follow-up with your pharmacy.

## 2021-01-29 MED ORDER — ALPRAZOLAM 1 MG PO TABS: 1.0000 mg | ORAL_TABLET | Freq: Two times a day (BID) | ORAL | 0 refills | Status: DC

## 2021-01-29 MED ORDER — AMPHETAMINE-DEXTROAMPHETAMINE 30 MG PO TABS: 1.0000 | ORAL_TABLET | Freq: Two times a day (BID) | ORAL | 0 refills | Status: DC

## 2021-01-29 MED ORDER — OXYCODONE-ACETAMINOPHEN 7.5-325 MG PO TABS: 1.0000 | ORAL_TABLET | Freq: Two times a day (BID) | ORAL | 0 refills | Status: DC

## 2021-02-06 ENCOUNTER — Telehealth (INDEPENDENT_AMBULATORY_CARE_PROVIDER_SITE_OTHER): Payer: Medicare Other | Admitting: Family Medicine

## 2021-02-06 ENCOUNTER — Encounter: Payer: Self-pay | Admitting: Family Medicine

## 2021-02-06 ENCOUNTER — Other Ambulatory Visit: Payer: Self-pay

## 2021-02-06 DIAGNOSIS — R0683 Snoring: Secondary | ICD-10-CM | POA: Insufficient documentation

## 2021-02-06 DIAGNOSIS — G43011 Migraine without aura, intractable, with status migrainosus: Secondary | ICD-10-CM | POA: Diagnosis not present

## 2021-02-06 DIAGNOSIS — F17200 Nicotine dependence, unspecified, uncomplicated: Secondary | ICD-10-CM | POA: Diagnosis not present

## 2021-02-06 DIAGNOSIS — J324 Chronic pansinusitis: Secondary | ICD-10-CM

## 2021-02-06 MED ORDER — PREDNISONE 20 MG PO TABS: 20.0000 mg | ORAL_TABLET | Freq: Every day | ORAL | 0 refills | Status: DC

## 2021-02-06 MED ORDER — AMOXICILLIN-POT CLAVULANATE 875-125 MG PO TABS: 1.0000 | ORAL_TABLET | Freq: Two times a day (BID) | ORAL | 0 refills | Status: DC

## 2021-02-06 MED ORDER — SALINE SPRAY 0.65 % NA SOLN: 1.0000 | NASAL | 1 refills | Status: DC | PRN

## 2021-02-06 NOTE — Progress Notes (Signed)
MyChart Video Visit    Virtual Visit via Video Note   This visit type was conducted due to national recommendations for restrictions regarding the COVID-19 Pandemic (e.g. social distancing) in an effort to limit this patient's exposure and mitigate transmission in our community. This patient is at least at moderate risk for complications without adequate follow up. This format is felt to be most appropriate for this patient at this time. Physical exam was limited by quality of the video and audio technology used for the visit.   Patient location: home Provider location: [facility]  I discussed the limitations of evaluation and management by telemedicine and the availability of in person appointments. The patient expressed understanding and agreed to proceed.  Patient: Kathleen Buchanan   DOB: Apr 15, 1965   55 y.o. Female  MRN: [id number] Visit Date: 02/06/2021  Today's healthcare provider: [name], FNP   No chief complaint on file.  Subjective    HPI Patient following up on Anxiety and ADHD. She also c/o sinuses. Reports that she stopped taking the Propranolol because is not helping with her migraines. Anxiety, Follow-up   She reports excellent compliance with treatment. She reports good tolerance of treatment. She is not having side effects.   She feels her anxiety is moderate and Improved since last visit.  Symptoms: No chest pain No difficulty concentrating  No dizziness No fatigue  No feelings of losing control No insomnia  No irritable No palpitations  No panic attacks No racing thoughts  No shortness of breath No sweating  No tremors/shakes    GAD-7 Results GAD-7 Generalized Anxiety Disorder Screening Tool 02/06/2021  1. Feeling Nervous, Anxious, or on Edge 1  2. Not Being Able to Stop or Control Worrying 0  3. Worrying Too Much About Different Things 0  4. Trouble Relaxing 1  5. Being So Restless it's Hard To Sit Still 0  6. Becoming Easily Annoyed or Irritable  1  7. Feeling Afraid As If Something Awful Might Happen 0  Total GAD-7 Score 3  Difficulty At Work, Home, or Getting  Along With Others? Not difficult at all    PHQ-9 Scores PHQ9 SCORE ONLY 02/06/2021 11/27/2020 06/01/2020  PHQ-9 Total Score 0 1 7    ---------------------------------------------------------------------------------------------------  Upper respiratory symptoms/Sinus She complains of bilateral ear pressure/pain, sinus pressure, headaches, pressure under her eyes,post nasal drip with no fever, chills, night sweats or weight loss. Onset of symptoms was about a week ago and worsening.She is very dehydrated.  Past history is significant for asthma, occasional episodes of bronchitis, and pneumonia. Patient is smoker.  ---------------------------------------------------------------------------------------------------   Medications: Outpatient Medications Prior to Visit  Medication Sig   acetaminophen (TYLENOL) 500 MG tablet Take 1,000 mg by mouth every 6 (six) hours as needed.    albuterol (VENTOLIN HFA) 108 (90 Base) MCG/ACT inhaler INHALE 2 PUFFS INTO THE LUNGS EVERY 4 HOURS AS NEEDED FOR WHEEZE OR FOR SHORTNESS OF BREATH   ALPRAZolam (XANAX) 1 MG tablet Take 1 tablet (1 mg total) by mouth 2 (two) times daily.   amphetamine-dextroamphetamine (ADDERALL) 30 MG tablet Take 1 tablet by mouth 2 (two) times daily.   Aspirin-Caffeine (BC FAST PAIN RELIEF PO) Take by mouth as needed.   cetirizine (ZYRTEC) 10 MG tablet Take 10 mg by mouth daily.   hydrochlorothiazide (HYDRODIURIL) 12.5 MG tablet TAKE 1 TABLET BY MOUTH EVERY DAY   ibuprofen (ADVIL,MOTRIN) 200 MG tablet Take 4 tablets by mouth as needed.   levothyroxine (SYNTHROID) 25 MCG  tablet TAKE 1 TABLET BY MOUTH DAILY BEFORE BREAKFAST.   oxyCODONE-acetaminophen (ENDOCET) 7.5-325 MG tablet Take 1 tablet by mouth 2 (two) times daily.   pantoprazole (PROTONIX) 40 MG tablet TAKE 1 TABLET BY MOUTH EVERY DAY   rosuvastatin (CRESTOR) 10  MG tablet Take 1 tablet (10 mg total) by mouth daily.   [DISCONTINUED] sodium chloride (OCEAN) 0.65 % SOLN nasal spray Place 1 spray into both nostrils as needed for congestion.   ascorbic acid (VITAMIN C) 500 MG tablet Take 1,000 mg by mouth daily.    Blood Glucose Monitoring Suppl (ONETOUCH VERIO) w/Device KIT To check blood sugar daily for diabetes   glucose blood (ONETOUCH VERIO) test strip To check blood sugar daily for diabetes   Lancets (ONETOUCH ULTRASOFT) lancets To check blood sugar daily for diabetes   [DISCONTINUED] amoxicillin-clavulanate (AUGMENTIN) 875-125 MG tablet Take 1 tablet by mouth 2 (two) times daily.   [DISCONTINUED] Multiple Vitamin (MULTIVITAMIN) capsule Take 1 capsule by mouth daily. (Patient not taking: Reported on 11/27/2020)   [DISCONTINUED] Multiple Vitamins-Minerals (HAIR SKIN AND NAILS FORMULA PO) Take 1 tablet by mouth daily. (Patient not taking: Reported on 11/27/2020)   [DISCONTINUED] polyethylene glycol powder (GLYCOLAX/MIRALAX) powder Take 17 g by mouth 2 (two) times daily as needed. (Patient not taking: Reported on 11/27/2020)   [DISCONTINUED] propranolol (INDERAL) 20 MG tablet TAKE 1 TABLET BY MOUTH TWICE A DAY (Patient not taking: Reported on 02/06/2021)   No facility-administered medications prior to visit.    Review of Systems    Objective    There were no vitals taken for this visit.   Physical Exam Constitutional:      Appearance: She is obese.  HENT:     Nose: Congestion and rhinorrhea present.     Right Sinus: Maxillary sinus tenderness and frontal sinus tenderness present.     Left Sinus: Maxillary sinus tenderness and frontal sinus tenderness present.     Mouth/Throat:     Mouth: Mucous membranes are moist.  Pulmonary:     Effort: Pulmonary effort is normal.  Musculoskeletal:     Cervical back: Normal range of motion.  Neurological:     General: No focal deficit present.     Mental Status: She is alert.  Psychiatric:        Mood  and Affect: Mood normal.       Assessment & Plan     Problem List Items Addressed This Visit       Cardiovascular and Mediastinum   Intractable migraine without aura and with status migrainosus    Chronic migraines Occur every other day May last 24-48 hrs in length Often present upon awakening Failed propranolol use, tried for 5 weeks Limited caffeine; normal water intake (64 oz+); normal sleep habits Went off of BC powder for 3+ months and migraines did not change       Relevant Orders   Ambulatory referral to Neurology     Respiratory   Chronic pansinusitis - Primary    Second bout this Fall; recommend referral to ENT if s/s resume again Reinforced need to quit smoking to assist with pathogen processes and decrease inflammation and nasal constriction       Relevant Medications   amoxicillin-clavulanate (AUGMENTIN) 875-125 MG tablet   predniSONE (DELTASONE) 20 MG tablet   sodium chloride (OCEAN) 0.65 % SOLN nasal spray     Other   Tobacco dependence    Currently smoking for 7 cigarettes per day; continued to reinforce reduction with ultimate goal of  quitting      Loud snoring    Frequent headaches Denies sleeping difficulties Wakes self up d/t volume of snoring Plan for sleep study to evaluation potential hypoxia       Relevant Orders   Ambulatory referral to Sleep Studies    Return if symptoms worsen or fail to improve.     I discussed the assessment and treatment plan with the patient. The patient was provided an opportunity to ask questions and all were answered. The patient agreed with the plan and demonstrated an understanding of the instructions.   The patient was advised to call back or seek an in-person evaluation if the symptoms worsen or if the condition fails to improve as anticipated.  I provided 12 minutes of non-face-to-face time during this encounter.  Vonna Kotyk, FNP, have reviewed all documentation for this visit. The documentation  on 02/06/21 for the exam, diagnosis, procedures, and orders are all accurate and complete.   [name], [place] [phone] (phone) [phone] (fax)  [place]

## 2021-02-06 NOTE — Assessment & Plan Note (Signed)
Currently smoking for 7 cigarettes per day; continued to reinforce reduction with ultimate goal of quitting

## 2021-02-06 NOTE — Assessment & Plan Note (Signed)
Second bout this Fall; recommend referral to ENT if s/s resume again Reinforced need to quit smoking to assist with pathogen processes and decrease inflammation and nasal constriction

## 2021-02-06 NOTE — Assessment & Plan Note (Signed)
Chronic migraines Occur every other day May last 24-48 hrs in length Often present upon awakening Failed propranolol use, tried for 5 weeks Limited caffeine; normal water intake (64 oz+); normal sleep habits Went off of BC powder for 3+ months and migraines did not change

## 2021-02-06 NOTE — Assessment & Plan Note (Signed)
Frequent headaches Denies sleeping difficulties Wakes self up d/t volume of snoring Plan for sleep study to evaluation potential hypoxia

## 2021-02-08 ENCOUNTER — Other Ambulatory Visit: Payer: Self-pay | Admitting: Family Medicine

## 2021-02-08 DIAGNOSIS — R0683 Snoring: Secondary | ICD-10-CM

## 2021-02-08 DIAGNOSIS — G43011 Migraine without aura, intractable, with status migrainosus: Secondary | ICD-10-CM

## 2021-02-09 ENCOUNTER — Other Ambulatory Visit: Payer: Self-pay | Admitting: Family Medicine

## 2021-02-09 DIAGNOSIS — R0683 Snoring: Secondary | ICD-10-CM

## 2021-02-21 ENCOUNTER — Telehealth: Payer: Self-pay | Admitting: *Deleted

## 2021-02-21 NOTE — Chronic Care Management (AMB) (Signed)
°  Care Management   Note  02/21/2021 Name: ADILEE LEMME MRN: [id number] DOB: [birth year]  Mora Bellman Mundt is a 55 y.o. year old female who is a primary care patient of [name], FNP and is actively engaged with the care management team. I reached out to [employer] by phone today to assist with re-scheduling a follow up visit with the RN Case Manager  Follow up plan: Telephone appointment with care management team member scheduled for: 02/26/2021  Julian Hy, [place], [place] Management  Direct Dial: [phone]

## 2021-02-26 ENCOUNTER — Ambulatory Visit (INDEPENDENT_AMBULATORY_CARE_PROVIDER_SITE_OTHER): Payer: Medicare Other

## 2021-02-26 DIAGNOSIS — E7800 Pure hypercholesterolemia, unspecified: Secondary | ICD-10-CM

## 2021-02-26 DIAGNOSIS — J324 Chronic pansinusitis: Secondary | ICD-10-CM

## 2021-02-26 DIAGNOSIS — J449 Chronic obstructive pulmonary disease, unspecified: Secondary | ICD-10-CM

## 2021-02-26 NOTE — Chronic Care Management (AMB) (Signed)
Chronic Care Management   CCM RN Visit Note  02/26/2021 Name: Kathleen Buchanan MRN: [id number] DOB: [birth year]  Subjective: Kathleen Buchanan is a 55 y.o. year old female who is a primary care patient of [name], FNP. The care management team was consulted for assistance with disease management and care coordination needs.    Engaged with patient by telephone for follow up visit in response to provider referral for case management and care coordination services.   Consent to Services:  The patient was given information about Chronic Care Management services, agreed to services, and gave verbal consent prior to initiation of services.  Please see initial visit note for detailed documentation.   Assessment: Review of patient past medical history, allergies, medications, health status, including review of consultants reports, laboratory and other test data, was performed as part of comprehensive evaluation and provision of chronic care management services.   SDOH (Social Determinants of Health) assessments and interventions performed: No   CCM Care Plan  Allergies  Allergen Reactions   Macrobid [Nitrofurantoin Macrocrystal] Diarrhea   Codeine Hives   Erythromycin    Hydrocodone-Acetaminophen Hives   Lansoprazole    Levofloxacin    Migraine Formula  [Aspirin-Acetaminophen-Caffeine]    Morphine Sulfate    Omeprazole    Pepcid  [Famotidine]    Sulfa Antibiotics     Outpatient Encounter Medications as of 02/26/2021  Medication Sig Note   acetaminophen (TYLENOL) 500 MG tablet Take 1,000 mg by mouth every 6 (six) hours as needed.     albuterol (VENTOLIN HFA) 108 (90 Base) MCG/ACT inhaler INHALE 2 PUFFS INTO THE LUNGS EVERY 4 HOURS AS NEEDED FOR WHEEZE OR FOR SHORTNESS OF BREATH    ALPRAZolam (XANAX) 1 MG tablet Take 1 tablet (1 mg total) by mouth 2 (two) times daily.    amoxicillin-clavulanate (AUGMENTIN) 875-125 MG tablet Take 1 tablet by mouth 2 (two) times daily.     amphetamine-dextroamphetamine (ADDERALL) 30 MG tablet Take 1 tablet by mouth 2 (two) times daily.    ascorbic acid (VITAMIN C) 500 MG tablet Take 1,000 mg by mouth daily.  09/08/2014: Received from: [place]: Take by mouth.   Aspirin-Caffeine (BC FAST PAIN RELIEF PO) Take by mouth as needed.    Blood Glucose Monitoring Suppl (ONETOUCH VERIO) w/Device KIT To check blood sugar daily for diabetes    cetirizine (ZYRTEC) 10 MG tablet Take 10 mg by mouth daily.    glucose blood (ONETOUCH VERIO) test strip To check blood sugar daily for diabetes    hydrochlorothiazide (HYDRODIURIL) 12.5 MG tablet TAKE 1 TABLET BY MOUTH EVERY DAY    ibuprofen (ADVIL,MOTRIN) 200 MG tablet Take 4 tablets by mouth as needed. 09/08/2014: Medication taken as needed.  Received from: [place]: Take by mouth.   Lancets (ONETOUCH ULTRASOFT) lancets To check blood sugar daily for diabetes    levothyroxine (SYNTHROID) 25 MCG tablet TAKE 1 TABLET BY MOUTH DAILY BEFORE BREAKFAST.    oxyCODONE-acetaminophen (ENDOCET) 7.5-325 MG tablet Take 1 tablet by mouth 2 (two) times daily.    pantoprazole (PROTONIX) 40 MG tablet TAKE 1 TABLET BY MOUTH EVERY DAY    predniSONE (DELTASONE) 20 MG tablet Take 1 tablet (20 mg total) by mouth daily with breakfast.    rosuvastatin (CRESTOR) 10 MG tablet Take 1 tablet (10 mg total) by mouth daily.    sodium chloride (OCEAN) 0.65 % SOLN nasal spray Place 1 spray into both nostrils as needed for congestion.  No facility-administered encounter medications on file as of 02/26/2021.  ° ° °Patient Active Problem List  ° Diagnosis Date Noted  ° Loud snoring 02/06/2021  ° Hypercholesterolemia 12/15/2020  ° Xanthelasma of eyelid 12/15/2020  ° Intractable migraine without aura and with status migrainosus 11/23/2020  ° Acute diffuse otitis externa of both ears 11/23/2020  ° Chronic pansinusitis 11/23/2020  ° Encounter for smoking cessation counseling  11/23/2020  ° Depression, major, single episode, severe (HCC) 06/01/2020  ° Menopausal syndrome (hot flashes) 08/13/2016  ° Tobacco dependence 08/13/2016  ° Family history of diabetes mellitus in mother 07/10/2016  ° Chronic low back pain 11/08/2014  ° Onychomycosis 09/15/2014  ° ADD (attention deficit disorder) 09/08/2014  ° Ankle pain 09/08/2014  ° Ache in joint 09/08/2014  ° Anxiety 09/08/2014  ° Airway hyperreactivity 09/08/2014  ° Ankle fracture 09/08/2014  ° Back ache 09/08/2014  ° Bursitis of hip 09/08/2014  ° Chronic anemia 09/08/2014  ° Chronic constipation 09/08/2014  ° CAFL (chronic airflow limitation) (HCC) 09/08/2014  ° Accumulation of fluid in tissues 09/08/2014  ° Biliary sludge 09/08/2014  ° Acid reflux 09/08/2014  ° History of migraine headaches 09/08/2014  ° Hypercholesteremia 09/08/2014  ° Adiposity 09/08/2014  ° Episodic paroxysmal anxiety disorder 09/08/2014  ° Scoliosis 09/08/2014  ° Tendinitis of wrist 09/08/2014  ° Big thyroid 09/08/2014  ° Current tobacco use 09/08/2014  ° ° °Patient Care Plan: RN Care Management Plan of Care  °  ° °Problem Identified: CAFL, HLD   °  ° °Long-Range Goal: Disease Progression Prevented or Minimized   °Start Date: 02/26/2021  °Expected End Date: 05/27/2021  °Priority: High  °Note:   °Current Barriers:  °Chronic Disease Management support and education needs related to HLD and Chronic Airflow Limitation. ° °RNCM Clinical Goal(s):  °Patient will demonstrate ongoing adherence to prescribed treatment plan for HLD and Chronic Airflow Limitation through collaboration with the provider, RN Care manager and the care team.  ° °Interventions: °1:1 collaboration with primary care provider regarding development and update of comprehensive plan of care as evidenced by provider attestation and co-signature °Inter-disciplinary care team collaboration (see longitudinal plan of care) °Evaluation of current treatment plan related to  self management and patient's adherence to plan  as established by provider ° ° °Hyperlipidemia Interventions:   °Lab Results  °Component Value Date  ° CHOL 265 (H) 06/01/2020  ° HDL 38 (L) 06/01/2020  ° LDLCALC 191 (H) 06/01/2020  ° TRIG 190 (H) 06/01/2020  ° CHOLHDL 6.9 (H) 01/16/2018  °Medications reviewed.  °Reviewed provider established cholesterol goals. °Discussed importance of completing regular laboratory monitoring as ordered. °Reviewed role and benefits of statin for ASCVD risk reduction. °Reviewed importance of limiting foods high in cholesterol. °Discuss plan regarding Dermatology referral. She previously expressed concerns regarding treatment options for xanthelasma. Confirmed speaking with the Dermatology team and scheduling the initial evaluation. She will be evaluated in March. ° °Chronic Airflow Limitation: °Reviewed information regarding self care/management/and exacerbation prevention. Reports recently being treated for a severe sinus infection. Reports completing prednisone and full course of antibiotics as prescribed. Denies episodes of shortness of breath but still experiencing congestion. Reports using inhaler as prescribed. Thoroughly reviewed indications for seeking immediate medical attention. She is agreeable to a clinic follow up if the symptoms do not resolve. °Discussed plan for ENT evaluation. Reports she was not able to schedule the initial visit. Will follow up with the ENT team to determine if a new referral is required.  °Reviewed importance of daily self assessment.  °  Reviewed information related to respiratory infection prevention. Advised to utilize  prevention strategies to reduce risk of respiratory infection  °Reviewed risk relate to daily cigarette use. Currently not interested in counseling. Advised to consider plan for smoking cessation. °Thoroughly reviewed worsening symptoms that require immediate medical attention.  ° °Patient Goals/Self-Care Activities: °Take all medications as prescribed °Attend all scheduled  provider appointments °Call pharmacy for medication refills 3-7 days in advance of running out of medications °Perform all self care activities independently  °Call provider office for new concerns or questions  ° °Follow Up Plan:   °Will follow up within the next week ° °  °  ° ° °PLAN °A member of the care management team will follow up within the next week. ° ° °Felecia McCray,RN °[facility] Care Management °[facility] °[phone] ° ° ° ° ° ° ° ° °

## 2021-02-26 NOTE — Patient Instructions (Addendum)
Thank you for allowing the Chronic Care Management team to participate in your care. It was great speaking with you today! °

## 2021-02-27 ENCOUNTER — Ambulatory Visit: Payer: Self-pay

## 2021-02-27 DIAGNOSIS — J449 Chronic obstructive pulmonary disease, unspecified: Secondary | ICD-10-CM

## 2021-02-27 DIAGNOSIS — J324 Chronic pansinusitis: Secondary | ICD-10-CM

## 2021-02-27 DIAGNOSIS — E7800 Pure hypercholesterolemia, unspecified: Secondary | ICD-10-CM

## 2021-02-27 NOTE — Chronic Care Management (AMB) (Signed)
Chronic Care Management   CCM RN Visit Note  02/27/2021 Name: Kathleen Buchanan MRN: [id number] DOB: May 01, 1965  Subjective: Kathleen Buchanan is a 55 y.o. year old female who is a primary care patient of [name], FNP. The care management team was consulted for assistance with disease management and care coordination needs.    Engaged with patient by telephone for follow up visit in response to provider referral for case management and care coordination services.   Consent to Services:  The patient was given information about Chronic Care Management services, agreed to services, and gave verbal consent prior to initiation of services.  Please see initial visit note for detailed documentation.   Assessment: Review of patient past medical history, allergies, medications, health status, including review of consultants reports, laboratory and other test data, was performed as part of comprehensive evaluation and provision of chronic care management services.   SDOH (Social Determinants of Health) assessments and interventions performed:  No  CCM Care Plan  Allergies  Allergen Reactions   Macrobid [Nitrofurantoin Macrocrystal] Diarrhea   Codeine Hives   Erythromycin    Hydrocodone-Acetaminophen Hives   Lansoprazole    Levofloxacin    Migraine Formula  [Aspirin-Acetaminophen-Caffeine]    Morphine Sulfate    Omeprazole    Pepcid  [Famotidine]    Sulfa Antibiotics     Outpatient Encounter Medications as of 02/27/2021  Medication Sig Note   acetaminophen (TYLENOL) 500 MG tablet Take 1,000 mg by mouth every 6 (six) hours as needed.     albuterol (VENTOLIN HFA) 108 (90 Base) MCG/ACT inhaler INHALE 2 PUFFS INTO THE LUNGS EVERY 4 HOURS AS NEEDED FOR WHEEZE OR FOR SHORTNESS OF BREATH    ALPRAZolam (XANAX) 1 MG tablet Take 1 tablet (1 mg total) by mouth 2 (two) times daily.    amoxicillin-clavulanate (AUGMENTIN) 875-125 MG tablet Take 1 tablet by mouth 2 (two) times daily.     amphetamine-dextroamphetamine (ADDERALL) 30 MG tablet Take 1 tablet by mouth 2 (two) times daily.    ascorbic acid (VITAMIN C) 500 MG tablet Take 1,000 mg by mouth daily.  09/08/2014: Received from: [place]: Take by mouth.   Aspirin-Caffeine (BC FAST PAIN RELIEF PO) Take by mouth as needed.    Blood Glucose Monitoring Suppl (ONETOUCH VERIO) w/Device KIT To check blood sugar daily for diabetes    cetirizine (ZYRTEC) 10 MG tablet Take 10 mg by mouth daily.    glucose blood (ONETOUCH VERIO) test strip To check blood sugar daily for diabetes    hydrochlorothiazide (HYDRODIURIL) 12.5 MG tablet TAKE 1 TABLET BY MOUTH EVERY DAY    ibuprofen (ADVIL,MOTRIN) 200 MG tablet Take 4 tablets by mouth as needed. 09/08/2014: Medication taken as needed.  Received from: [place]: Take by mouth.   Lancets (ONETOUCH ULTRASOFT) lancets To check blood sugar daily for diabetes    levothyroxine (SYNTHROID) 25 MCG tablet TAKE 1 TABLET BY MOUTH DAILY BEFORE BREAKFAST.    oxyCODONE-acetaminophen (ENDOCET) 7.5-325 MG tablet Take 1 tablet by mouth 2 (two) times daily.    pantoprazole (PROTONIX) 40 MG tablet TAKE 1 TABLET BY MOUTH EVERY DAY    predniSONE (DELTASONE) 20 MG tablet Take 1 tablet (20 mg total) by mouth daily with breakfast.    rosuvastatin (CRESTOR) 10 MG tablet Take 1 tablet (10 mg total) by mouth daily.    sodium chloride (OCEAN) 0.65 % SOLN nasal spray Place 1 spray into both nostrils as needed for congestion.  No facility-administered encounter medications on file as of 02/27/2021.    Patient Active Problem List   Diagnosis Date Noted   Loud snoring 02/06/2021   Hypercholesterolemia 12/15/2020   Xanthelasma of eyelid 12/15/2020   Intractable migraine without aura and with status migrainosus 11/23/2020   Acute diffuse otitis externa of both ears 11/23/2020   Chronic pansinusitis 11/23/2020   Encounter for smoking cessation counseling  11/23/2020   Depression, major, single episode, severe ([place]) 06/01/2020   Menopausal syndrome (hot flashes) 08/13/2016   Tobacco dependence 08/13/2016   Family history of diabetes mellitus in mother 07/10/2016   Chronic low back pain 11/08/2014   Onychomycosis 09/15/2014   ADD (attention deficit disorder) 09/08/2014   Ankle pain 09/08/2014   Ache in joint 09/08/2014   Anxiety 09/08/2014   Airway hyperreactivity 09/08/2014   Ankle fracture 09/08/2014   Back ache 09/08/2014   Bursitis of hip 09/08/2014   Chronic anemia 09/08/2014   Chronic constipation 09/08/2014   CAFL (chronic airflow limitation) ([place]) 09/08/2014   Accumulation of fluid in tissues 09/08/2014   Biliary sludge 09/08/2014   Acid reflux 09/08/2014   History of migraine headaches 09/08/2014   Hypercholesteremia 09/08/2014   Adiposity 09/08/2014   Episodic paroxysmal anxiety disorder 09/08/2014   Scoliosis 09/08/2014   Tendinitis of wrist 09/08/2014   Big thyroid 09/08/2014   Current tobacco use 09/08/2014     Patient Care Plan: RN Care Management Plan of Care     Problem Identified: CAFL, HLD      Long-Range Goal: Disease Progression Prevented or Minimized   Start Date: 02/26/2021  Expected End Date: 05/27/2021  Priority: High  Note:   Current Barriers:  Chronic Disease Management support and education needs related to HLD and Chronic Airflow Limitation.  RNCM Clinical Goal(s):  Patient will demonstrate ongoing adherence to prescribed treatment plan for HLD and Chronic Airflow Limitation through collaboration with the provider, RN Care manager and the care team.   Interventions: 1:1 collaboration with primary care provider regarding development and update of comprehensive plan of care as evidenced by provider attestation and co-signature Inter-disciplinary care team collaboration (see longitudinal plan of care) Evaluation of current treatment plan related to  self management and patient's adherence to  plan as established by provider   Hyperlipidemia Interventions:   Lab Results  Component Value Date   CHOL 265 (H) 06/01/2020   HDL 38 (L) 06/01/2020   LDLCALC 191 (H) 06/01/2020   TRIG 190 (H) 06/01/2020   CHOLHDL 6.9 (H) 01/16/2018  Medications reviewed.  Reviewed provider established cholesterol goals. Discussed importance of completing regular laboratory monitoring as ordered. Reviewed role and benefits of statin for ASCVD risk reduction. Reviewed importance of limiting foods high in cholesterol. Discuss plan regarding Dermatology referral. She previously expressed concerns regarding treatment options for xanthelasma. Confirmed speaking with the Dermatology team and scheduling the initial evaluation. She will be evaluated in March.  Chronic Airflow Limitation: Reviewed information regarding self care/management/and exacerbation prevention. Reports recently being treated for a severe sinus infection. Reports completing prednisone and full course of antibiotics as prescribed. Denies episodes of shortness of breath but still experiencing congestion. Reports using inhaler as prescribed. Thoroughly reviewed indications for seeking immediate medical attention. She is agreeable to a clinic follow up if the symptoms do not resolve. Discussed plan for ENT evaluation. Reports she was not able to schedule the initial visit. Will follow up with the ENT team to determine if a new referral is required.  Reviewed importance of daily self  assessment.  Reviewed information related to respiratory infection prevention. Advised to utilize  prevention strategies to reduce risk of respiratory infection  Reviewed risk relate to daily cigarette use. Currently not interested in counseling. Advised to consider plan for smoking cessation. Thoroughly reviewed worsening symptoms that require immediate medical attention.   Patient Goals/Self-Care Activities: Take all medications as prescribed Attend all scheduled  provider appointments Call pharmacy for medication refills 3-7 days in advance of running out of medications Perform all self care activities independently  Call provider office for new concerns or questions   Follow Up Plan:   Will follow up within the next week       PLAN A member of the care management team will follow up within the next week.   [facility] [phone]

## 2021-02-28 ENCOUNTER — Other Ambulatory Visit: Payer: Self-pay | Admitting: Family Medicine

## 2021-02-28 DIAGNOSIS — F419 Anxiety disorder, unspecified: Secondary | ICD-10-CM

## 2021-02-28 DIAGNOSIS — M778 Other enthesopathies, not elsewhere classified: Secondary | ICD-10-CM

## 2021-02-28 DIAGNOSIS — F900 Attention-deficit hyperactivity disorder, predominantly inattentive type: Secondary | ICD-10-CM

## 2021-02-28 NOTE — Telephone Encounter (Signed)
Medication Refill - Medication: ALPRAZolam (XANAX) 1 MG tablet   oxyCODONE-acetaminophen (ENDOCET) 7.5-325 MG tablet  amphetamine-dextroamphetamine (ADDERALL) 30 MG tablet   amoxicillin-clavulanate (AUGMENTIN) 875-125 MG tablet / Pt states she would like a refill for the antibiotic to finish clearing up her infection that has not totally gone away   Pt stated she called a couple days ago and spoke with a nurse that was suppose to send a message about the refills and about the antibiotic refill / please advise   Has the patient contacted their pharmacy? Yes.   (Agent: If no, request that the patient contact the pharmacy for the refill. If patient does not wish to contact the pharmacy document the reason why and proceed with request.) (Agent: If yes, when and what did the pharmacy advise?)  Preferred Pharmacy (with phone number or street name): [id number] - [place], [address]  [address] [place] [address]  Phone:  [phone]  Fax:  [phone]  Has the patient been seen for an appointment in the last year OR does the patient have an upcoming appointment? Yes.    Agent: Please be advised that RX refills may take up to 3 business days. We ask that you follow-up with your pharmacy.

## 2021-02-28 NOTE — Telephone Encounter (Signed)
Refills cannot be delegated. °

## 2021-03-01 ENCOUNTER — Other Ambulatory Visit: Payer: Self-pay | Admitting: Family Medicine

## 2021-03-01 ENCOUNTER — Ambulatory Visit: Payer: Self-pay

## 2021-03-01 DIAGNOSIS — F900 Attention-deficit hyperactivity disorder, predominantly inattentive type: Secondary | ICD-10-CM

## 2021-03-01 DIAGNOSIS — J324 Chronic pansinusitis: Secondary | ICD-10-CM

## 2021-03-01 DIAGNOSIS — J449 Chronic obstructive pulmonary disease, unspecified: Secondary | ICD-10-CM

## 2021-03-01 DIAGNOSIS — M778 Other enthesopathies, not elsewhere classified: Secondary | ICD-10-CM

## 2021-03-01 DIAGNOSIS — F419 Anxiety disorder, unspecified: Secondary | ICD-10-CM

## 2021-03-01 MED ORDER — ALPRAZOLAM 1 MG PO TABS: 1.0000 mg | ORAL_TABLET | Freq: Two times a day (BID) | ORAL | 0 refills | Status: DC

## 2021-03-01 MED ORDER — OXYCODONE-ACETAMINOPHEN 7.5-325 MG PO TABS: 1.0000 | ORAL_TABLET | Freq: Two times a day (BID) | ORAL | 0 refills | Status: DC

## 2021-03-01 MED ORDER — AMPHETAMINE-DEXTROAMPHETAMINE 30 MG PO TABS: 1.0000 | ORAL_TABLET | Freq: Two times a day (BID) | ORAL | 0 refills | Status: DC

## 2021-03-01 NOTE — Chronic Care Management (AMB) (Signed)
Error Please Disregard

## 2021-03-06 ENCOUNTER — Encounter: Payer: Self-pay | Admitting: Family Medicine

## 2021-03-06 ENCOUNTER — Telehealth (INDEPENDENT_AMBULATORY_CARE_PROVIDER_SITE_OTHER): Payer: Medicare Other | Admitting: Family Medicine

## 2021-03-06 ENCOUNTER — Other Ambulatory Visit: Payer: Self-pay

## 2021-03-06 DIAGNOSIS — J324 Chronic pansinusitis: Secondary | ICD-10-CM

## 2021-03-06 DIAGNOSIS — R053 Chronic cough: Secondary | ICD-10-CM | POA: Diagnosis not present

## 2021-03-06 DIAGNOSIS — Z720 Tobacco use: Secondary | ICD-10-CM

## 2021-03-06 MED ORDER — AMOXICILLIN-POT CLAVULANATE 875-125 MG PO TABS: 1.0000 | ORAL_TABLET | Freq: Two times a day (BID) | ORAL | 0 refills | Status: DC

## 2021-03-06 NOTE — Assessment & Plan Note (Signed)
Encouraged to wean tobacco- pt report 7 cigs/day

## 2021-03-06 NOTE — Assessment & Plan Note (Addendum)
Repeat augmentin, per pt allergy list Reports allergy to other ABX not listed including doxycycline  Pt lives alone- has phone number to schedule with ENT and plans to do so

## 2021-03-06 NOTE — Assessment & Plan Note (Signed)
Multi focal, sinus and tobacco Encouraged to wean tobacco- pt report 7 cigs/day

## 2021-03-06 NOTE — Progress Notes (Signed)
MyChart Video Visit    Virtual Visit via Video Note   This visit type was conducted due to national recommendations for restrictions regarding the COVID-19 Pandemic (e.g. social distancing) in an effort to limit this patient's exposure and mitigate transmission in our community. This patient is at least at moderate risk for complications without adequate follow up. This format is felt to be most appropriate for this patient at this time. Physical exam was limited by quality of the video and audio technology used for the visit.   Patient location: home, couch Provider location: [facility]  I discussed the limitations of evaluation and management by telemedicine and the availability of in person appointments. The patient expressed understanding and agreed to proceed.  Patient: Kathleen Buchanan   DOB: 09-02-1965   55 y.o. Female  MRN: [id number] Visit Date: 03/06/2021  Today's healthcare provider: [name], FNP   Chief Complaint  Patient presents with   Sinus Problem   Subjective    Sinus Problem This is a new problem. The current episode started in the past 7 days. The problem has been gradually worsening since onset. There has been no fever. Associated symptoms include congestion, coughing, ear pain, headaches, sinus pressure, sneezing, a sore throat and swollen glands. Pertinent negatives include no chills, diaphoresis, hoarse voice, neck pain or shortness of breath. Treatments tried: Zyrtec and Tylenol Sinus. The treatment provided no relief.      Medications: Outpatient Medications Prior to Visit  Medication Sig   acetaminophen (TYLENOL) 500 MG tablet Take 1,000 mg by mouth every 6 (six) hours as needed.    albuterol (VENTOLIN HFA) 108 (90 Base) MCG/ACT inhaler INHALE 2 PUFFS INTO THE LUNGS EVERY 4 HOURS AS NEEDED FOR WHEEZE OR FOR SHORTNESS OF BREATH   ALPRAZolam (XANAX) 1 MG tablet Take 1 tablet (1 mg total) by mouth 2 (two) times daily.   amphetamine-dextroamphetamine  (ADDERALL) 30 MG tablet Take 1 tablet by mouth 2 (two) times daily.   ascorbic acid (VITAMIN C) 500 MG tablet Take 1,000 mg by mouth daily.    Aspirin-Caffeine (BC FAST PAIN RELIEF PO) Take by mouth as needed.   Blood Glucose Monitoring Suppl (ONETOUCH VERIO) w/Device KIT To check blood sugar daily for diabetes   cetirizine (ZYRTEC) 10 MG tablet Take 10 mg by mouth daily.   glucose blood (ONETOUCH VERIO) test strip To check blood sugar daily for diabetes   hydrochlorothiazide (HYDRODIURIL) 12.5 MG tablet TAKE 1 TABLET BY MOUTH EVERY DAY   ibuprofen (ADVIL,MOTRIN) 200 MG tablet Take 4 tablets by mouth as needed.   Lancets (ONETOUCH ULTRASOFT) lancets To check blood sugar daily for diabetes   levothyroxine (SYNTHROID) 25 MCG tablet TAKE 1 TABLET BY MOUTH DAILY BEFORE BREAKFAST.   oxyCODONE-acetaminophen (ENDOCET) 7.5-325 MG tablet Take 1 tablet by mouth 2 (two) times daily.   pantoprazole (PROTONIX) 40 MG tablet TAKE 1 TABLET BY MOUTH EVERY DAY   predniSONE (DELTASONE) 20 MG tablet Take 1 tablet (20 mg total) by mouth daily with breakfast.   rosuvastatin (CRESTOR) 10 MG tablet Take 1 tablet (10 mg total) by mouth daily.   sodium chloride (OCEAN) 0.65 % SOLN nasal spray Place 1 spray into both nostrils as needed for congestion.   [DISCONTINUED] amoxicillin-clavulanate (AUGMENTIN) 875-125 MG tablet Take 1 tablet by mouth 2 (two) times daily.   No facility-administered medications prior to visit.    Review of Systems  Constitutional:  Negative for chills and diaphoresis.  HENT:  Positive for congestion, ear  pain, sinus pressure, sneezing and sore throat. Negative for hoarse voice.   Respiratory:  Positive for cough. Negative for shortness of breath.   Musculoskeletal:  Negative for neck pain.  Neurological:  Positive for headaches.     Objective    There were no vitals taken for this visit.   Physical Exam Constitutional:      Appearance: Normal appearance. She is obese. She is  ill-appearing.  Pulmonary:     Effort: Pulmonary effort is normal. No respiratory distress.  Neurological:     Mental Status: She is alert.  Psychiatric:        Mood and Affect: Mood normal.        Behavior: Behavior normal.        Thought Content: Thought content normal.        Judgment: Judgment normal.       Assessment & Plan     Problem List Items Addressed This Visit       Respiratory   Chronic pansinusitis - Primary    Repeat augmentin, per pt allergy list Reports allergy to other ABX not listed including doxycycline  Pt lives alone- has phone number to schedule with ENT and plans to do so      Relevant Medications   amoxicillin-clavulanate (AUGMENTIN) 875-125 MG tablet     Other   Tobacco abuse    Encouraged to wean tobacco- pt report 7 cigs/day      Chronic cough    Multi focal, sinus and tobacco Encouraged to wean tobacco- pt report 7 cigs/day        Return if symptoms worsen or fail to improve.     I discussed the assessment and treatment plan with the patient. The patient was provided an opportunity to ask questions and all were answered. The patient agreed with the plan and demonstrated an understanding of the instructions.   The patient was advised to call back or seek an in-person evaluation if the symptoms worsen or if the condition fails to improve as anticipated.  I provided 8 minutes of non-face-to-face time during this encounter.  Vonna Kotyk, FNP, have reviewed all documentation for this visit. The documentation on 03/06/21 for the exam, diagnosis, procedures, and orders are all accurate and complete.   [name], [place] [phone] (phone) [phone] (fax)  [place]

## 2021-03-06 NOTE — Patient Instructions (Signed)
Thank you for allowing the Chronic Care Management team to participate in your care.  

## 2021-03-10 DIAGNOSIS — E7800 Pure hypercholesterolemia, unspecified: Secondary | ICD-10-CM

## 2021-03-10 DIAGNOSIS — J449 Chronic obstructive pulmonary disease, unspecified: Secondary | ICD-10-CM

## 2021-03-12 NOTE — Chronic Care Management (AMB) (Signed)
Chronic Care Management   CCM RN Visit Note   Name: Kathleen Buchanan MRN: [id number] DOB: 02/14/1966  Subjective: Kathleen Buchanan is a 56 y.o. year old female who is a primary care patient of [name], FNP. The care management team was consulted for assistance with disease management and care coordination needs.    Engaged with patient by telephone for follow up visit in response to provider referral for case management and care coordination services.   Consent to Services:  The patient was given information about Chronic Care Management services, agreed to services, and gave verbal consent prior to initiation of services.  Please see initial visit note for detailed documentation.   Assessment: Review of patient past medical history, allergies, medications, health status, including review of consultants reports, laboratory and other test data, was performed as part of comprehensive evaluation and provision of chronic care management services.   SDOH (Social Determinants of Health) assessments and interventions performed: No   CCM Care Plan  Allergies  Allergen Reactions   Macrobid [Nitrofurantoin Macrocrystal] Diarrhea   Codeine Hives   Erythromycin    Hydrocodone-Acetaminophen Hives   Lansoprazole    Levofloxacin    Migraine Formula  [Aspirin-Acetaminophen-Caffeine]    Morphine Sulfate    Omeprazole    Pepcid  [Famotidine]    Sulfa Antibiotics     Outpatient Encounter Medications as of 03/01/2021  Medication Sig Note   acetaminophen (TYLENOL) 500 MG tablet Take 1,000 mg by mouth every 6 (six) hours as needed.     albuterol (VENTOLIN HFA) 108 (90 Base) MCG/ACT inhaler INHALE 2 PUFFS INTO THE LUNGS EVERY 4 HOURS AS NEEDED FOR WHEEZE OR FOR SHORTNESS OF BREATH    ALPRAZolam (XANAX) 1 MG tablet Take 1 tablet (1 mg total) by mouth 2 (two) times daily.    amphetamine-dextroamphetamine (ADDERALL) 30 MG tablet Take 1 tablet by mouth 2 (two) times daily.    ascorbic acid  (VITAMIN C) 500 MG tablet Take 1,000 mg by mouth daily.  09/08/2014: Received from: [place]: Take by mouth.   Aspirin-Caffeine (BC FAST PAIN RELIEF PO) Take by mouth as needed.    Blood Glucose Monitoring Suppl (ONETOUCH VERIO) w/Device KIT To check blood sugar daily for diabetes    cetirizine (ZYRTEC) 10 MG tablet Take 10 mg by mouth daily.    glucose blood (ONETOUCH VERIO) test strip To check blood sugar daily for diabetes    hydrochlorothiazide (HYDRODIURIL) 12.5 MG tablet TAKE 1 TABLET BY MOUTH EVERY DAY    ibuprofen (ADVIL,MOTRIN) 200 MG tablet Take 4 tablets by mouth as needed. 09/08/2014: Medication taken as needed.  Received from: [place]: Take by mouth.   Lancets (ONETOUCH ULTRASOFT) lancets To check blood sugar daily for diabetes    levothyroxine (SYNTHROID) 25 MCG tablet TAKE 1 TABLET BY MOUTH DAILY BEFORE BREAKFAST.    oxyCODONE-acetaminophen (ENDOCET) 7.5-325 MG tablet Take 1 tablet by mouth 2 (two) times daily.    pantoprazole (PROTONIX) 40 MG tablet TAKE 1 TABLET BY MOUTH EVERY DAY    predniSONE (DELTASONE) 20 MG tablet Take 1 tablet (20 mg total) by mouth daily with breakfast.    rosuvastatin (CRESTOR) 10 MG tablet Take 1 tablet (10 mg total) by mouth daily.    sodium chloride (OCEAN) 0.65 % SOLN nasal spray Place 1 spray into both nostrils as needed for congestion.    [DISCONTINUED] ALPRAZolam (XANAX) 1 MG tablet Take 1 tablet (1 mg total) by mouth 2 (  two) times daily.    [DISCONTINUED] amoxicillin-clavulanate (AUGMENTIN) 875-125 MG tablet Take 1 tablet by mouth 2 (two) times daily.    [DISCONTINUED] amphetamine-dextroamphetamine (ADDERALL) 30 MG tablet Take 1 tablet by mouth 2 (two) times daily.    [DISCONTINUED] oxyCODONE-acetaminophen (ENDOCET) 7.5-325 MG tablet Take 1 tablet by mouth 2 (two) times daily.    No facility-administered encounter medications on file as of 03/01/2021.    Patient Active Problem  List   Diagnosis Date Noted   Tobacco abuse 03/06/2021   Chronic cough 03/06/2021   Loud snoring 02/06/2021   Hypercholesterolemia 12/15/2020   Xanthelasma of eyelid 12/15/2020   Intractable migraine without aura and with status migrainosus 11/23/2020   Acute diffuse otitis externa of both ears 11/23/2020   Chronic pansinusitis 11/23/2020   Encounter for smoking cessation counseling 11/23/2020   Depression, major, single episode, severe ([place]) 06/01/2020   Menopausal syndrome (hot flashes) 08/13/2016   Tobacco dependence 08/13/2016   Family history of diabetes mellitus in mother 07/10/2016   Chronic low back pain 11/08/2014   Onychomycosis 09/15/2014   ADD (attention deficit disorder) 09/08/2014   Ankle pain 09/08/2014   Ache in joint 09/08/2014   Anxiety 09/08/2014   Airway hyperreactivity 09/08/2014   Ankle fracture 09/08/2014   Back ache 09/08/2014   Bursitis of hip 09/08/2014   Chronic anemia 09/08/2014   Chronic constipation 09/08/2014   CAFL (chronic airflow limitation) ([place]) 09/08/2014   Accumulation of fluid in tissues 09/08/2014   Biliary sludge 09/08/2014   Acid reflux 09/08/2014   History of migraine headaches 09/08/2014   Hypercholesteremia 09/08/2014   Adiposity 09/08/2014   Episodic paroxysmal anxiety disorder 09/08/2014   Scoliosis 09/08/2014   Tendinitis of wrist 09/08/2014   Big thyroid 09/08/2014   Current tobacco use 09/08/2014   Patient Care Plan: RN Care Management Plan of Care     Problem Identified: CAFL, HLD      Long-Range Goal: Disease Progression Prevented or Minimized   Start Date: 02/26/2021  Expected End Date: 05/27/2021  Priority: High  Note:   Current Barriers:  Chronic Disease Management support and education needs related to HLD and Chronic Airflow Limitation.  RNCM Clinical Goal(s):  Patient will demonstrate ongoing adherence to prescribed treatment plan for HLD and Chronic Airflow Limitation through collaboration with the  provider, RN Care manager and the care team.   Interventions: 1:1 collaboration with primary care provider regarding development and update of comprehensive plan of care as evidenced by provider attestation and co-signature Inter-disciplinary care team collaboration (see longitudinal plan of care) Evaluation of current treatment plan related to  self management and patient's adherence to plan as established by provider   Hyperlipidemia Interventions:   Lab Results  Component Value Date   CHOL 265 (H) 06/01/2020   HDL 38 (L) 06/01/2020   LDLCALC 191 (H) 06/01/2020   TRIG 190 (H) 06/01/2020   CHOLHDL 6.9 (H) 01/16/2018  Medications reviewed.  Reviewed provider established cholesterol goals. Discussed importance of completing regular laboratory monitoring as ordered. Reviewed role and benefits of statin for ASCVD risk reduction. Reviewed importance of limiting foods high in cholesterol. Discuss plan regarding Dermatology referral. She previously expressed concerns regarding treatment options for xanthelasma. Confirmed speaking with the Dermatology team and scheduling the initial evaluation. She will be evaluated in March.  Chronic Airflow Limitation: Reviewed information regarding self care/management/and exacerbation prevention. Reports recently being treated for a severe sinus infection. Reports completing prednisone and full course of antibiotics as prescribed. Denies episodes of shortness of  breath but still experiencing congestion. Reports using inhaler as prescribed. Thoroughly reviewed indications for seeking immediate medical attention. She is agreeable to a clinic follow up if the symptoms do not resolve. Discussed plan for ENT evaluation. Reports she was not able to schedule the initial visit. Will follow up with the ENT team to determine if a new referral is required.  Reviewed importance of daily self assessment.  Reviewed information related to respiratory infection prevention.  Advised to utilize  prevention strategies to reduce risk of respiratory infection  Reviewed risk relate to daily cigarette use. Currently not interested in counseling. Advised to consider plan for smoking cessation. Thoroughly reviewed worsening symptoms that require immediate medical attention.  Update on 03/01/21: Contacted the ENT team regarding the pending referral. Staff indicated the previous referral was closed d/t not being able to establish contact. Message relayed to PCP regarding need for a new referral. Discussed symptoms r/t suspected sinus infection. Patient reports unrelieved congestion and sinus pressure. She was recently treated for a sinus infection. Requesting to complete another course of antibiotics since symptoms have not resolved. Denies episodes of shortness of breath or need for urgent evaluation. Collaborated with clinic schedulers. Visit scheduled for 03/06/21. Thoroughly reviewed worsening symptoms that require immediate medical attention. Patient agreed to seek care in the Urgent Care or ER if her symptoms worsen prior to the visit with her PCP.  Assistance with Medications: [facility]. [facility]. Pharmacy is requesting a new order for Xanax, Endocet and Adderall. Message relayed to patient's primary care provider. Pharmacy staff agreed to contact Mrs. Serna once the new orders are received and the prescriptions are ready for pick up.      Patient Goals/Self-Care Activities: Take all medications as prescribed Attend all scheduled provider appointments Call pharmacy for medication refills 3-7 days in advance of running out of medications Perform all self care activities independently  Call provider office for new concerns or questions    Follow Up Plan:   Will follow up within the next month       PLAN: A member of the care management team will follow up within the  next month.   Cristy Friedlander [facility]N Care Management [facility] [phone]

## 2021-03-16 ENCOUNTER — Ambulatory Visit (INDEPENDENT_AMBULATORY_CARE_PROVIDER_SITE_OTHER): Payer: Medicare Other

## 2021-03-16 DIAGNOSIS — J449 Chronic obstructive pulmonary disease, unspecified: Secondary | ICD-10-CM

## 2021-03-16 DIAGNOSIS — E7800 Pure hypercholesterolemia, unspecified: Secondary | ICD-10-CM

## 2021-03-16 DIAGNOSIS — J324 Chronic pansinusitis: Secondary | ICD-10-CM

## 2021-03-16 NOTE — Chronic Care Management (AMB) (Signed)
Chronic Care Management   CCM RN Visit Note  03/16/2021 Name: Kathleen Buchanan MRN: [id number] DOB: Jan 24, 1966  Subjective: Kathleen Buchanan is a 56 y.o. year old female who is a primary care patient of [name], FNP. The care management team was consulted for assistance with disease management and care coordination needs.    Engaged with patient by telephone for follow up visit in response to provider referral for case management and care coordination services.   Consent to Services:  The patient was given information about Chronic Care Management services, agreed to services, and gave verbal consent prior to initiation of services.  Please see initial visit note for detailed documentation.   Assessment: Review of patient past medical history, allergies, medications, health status, including review of consultants reports, laboratory and other test data, was performed as part of comprehensive evaluation and provision of chronic care management services.   SDOH (Social Determinants of Health) assessments and interventions performed: No  CCM Care Plan  Allergies  Allergen Reactions   Macrobid [Nitrofurantoin Macrocrystal] Diarrhea   Codeine Hives   Erythromycin    Hydrocodone-Acetaminophen Hives   Lansoprazole    Levofloxacin    Migraine Formula  [Aspirin-Acetaminophen-Caffeine]    Morphine Sulfate    Omeprazole    Pepcid  [Famotidine]    Sulfa Antibiotics     Outpatient Encounter Medications as of 03/16/2021  Medication Sig Note   acetaminophen (TYLENOL) 500 MG tablet Take 1,000 mg by mouth every 6 (six) hours as needed.     albuterol (VENTOLIN HFA) 108 (90 Base) MCG/ACT inhaler INHALE 2 PUFFS INTO THE LUNGS EVERY 4 HOURS AS NEEDED FOR WHEEZE OR FOR SHORTNESS OF BREATH    ALPRAZolam (XANAX) 1 MG tablet Take 1 tablet (1 mg total) by mouth 2 (two) times daily.    amoxicillin-clavulanate (AUGMENTIN) 875-125 MG tablet Take 1 tablet by mouth 2 (two) times daily.     amphetamine-dextroamphetamine (ADDERALL) 30 MG tablet Take 1 tablet by mouth 2 (two) times daily.    ascorbic acid (VITAMIN C) 500 MG tablet Take 1,000 mg by mouth daily.  09/08/2014: Received from: [place]: Take by mouth.   Aspirin-Caffeine (BC FAST PAIN RELIEF PO) Take by mouth as needed.    Blood Glucose Monitoring Suppl (ONETOUCH VERIO) w/Device KIT To check blood sugar daily for diabetes    cetirizine (ZYRTEC) 10 MG tablet Take 10 mg by mouth daily.    glucose blood (ONETOUCH VERIO) test strip To check blood sugar daily for diabetes    hydrochlorothiazide (HYDRODIURIL) 12.5 MG tablet TAKE 1 TABLET BY MOUTH EVERY DAY    ibuprofen (ADVIL,MOTRIN) 200 MG tablet Take 4 tablets by mouth as needed. 09/08/2014: Medication taken as needed.  Received from: [place]: Take by mouth.   Lancets (ONETOUCH ULTRASOFT) lancets To check blood sugar daily for diabetes    levothyroxine (SYNTHROID) 25 MCG tablet TAKE 1 TABLET BY MOUTH DAILY BEFORE BREAKFAST.    oxyCODONE-acetaminophen (ENDOCET) 7.5-325 MG tablet Take 1 tablet by mouth 2 (two) times daily.    pantoprazole (PROTONIX) 40 MG tablet TAKE 1 TABLET BY MOUTH EVERY DAY    predniSONE (DELTASONE) 20 MG tablet Take 1 tablet (20 mg total) by mouth daily with breakfast.    rosuvastatin (CRESTOR) 10 MG tablet Take 1 tablet (10 mg total) by mouth daily.    sodium chloride (OCEAN) 0.65 % SOLN nasal spray Place 1 spray into both nostrils as needed for congestion.  No facility-administered encounter medications on file as of 03/16/2021.    Patient Active Problem List   Diagnosis Date Noted   Tobacco abuse 03/06/2021   Chronic cough 03/06/2021   Loud snoring 02/06/2021   Hypercholesterolemia 12/15/2020   Xanthelasma of eyelid 12/15/2020   Intractable migraine without aura and with status migrainosus 11/23/2020   Acute diffuse otitis externa of both ears 11/23/2020   Chronic pansinusitis  11/23/2020   Encounter for smoking cessation counseling 11/23/2020   Depression, major, single episode, severe ([place]) 06/01/2020   Menopausal syndrome (hot flashes) 08/13/2016   Tobacco dependence 08/13/2016   Family history of diabetes mellitus in mother 07/10/2016   Chronic low back pain 11/08/2014   Onychomycosis 09/15/2014   ADD (attention deficit disorder) 09/08/2014   Ankle pain 09/08/2014   Ache in joint 09/08/2014   Anxiety 09/08/2014   Airway hyperreactivity 09/08/2014   Ankle fracture 09/08/2014   Back ache 09/08/2014   Bursitis of hip 09/08/2014   Chronic anemia 09/08/2014   Chronic constipation 09/08/2014   CAFL (chronic airflow limitation) ([place]) 09/08/2014   Accumulation of fluid in tissues 09/08/2014   Biliary sludge 09/08/2014   Acid reflux 09/08/2014   History of migraine headaches 09/08/2014   Hypercholesteremia 09/08/2014   Adiposity 09/08/2014   Episodic paroxysmal anxiety disorder 09/08/2014   Scoliosis 09/08/2014   Tendinitis of wrist 09/08/2014   Big thyroid 09/08/2014   Current tobacco use 09/08/2014    Patient Care Plan: RN Care Management Plan of Care     Problem Identified: CAFL, HLD      Long-Range Goal: Disease Progression Prevented or Minimized   Start Date: 02/26/2021  Expected End Date: 05/27/2021  Priority: High  Note:   Current Barriers:  Chronic Disease Management support and education needs related to HLD and Chronic Airflow Limitation.  RNCM Clinical Goal(s):  Patient will demonstrate ongoing adherence to prescribed treatment plan for HLD and Chronic Airflow Limitation through collaboration with the provider, RN Care manager and the care team.   Interventions: 1:1 collaboration with primary care provider regarding development and update of comprehensive plan of care as evidenced by provider attestation and co-signature Inter-disciplinary care team collaboration (see longitudinal plan of care) Evaluation of current treatment plan  related to  self management and patient's adherence to plan as established by provider   Hyperlipidemia Interventions:   Lab Results  Component Value Date   CHOL 265 (H) 06/01/2020   HDL 38 (L) 06/01/2020   LDLCALC 191 (H) 06/01/2020   TRIG 190 (H) 06/01/2020   CHOLHDL 6.9 (H) 01/16/2018  Medications reviewed.  Reviewed provider established cholesterol goals. Discussed importance of completing regular laboratory monitoring as ordered. Reviewed role and benefits of statin for ASCVD risk reduction. Reviewed importance of limiting foods high in cholesterol. Discuss plan regarding Dermatology referral. She previously expressed concerns regarding treatment options for xanthelasma. Confirmed speaking with the Dermatology team and scheduling the initial evaluation. She will be evaluated in March.  Chronic Airflow Limitation: Reviewed information regarding self care/management/and exacerbation prevention. She is recovering from a severe sinus infections. Completes virtual outreach with her PCP on 03/06/21 as scheduled. Reports taking all medications as prescribed. She is aware of need to complete full course of antibiotics. Discussed current symptoms. Reports symptoms seem to be improving. She continues to experience mild sinus congestion and an occasional cough. Reports the provider also advised that she take sudafed for congestion if needed.  Reinforced importance of daily self assessment. Thoroughly reviewed s/sx of severe respiratory infection along  with indications for seeking immediate medical attention.   Patient Goals/Self-Care Activities: Take all medications as prescribed Attend all scheduled provider appointments Call pharmacy for medication refills 3-7 days in advance of running out of medications Perform all self care activities independently  Call provider office for new concerns or questions    Follow Up Plan:   Will follow up within the next month       PLAN: A member  of the care management team will follow up within the next month.   Cristy Friedlander [facility]N Care Management [facility] [phone]

## 2021-04-02 ENCOUNTER — Other Ambulatory Visit: Payer: Self-pay | Admitting: Family Medicine

## 2021-04-02 DIAGNOSIS — F419 Anxiety disorder, unspecified: Secondary | ICD-10-CM

## 2021-04-02 DIAGNOSIS — M778 Other enthesopathies, not elsewhere classified: Secondary | ICD-10-CM

## 2021-04-02 DIAGNOSIS — F900 Attention-deficit hyperactivity disorder, predominantly inattentive type: Secondary | ICD-10-CM

## 2021-04-02 NOTE — Telephone Encounter (Signed)
Medication Refill - Medication: Adderall , Oxycodone and her xanax  Has the patient contacted their pharmacy? No  she said they tell her to call the office for these. (Agent: If no, request that the patient contact the pharmacy for the refill. If patient does not wish to contact the pharmacy document the reason why and proceed with request.) (Agent: If yes, when and what did the pharmacy advise?)  Preferred Pharmacy (with phone number or street name): [facility] [place] Has the patient been seen for an appointment in the last year OR does the patient have an upcoming appointment? Yes.   y Agent: Please be advised that RX refills may take up to 3 business days. We ask that you follow-up with your pharmacy.

## 2021-04-02 NOTE — Telephone Encounter (Signed)
Requested medications are due for refill today.  yes  Requested medications are on the active medications list.  yes  Last refill. 03/01/2021 for all 3  Future visit scheduled.   yes  Notes to clinic.  Medications are not delegated.    Requested Prescriptions  Pending Prescriptions Disp Refills   ALPRAZolam (XANAX) 1 MG tablet 60 tablet 0    Sig: Take 1 tablet (1 mg total) by mouth 2 (two) times daily.     Not Delegated - Psychiatry:  Anxiolytics/Hypnotics Failed - 04/02/2021  5:56 PM      Failed - This refill cannot be delegated      Failed - Urine Drug Screen completed in last 360 days      Passed - Valid encounter within last 6 months    Recent Outpatient Visits           3 weeks ago Chronic pansinusitis   [facility] [name], FNP   1 month ago Chronic pansinusitis   [facility] Tally Joe T, FNP   4 months ago Intractable migraine without aura and with status migrainosus   [facility] [name], FNP   10 months ago Annual physical exam   [facility], Clearnce Sorrel, [place]   1 year ago Acute recurrent pansinusitis   [place], Clearnce Sorrel, [place]       Future Appointments             In 1 month Ralene Bathe, MD [place]   In 2 months [name], [place], [facility]             amphetamine-dextroamphetamine (ADDERALL) 30 MG tablet 60 tablet 0    Sig: Take 1 tablet by mouth 2 (two) times daily.     Not Delegated - Psychiatry:  Stimulants/ADHD Failed - 04/02/2021  5:56 PM      Failed - This refill cannot be delegated      Failed - Urine Drug Screen completed in last 360 days      Passed - Valid encounter within last 3 months    Recent Outpatient Visits           3 weeks ago Chronic pansinusitis   [facility] [name], FNP   1 month ago Chronic pansinusitis   [facility] Tally Joe T, FNP   4 months ago Intractable migraine without aura and with status migrainosus   [facility] [name], FNP   10 months ago Annual physical exam   [facility], Clearnce Sorrel, [place]   1 year ago Acute recurrent pansinusitis   [facility] [place], Clearnce Sorrel, [place]       Future Appointments             In 1 month Ralene Bathe, MD [place]   In 2 months [name], [place], [facility]             oxyCODONE-acetaminophen (ENDOCET) 7.5-325 MG tablet 60 tablet 0    Sig: Take 1 tablet by mouth 2 (two) times daily.     Not Delegated - Analgesics:  Opioid Agonist Combinations Failed - 04/02/2021  5:56 PM      Failed - This refill cannot be delegated      Failed - Urine Drug Screen completed in last 360 days      Passed -  Valid encounter within last 6 months    Recent Outpatient Visits           3 weeks ago Chronic pansinusitis   [facility] [name], FNP   1 month ago Chronic pansinusitis   [facility] Tally Joe T, FNP   4 months ago Intractable migraine without aura and with status migrainosus   [facility] [name], FNP   10 months ago Annual physical exam   [place], Clearnce Sorrel, [place]   1 year ago Acute recurrent pansinusitis   [facility] [place], Clearnce Sorrel, [place]       Future Appointments             In 1 month Ralene Bathe, MD [place]   In 2 months [name], [place], [place]

## 2021-04-03 NOTE — Telephone Encounter (Signed)
LOV: 11/23/2020  NOV: 06/01/2021   Thanks,   -Mickel Baas

## 2021-04-04 ENCOUNTER — Other Ambulatory Visit: Payer: Self-pay | Admitting: Family Medicine

## 2021-04-04 ENCOUNTER — Other Ambulatory Visit: Payer: Self-pay | Admitting: Physician Assistant

## 2021-04-04 DIAGNOSIS — E034 Atrophy of thyroid (acquired): Secondary | ICD-10-CM

## 2021-04-04 DIAGNOSIS — G43011 Migraine without aura, intractable, with status migrainosus: Secondary | ICD-10-CM

## 2021-04-04 DIAGNOSIS — E7800 Pure hypercholesterolemia, unspecified: Secondary | ICD-10-CM

## 2021-04-04 MED ORDER — AMPHETAMINE-DEXTROAMPHETAMINE 30 MG PO TABS: 1.0000 | ORAL_TABLET | Freq: Two times a day (BID) | ORAL | 0 refills | Status: DC

## 2021-04-04 MED ORDER — OXYCODONE-ACETAMINOPHEN 7.5-325 MG PO TABS: 1.0000 | ORAL_TABLET | Freq: Two times a day (BID) | ORAL | 0 refills | Status: DC

## 2021-04-04 MED ORDER — ALPRAZOLAM 1 MG PO TABS: 1.0000 mg | ORAL_TABLET | Freq: Two times a day (BID) | ORAL | 0 refills | Status: DC

## 2021-04-10 DIAGNOSIS — J449 Chronic obstructive pulmonary disease, unspecified: Secondary | ICD-10-CM

## 2021-04-10 DIAGNOSIS — E7800 Pure hypercholesterolemia, unspecified: Secondary | ICD-10-CM

## 2021-04-16 DIAGNOSIS — T7840XA Allergy, unspecified, initial encounter: Secondary | ICD-10-CM | POA: Diagnosis not present

## 2021-04-16 DIAGNOSIS — J329 Chronic sinusitis, unspecified: Secondary | ICD-10-CM | POA: Diagnosis not present

## 2021-04-16 DIAGNOSIS — J310 Chronic rhinitis: Secondary | ICD-10-CM | POA: Diagnosis not present

## 2021-04-27 ENCOUNTER — Ambulatory Visit: Payer: Medicare Other

## 2021-05-03 ENCOUNTER — Other Ambulatory Visit: Payer: Self-pay | Admitting: Family Medicine

## 2021-05-03 DIAGNOSIS — M778 Other enthesopathies, not elsewhere classified: Secondary | ICD-10-CM

## 2021-05-03 DIAGNOSIS — F419 Anxiety disorder, unspecified: Secondary | ICD-10-CM

## 2021-05-03 DIAGNOSIS — F900 Attention-deficit hyperactivity disorder, predominantly inattentive type: Secondary | ICD-10-CM

## 2021-05-03 MED ORDER — OXYCODONE-ACETAMINOPHEN 7.5-325 MG PO TABS: 1.0000 | ORAL_TABLET | Freq: Two times a day (BID) | ORAL | 0 refills | Status: DC

## 2021-05-03 MED ORDER — AMPHETAMINE-DEXTROAMPHETAMINE 30 MG PO TABS: 1.0000 | ORAL_TABLET | Freq: Two times a day (BID) | ORAL | 0 refills | Status: DC

## 2021-05-03 MED ORDER — ALPRAZOLAM 1 MG PO TABS: 1.0000 mg | ORAL_TABLET | Freq: Two times a day (BID) | ORAL | 0 refills | Status: DC

## 2021-05-03 NOTE — Telephone Encounter (Signed)
Requested medications are due for refill today.  Yes - all 3  Requested medications are on the active medications list.  Yes - all 3  Last refill. All 3  - 04/04/2021 #60 0 refills  Future visit scheduled.   yes  Notes to clinic.  Medications are not delegated.    Requested Prescriptions  Pending Prescriptions Disp Refills   ALPRAZolam (XANAX) 1 MG tablet 60 tablet 0    Sig: Take 1 tablet (1 mg total) by mouth 2 (two) times daily.     Not Delegated - Psychiatry: Anxiolytics/Hypnotics 2 Failed - 05/03/2021  2:26 PM      Failed - This refill cannot be delegated      Failed - Urine Drug Screen completed in last 360 days      Passed - Patient is not pregnant      Passed - Valid encounter within last 6 months    Recent Outpatient Visits           1 month ago Chronic pansinusitis   [facility] Tally Joe T, FNP   2 months ago Chronic pansinusitis   [facility] Tally Joe T, FNP   5 months ago Intractable migraine without aura and with status migrainosus   [facility] [name], FNP   11 months ago Annual physical exam   [facility], Clearnce Sorrel, [place]   1 year ago Acute recurrent pansinusitis   [facility] [place], Clearnce Sorrel, [place]       Future Appointments             In 4 weeks Ralene Bathe, MD [place]   In 4 weeks [name], [place], [facility]             amphetamine-dextroamphetamine (ADDERALL) 30 MG tablet 60 tablet 0    Sig: Take 1 tablet by mouth 2 (two) times daily.     Not Delegated - Psychiatry:  Stimulants/ADHD Failed - 05/03/2021  2:26 PM      Failed - This refill cannot be delegated      Failed - Urine Drug Screen completed in last 360 days      Passed - Last BP in normal range    BP Readings from Last 1 Encounters:  11/23/20 110/64          Passed - Last Heart Rate in normal range    Pulse Readings from Last 1  Encounters:  11/23/20 [place] encounter within last 6 months    Recent Outpatient Visits           1 month ago Chronic pansinusitis   [facility] [name], FNP   2 months ago Chronic pansinusitis   [facility] Tally Joe T, FNP   5 months ago Intractable migraine without aura and with status migrainosus   [facility] [name], FNP   11 months ago Annual physical exam   [place], Clearnce Sorrel, [place]   1 year ago Acute recurrent pansinusitis   [facility] [place], Clearnce Sorrel, [place]       Future Appointments             In 4 weeks Ralene Bathe, MD [place]   In 4 weeks [name], [place], [place]  oxyCODONE-acetaminophen (ENDOCET) 7.5-325 MG tablet 60 tablet 0    Sig: Take 1 tablet by mouth 2 (two) times daily.     Not Delegated - Analgesics:  Opioid Agonist Combinations Failed - 05/03/2021  2:26 PM      Failed - This refill cannot be delegated      Failed - Urine Drug Screen completed in last 360 days      Passed - Valid encounter within last 3 months    Recent Outpatient Visits           1 month ago Chronic pansinusitis   [facility] [name], FNP   2 months ago Chronic pansinusitis   [facility] Tally Joe T, FNP   5 months ago Intractable migraine without aura and with status migrainosus   [facility] [name], FNP   11 months ago Annual physical exam   [place], Clearnce Sorrel, [place]   1 year ago Acute recurrent pansinusitis   [facility] [place], Clearnce Sorrel, [place]       Future Appointments             In 4 weeks Ralene Bathe, MD [place]   In 4 weeks [name], [place], [place]

## 2021-05-03 NOTE — Telephone Encounter (Signed)
Medication Refill - Medication:   amphetamine-dextroamphetamine (ADDERALL) 30 MG tablet   oxyCODONE-acetaminophen (ENDOCET) 7.5-325 MG tablet   ALPRAZolam (XANAX) 1 MG tablet  Has the patient contacted their pharmacy? No. All control substances, pt prefers to contact office directly for these medications.  (Agent: If no, request that the patient contact the pharmacy for the refill. If patient does not wish to contact the pharmacy document the reason why and proceed with request.) (Agent: If yes, when and what did the pharmacy advise?)  Preferred Pharmacy (with phone number or street name):   [id number] - [place], [address]  [place] [address]  Phone: [phone] Fax: [phone]     Has the patient been seen for an appointment in the last year OR does the patient have an upcoming appointment? Yes.    Agent: Please be advised that RX refills may take up to 3 business days. We ask that you follow-up with your pharmacy.

## 2021-05-11 ENCOUNTER — Telehealth: Payer: Self-pay

## 2021-05-11 NOTE — Telephone Encounter (Signed)
?  Care Management  ? ?Follow Up Note ? ? ?05/11/2021 ?Name: Kathleen Buchanan MRN: [id number] DOB: 04-13-1965 ? ? ?Primary Care Provider: [name], FNP ?Reason for referral : Chronic Care Management ? ? ?An unsuccessful telephone outreach was attempted today. The patient was referred to the case management team for assistance with care management and care coordination.  ? ?Follow Up Plan:  ?A HIPAA compliant voice message was left today requesting a return call. ? ? ?Laquana Villari,RN ?[place] Care Management ?[place] Provider ?[phone] ? ? ?

## 2021-05-31 ENCOUNTER — Ambulatory Visit (INDEPENDENT_AMBULATORY_CARE_PROVIDER_SITE_OTHER): Payer: Medicare Other | Admitting: Dermatology

## 2021-05-31 ENCOUNTER — Other Ambulatory Visit: Payer: Self-pay

## 2021-05-31 DIAGNOSIS — D225 Melanocytic nevi of trunk: Secondary | ICD-10-CM

## 2021-05-31 DIAGNOSIS — H0264 Xanthelasma of left upper eyelid: Secondary | ICD-10-CM

## 2021-05-31 DIAGNOSIS — D2261 Melanocytic nevi of right upper limb, including shoulder: Secondary | ICD-10-CM

## 2021-05-31 DIAGNOSIS — H0260 Xanthelasma of unspecified eye, unspecified eyelid: Secondary | ICD-10-CM

## 2021-05-31 DIAGNOSIS — D492 Neoplasm of unspecified behavior of bone, soft tissue, and skin: Secondary | ICD-10-CM

## 2021-05-31 DIAGNOSIS — H0265 Xanthelasma of left lower eyelid: Secondary | ICD-10-CM | POA: Diagnosis not present

## 2021-05-31 DIAGNOSIS — H0261 Xanthelasma of right upper eyelid: Secondary | ICD-10-CM

## 2021-05-31 DIAGNOSIS — H0262 Xanthelasma of right lower eyelid: Secondary | ICD-10-CM | POA: Diagnosis not present

## 2021-05-31 DIAGNOSIS — L988 Other specified disorders of the skin and subcutaneous tissue: Secondary | ICD-10-CM | POA: Diagnosis not present

## 2021-05-31 NOTE — Patient Instructions (Addendum)
If you have not heard from [place] by next Thursday, call the office [phone] option 4 and leave a message for Collan Schoenfeld to check on follow up appointment. ? ? ? ? ? ?Wound Care Instructions ? ?Cleanse wound gently with soap and water once a day then pat dry with clean gauze. Apply a thing coat of Petrolatum (petroleum jelly, "Vaseline") over the wound (unless you have an allergy to this). We recommend that you use a new, sterile tube of Vaseline. Do not pick or remove scabs. Do not remove the yellow or white "healing tissue" from the base of the wound. ? ?Cover the wound with fresh, clean, nonstick gauze and secure with paper tape. You may use Band-Aids in place of gauze and tape if the would is small enough, but would recommend trimming much of the tape off as there is often too much. Sometimes Band-Aids can irritate the skin. ? ?You should call the office for your biopsy report after 1 week if you have not already been contacted. ? ?If you experience any problems, such as abnormal amounts of bleeding, swelling, significant bruising, significant pain, or evidence of infection, please call the office immediately. ? ?FOR ADULT SURGERY PATIENTS: If you need something for pain relief you may take 1 extra strength Tylenol (acetaminophen) AND 2 Ibuprofen ('200mg'$  each) together every 4 hours as needed for pain. (do not take these if you are allergic to them or if you have a reason you should not take them.) Typically, you may only need pain medication for 1 to 3 days.  ? ? ? ? ?If You Need Anything After Your Visit ? ?If you have any questions or concerns for your doctor, please call our main line at [phone] and press option 4 to reach your doctor's medical assistant. If no one answers, please leave a voicemail as directed and we will return your call as soon as possible. Messages left after 4 pm will be answered the following business day.  ? ?You may also send [place] a message via MyChart. We typically respond to MyChart  messages within 1-2 business days. ? ?For prescription refills, please ask your pharmacy to contact our office. Our fax number is [phone]. ? ?If you have an urgent issue when the clinic is closed that cannot wait until the next business day, you can page your doctor at the number below.   ? ?Please note that while we do our best to be available for urgent issues outside of office hours, we are not available 24/7.  ? ?If you have an urgent issue and are unable to reach [place], you may choose to seek medical care at your doctor's office, retail clinic, urgent care center, or emergency room. ? ?If you have a medical emergency, please immediately call 911 or go to the emergency department. ? ?Pager Numbers ? ?- Dr. Nehemiah Massed: [phone] ? ?- Dr. Laurence Ferrari: [phone] ? ?- Dr. Nicole Kindred: [phone] ? ?In the event of inclement weather, please call our main line at [phone] for an update on the status of any delays or closures. ? ?Dermatology Medication Tips: ?Please keep the boxes that topical medications come in in order to help keep track of the instructions about where and how to use these. Pharmacies typically print the medication instructions only on the boxes and not directly on the medication tubes.  ? ?If your medication is too expensive, please contact our office at [phone] option 4 or send [place] a message through [place].  ? ?We are unable  to tell what your co-pay for medications will be in advance as this is different depending on your insurance coverage. However, we may be able to find a substitute medication at lower cost or fill out paperwork to get insurance to cover a needed medication.  ? ?If a prior authorization is required to get your medication covered by your insurance company, please allow [place] 1-2 business days to complete this process. ? ?Drug prices often vary depending on where the prescription is filled and some pharmacies may offer cheaper prices. ? ?The website www.goodrx.com contains  coupons for medications through different pharmacies. The prices here do not account for what the cost may be with help from insurance (it may be cheaper with your insurance), but the website can give you the price if you did not use any insurance.  ?- You can print the associated coupon and take it with your prescription to the pharmacy.  ?- You may also stop by our office during regular business hours and pick up a GoodRx coupon card.  ?- If you need your prescription sent electronically to a different pharmacy, notify our office through [facility] or by phone at [phone] option 4. ? ? ? ? ?Si Usted Necesita Algo Despu?s de Su Visita ? ?Tambi?n puede enviarnos un mensaje a trav?s de MyChart. Por lo general respondemos a los mensajes de MyChart en el transcurso de 1 a 2 d?as h?biles. ? ?Para renovar recetas, por favor pida a su farmacia que se ponga en contacto con nuestra oficina. Nuestro n?mero de fax es el [phone]. ? ?Si tiene un asunto urgente cuando la cl?nica est? cerrada y que no puede esperar hasta el siguiente d?a h?bil, puede llamar/localizar a su doctor(a) al n?mero que aparece a continuaci?n.  ? ?Por favor, tenga en cuenta que aunque hacemos todo lo posible para estar disponibles para asuntos urgentes fuera del horario de oficina, no estamos disponibles las 24 horas del d?a, los 7 d?as de la semana.  ? ?Si tiene un problema urgente y no puede comunicarse con nosotros, puede optar por buscar atenci?n m?dica  en el consultorio de su doctor(a), en una cl?nica privada, en un centro de atenci?n urgente o en una sala de emergencias. ? ?Si tiene Engineer, maintenance (IT) m?dica, por favor llame inmediatamente al 911 o vaya a la sala de emergencias. ? ?N?meros de b?per ? ?- Dr. Nehemiah Massed: [phone] ? ?- Dra. Moye: [phone] ? ?- Dra. Nicole Kindred: [phone] ? ?En caso de inclemencias del tiempo, por favor llame a nuestra l?nea principal al [phone] para una actualizaci?n sobre el estado de  cualquier retraso o cierre. ? ?Consejos para la medicaci?n en dermatolog?a: ?Por favor, guarde las cajas en las que vienen los medicamentos de uso t?pico para ayudarle a seguir las instrucciones sobre d?nde y c?mo usarlos. Las farmacias generalmente imprimen las instrucciones del medicamento s?lo en las cajas y no directamente en los tubos del [place].  ? ?Si su medicamento es muy caro, por favor, p?ngase en contacto con Zigmund Daniel llamando al [phone] y presione la opci?n 4 o env?enos un mensaje a trav?s de MyChart.  ? ?No podemos decirle cu?l ser? su copago por los medicamentos por adelantado ya que esto es diferente dependiendo de la cobertura de su seguro. Sin embargo, es posible que podamos encontrar un medicamento sustituto a Electrical engineer un formulario para que el seguro cubra el medicamento que se considera necesario.  ? ?Si se requiere Ardelia Mems autorizaci?n previa para que su compa??a de seguros  cubra su medicamento, por favor perm?tanos de 1 a 2 d?as h?biles para completar este proceso. ? ?Los precios de los medicamentos var?an con frecuencia dependiendo del Environmental consultant de d?nde se surte la receta y alguna farmacias pueden ofrecer precios m?s baratos. ? ?El sitio web www.goodrx.com tiene cupones para medicamentos de Airline pilot. Los precios aqu? no tienen en cuenta lo que podr?a costar con la ayuda del seguro (puede ser m?s barato con su seguro), pero el sitio web puede darle el precio si no utiliz? ning?n seguro.  ?- Puede imprimir el cup?n correspondiente y llevarlo con su receta a la farmacia.  ?- Tambi?n puede pasar por nuestra oficina durante el horario de atenci?n regular y recoger una tarjeta de cupones de GoodRx.  ?- Si necesita que su receta se env?e electr?nicamente a Chiropodist, informe a nuestra oficina a trav?s de MyChart de [place] o por tel?fono llamando al [phone] y presione la opci?n 4.  ?

## 2021-05-31 NOTE — Progress Notes (Signed)
? ?New Patient Visit ? ?Subjective  ?Kathleen Buchanan is a 56 y.o. female who presents for the following: growths (Upper eyelids, >1 yr, ) and Nevus (R ant shoulder, irritated/Back, irritated). ?The patient has spots, moles and lesions to be evaluated, some may be new or changing and the patient has concerns that these could be cancer. ? ?New patient referral from Tally Joe, [place]. ? ?Pt accompanied by husband. ? ?The following portions of the chart were reviewed this encounter and updated as appropriate:  ? Tobacco  Allergies  Meds  Problems  Med Hx  Surg Hx  Fam Hx   ?  ?Review of Systems:  No other skin or systemic complaints except as noted in HPI or Assessment and Plan. ? ?Objective  ?Well appearing patient in no apparent distress; mood and affect are within normal limits. ? ?A focused examination was performed including face. Relevant physical exam findings are noted in the Assessment and Plan. ? ?bil upper eyelids ?R upper med eyelid yellow flat 0.6cm pap ?L upper med eyelid yellow flat 0.9 x 0.6cm pap ?Small yellow flat papules lower eyelids ? ? ? ? ? ? ? ? ? ? ? ? ?bil upper eyelids ?Excess skin upper eyelids ? ?Right Shoulder - Anterior ?0.6 flesh paps ? ?Mid back R paraspinal ?0.6cm flesh paps ? ?R low back 3.0cm lat to midline ?0.6cm flesh paps ? ?Left Lower Back 14cm lat to midline ?0.6cm flesh paps ? ? ?Assessment & Plan  ?Xanthelasma (with associated dermatochalasis) ?bil upper eyelids and bilateral lower eyelids ?See photos ?Discussed treatment option of destruction versus excision. ?Because the patient has dermatochalasis,, discussed that she should consider evaluation with visual field testing.  She may be candidate for blepharoplasty during which time xanthelasma can be removed with blepharoplasty procedure. ?I discussed this patient with [facility] Mali [facility] at [facility], and he will have his assistant contact the patient the week of 06/04/2021 to arrange evaluation and possibly  visual field testing.  He will also arrange for the patient to see an oculoplastic surgeon if indicated. ? ?Elastosis of skin ?bil upper eyelids ?See photos ?Discussed Blepharoplasty, recommend evaluation with Dr. Gildardo Cranker or Dr. Heron Nay at [facility] or Dr. Yvetta Coder at [facility] ? ?Will plan referral after discussing with [facility] -discussion took place above with Dr. Mali Brazelton.  See above. ? ?Neoplasm of skin (4) ?Right Shoulder - Anterior ?Epidermal / dermal shaving ? ?Lesion diameter (cm):  0.6 ?Informed consent: discussed and consent obtained   ?Timeout: patient name, date of birth, surgical site, and procedure verified   ?Procedure prep:  Patient was prepped and draped in usual sterile fashion ?Prep type:  Isopropyl alcohol ?Anesthesia: the lesion was anesthetized in a standard fashion   ?Anesthetic:  1% lidocaine w/ epinephrine 1-100,000 buffered w/ 8.4% NaHCO3 ?Instrument used: flexible razor blade   ?Hemostasis achieved with: pressure, aluminum chloride and electrodesiccation   ?Outcome: patient tolerated procedure well   ?Post-procedure details: sterile dressing applied and wound care instructions given   ?Dressing type: bandage and bacitracin   ? ?Specimen 1 - Surgical pathology ?Differential Diagnosis: D48.5 Irritated Nevus r/o Atypia ?Check Margins: yes ?0.6 flesh paps ? ?Mid back R paraspinal ?Epidermal / dermal shaving ? ?Lesion diameter (cm):  0.6 ?Informed consent: discussed and consent obtained   ?Timeout: patient name, date of birth, surgical site, and procedure verified   ?Procedure prep:  Patient was prepped and draped in usual sterile fashion ?Prep type:  Isopropyl alcohol ?Anesthesia: the lesion was anesthetized in a standard fashion   ?Anesthetic:  1% lidocaine w/ epinephrine 1-100,000 buffered w/ 8.4% NaHCO3 ?Instrument used: flexible razor blade   ?Hemostasis achieved with: pressure, aluminum chloride and electrodesiccation   ?Outcome: patient tolerated  procedure well   ?Post-procedure details: sterile dressing applied and wound care instructions given   ?Dressing type: bandage and bacitracin   ? ?Specimen 2 - Surgical pathology ?Differential Diagnosis: D48.5 Irritated Nevus r/o Atypia ?Check Margins: yes ?0.6cm flesh paps ? ?R low back 3.0cm lat to midline ?Epidermal / dermal shaving ? ?Lesion diameter (cm):  0.6 ?Informed consent: discussed and consent obtained   ?Timeout: patient name, date of birth, surgical site, and procedure verified   ?Procedure prep:  Patient was prepped and draped in usual sterile fashion ?Prep type:  Isopropyl alcohol ?Anesthesia: the lesion was anesthetized in a standard fashion   ?Anesthetic:  1% lidocaine w/ epinephrine 1-100,000 buffered w/ 8.4% NaHCO3 ?Instrument used: flexible razor blade   ?Hemostasis achieved with: pressure, aluminum chloride and electrodesiccation   ?Outcome: patient tolerated procedure well   ?Post-procedure details: sterile dressing applied and wound care instructions given   ?Dressing type: bandage and bacitracin   ? ?Specimen 3 - Surgical pathology ?Differential Diagnosis: D48.5 Irritated Nevus r/o Atypia ?Check Margins: yes ?0.6cm flesh paps ? ?Left Lower Back 14cm lat to midline ?Epidermal / dermal shaving ? ?Lesion diameter (cm):  0.6 ?Informed consent: discussed and consent obtained   ?Timeout: patient name, date of birth, surgical site, and procedure verified   ?Procedure prep:  Patient was prepped and draped in usual sterile fashion ?Prep type:  Isopropyl alcohol ?Anesthesia: the lesion was anesthetized in a standard fashion   ?Anesthetic:  1% lidocaine w/ epinephrine 1-100,000 buffered w/ 8.4% NaHCO3 ?Instrument used: flexible razor blade   ?Hemostasis achieved with: pressure, aluminum chloride and electrodesiccation   ?Outcome: patient tolerated procedure well   ?Post-procedure details: sterile dressing applied and wound care instructions given   ?Dressing type: bandage and bacitracin   ? ?Specimen 4  - Surgical pathology ?Differential Diagnosis: D48.5 Irritated Nevus r/o Atypia ?Check Margins: yes ?0.6cm flesh paps ? ?Return if symptoms worsen or fail to improve. ? ?I, Othelia Pulling, RMA, am acting as scribe for Sarina Ser, MD . ?Documentation: I have reviewed the above documentation for accuracy and completeness, and I agree with the above. ? ?Sarina Ser, MD ? ?

## 2021-05-31 NOTE — Progress Notes (Signed)
?  ? ?[employer] as a Education administrator for [name], FNP.,have documented all relevant documentation on the behalf of [name], FNP,as directed by  [name], FNP while in the presence of [name], FNP.  ? ?Established patient visit ? ? ?Patient: Kathleen Buchanan   DOB: 1965-09-22   56 y.o. Female  MRN: [id number] ?Visit Date: 06/01/2021 ? ?Today's healthcare provider: [name], FNP  ?Re Introduced to nurse practitioner role and practice setting.  All questions answered.  Discussed provider/patient relationship and expectations. ? ? ?Chief Complaint  ?Patient presents with  ? Follow-up  ? Ear Pain  ? ?Subjective  ?  ?Otalgia  ?There is pain in the left ear. This is a new problem. The current episode started in the past 7 days. The problem has been unchanged. There has been no fever. Pertinent negatives include no abdominal pain, coughing, diarrhea, ear discharge, headaches, hearing loss, neck pain, rash, rhinorrhea, sore throat or vomiting. She has tried nothing for the symptoms.   ?Follow up for Intractable migraine without aura and with status migrainosus ? ?The patient was last seen for this 6 months ago. ?Changes made at last visit include Trial of daily medication to prevent ?Has decreased BC use- previous 150/month; continue to recommend decrease use of cessation given concern for rebound issues. ?Advised smoking cessation  ?Advised small, frequent meals. ?Continue Propranolol 20 mg ? ?She reports good compliance with treatment. ?She feels that condition is Unchanged. ?She is not having side effects.  ? ?-----------------------------------------------------------------------------------------  ? ?Medications: ?Outpatient Medications Prior to Visit  ?Medication Sig  ? acetaminophen (TYLENOL) 500 MG tablet Take 1,000 mg by mouth every 6 (six) hours as needed.   ? albuterol (VENTOLIN HFA) 108 (90 Base) MCG/ACT inhaler INHALE 2 PUFFS INTO THE LUNGS EVERY 4 HOURS AS NEEDED FOR WHEEZE  OR FOR SHORTNESS OF BREATH  ? ALPRAZolam (XANAX) 1 MG tablet Take 1 tablet (1 mg total) by mouth 2 (two) times daily.  ? amphetamine-dextroamphetamine (ADDERALL) 30 MG tablet Take 1 tablet by mouth 2 (two) times daily.  ? ascorbic acid (VITAMIN C) 500 MG tablet Take 1,000 mg by mouth daily.   ? Aspirin-Caffeine (BC FAST PAIN RELIEF PO) Take by mouth as needed.  ? Blood Glucose Monitoring Suppl (ONETOUCH VERIO) w/Device KIT To check blood sugar daily for diabetes  ? EPINEPHrine 0.3 mg/0.3 mL IJ SOAJ injection Inject into the muscle as needed.  ? glucose blood (ONETOUCH VERIO) test strip To check blood sugar daily for diabetes  ? hydrochlorothiazide (HYDRODIURIL) 12.5 MG tablet TAKE 1 TABLET BY MOUTH EVERY DAY  ? ibuprofen (ADVIL,MOTRIN) 200 MG tablet Take 4 tablets by mouth as needed.  ? Lancets (ONETOUCH ULTRASOFT) lancets To check blood sugar daily for diabetes  ? oxyCODONE-acetaminophen (ENDOCET) 7.5-325 MG tablet Take 1 tablet by mouth 2 (two) times daily.  ? pantoprazole (PROTONIX) 40 MG tablet TAKE 1 TABLET BY MOUTH EVERY DAY  ? predniSONE (DELTASONE) 20 MG tablet Take 1 tablet (20 mg total) by mouth daily with breakfast.  ? propranolol (INDERAL) 20 MG tablet TAKE 1 TABLET BY MOUTH TWICE A DAY  ? rosuvastatin (CRESTOR) 10 MG tablet TAKE 1 TABLET BY MOUTH EVERY DAY  ? sodium chloride (OCEAN) 0.65 % SOLN nasal spray Place 1 spray into both nostrils as needed for congestion.  ? [DISCONTINUED] amoxicillin-clavulanate (AUGMENTIN) 875-125 MG tablet Take 1 tablet by mouth 2 (two) times daily.  ? [DISCONTINUED] cetirizine (ZYRTEC) 10 MG tablet Take  10 mg by mouth daily.  ? [DISCONTINUED] levothyroxine (SYNTHROID) 25 MCG tablet TAKE 1 TABLET BY MOUTH EVERY DAY BEFORE BREAKFAST  ? ?No facility-administered medications prior to visit.  ? ? ?Review of Systems  ?HENT:  Positive for ear pain. Negative for ear discharge, hearing loss, rhinorrhea and sore throat.   ?Respiratory:  Negative for cough.   ?Gastrointestinal:   Negative for abdominal pain, diarrhea and vomiting.  ?Musculoskeletal:  Negative for neck pain.  ?Skin:  Negative for rash.  ?Neurological:  Negative for headaches.  ? ? ?  Objective  ?  ?BP (!) 110/51   Pulse 72   Temp (!) 97 ?F (36.1 ?C) (Temporal)   Resp 16   Wt 214 lb 6.4 oz (97.3 kg)   SpO2 98%   BMI 40.51 kg/m?  ? ? ?Physical Exam ?Vitals and nursing note reviewed.  ?Constitutional:   ?   General: She is not in acute distress. ?   Appearance: Normal appearance. She is overweight. She is not ill-appearing, toxic-appearing or diaphoretic.  ?HENT:  ?   Head: Normocephalic and atraumatic.  ?Cardiovascular:  ?   Rate and Rhythm: Normal rate and regular rhythm.  ?   Pulses: Normal pulses.  ?   Heart sounds: Normal heart sounds. No murmur heard. ?  No friction rub. No gallop.  ?Pulmonary:  ?   Effort: Pulmonary effort is normal. No respiratory distress.  ?   Breath sounds: Normal breath sounds. No stridor. No wheezing, rhonchi or rales.  ?Chest:  ?   Chest wall: No tenderness.  ?Abdominal:  ?   General: Bowel sounds are normal.  ?   Palpations: Abdomen is soft.  ?Musculoskeletal:     ?   General: No swelling, tenderness, deformity or signs of injury. Normal range of motion.  ?   Right lower leg: No edema.  ?   Left lower leg: No edema.  ?Skin: ?   General: Skin is warm and dry.  ?   Capillary Refill: Capillary refill takes less than 2 seconds.  ?   Coloration: Skin is not jaundiced or pale.  ?   Findings: No bruising, erythema, lesion or rash.  ?Neurological:  ?   General: No focal deficit present.  ?   Mental Status: She is alert and oriented to person, place, and time. Mental status is at baseline.  ?   Cranial Nerves: No cranial nerve deficit.  ?   Sensory: No sensory deficit.  ?   Motor: No weakness.  ?   Coordination: Coordination normal.  ?Psychiatric:     ?   Mood and Affect: Mood normal.     ?   Behavior: Behavior normal.     ?   Thought Content: Thought content normal.     ?   Judgment: Judgment  normal.  ?  ? ?Results for orders placed or performed in visit on 06/01/21  ?Comprehensive metabolic panel  ?Result Value Ref Range  ? Glucose 113 (H) 70 - 99 mg/dL  ? BUN 12 6 - 24 mg/dL  ? Creatinine, Ser 0.79 0.57 - 1.00 mg/dL  ? eGFR 88 >59 mL/min/1.73  ? BUN/Creatinine Ratio 15 9 - 23  ? Sodium 143 134 - 144 mmol/L  ? Potassium 3.7 3.5 - 5.2 mmol/L  ? Chloride 104 96 - 106 mmol/L  ? CO2 25 20 - 29 mmol/L  ? Calcium 9.3 8.7 - 10.2 mg/dL  ? Total Protein 6.7 6.0 - 8.5 g/dL  ? Albumin 4.2  3.8 - 4.9 g/dL  ? Globulin, Total 2.5 1.5 - 4.5 g/dL  ? Albumin/Globulin Ratio 1.7 1.2 - 2.2  ? Bilirubin Total 0.3 0.0 - 1.2 mg/dL  ? Alkaline Phosphatase 107 44 - 121 IU/L  ? AST 20 0 - 40 IU/L  ? ALT 17 0 - 32 IU/L  ?CBC with Differential/Platelet  ?Result Value Ref Range  ? WBC 6.9 3.4 - 10.8 x10E3/uL  ? RBC 4.19 3.77 - 5.28 x10E6/uL  ? Hemoglobin 14.4 11.1 - 15.9 g/dL  ? Hematocrit 41.7 34.0 - 46.6 %  ? MCV 100 (H) 79 - 97 fL  ? MCH 34.4 (H) 26.6 - 33.0 pg  ? MCHC 34.5 31.5 - 35.7 g/dL  ? RDW 13.5 11.7 - 15.4 %  ? Platelets 188 150 - 450 x10E3/uL  ? Neutrophils 65 Not Estab. %  ? Lymphs 26 Not Estab. %  ? Monocytes 5 Not Estab. %  ? Eos 3 Not Estab. %  ? Basos 1 Not Estab. %  ? Neutrophils Absolute 4.5 1.4 - 7.0 x10E3/uL  ? Lymphocytes Absolute 1.8 0.7 - 3.1 x10E3/uL  ? Monocytes Absolute 0.3 0.1 - 0.9 x10E3/uL  ? EOS (ABSOLUTE) 0.2 0.0 - 0.4 x10E3/uL  ? Basophils Absolute 0.1 0.0 - 0.2 x10E3/uL  ? Immature Granulocytes 0 Not Estab. %  ? Immature Grans (Abs) 0.0 0.0 - 0.1 x10E3/uL  ?Hemoglobin A1c  ?Result Value Ref Range  ? Hgb A1c MFr Bld 5.7 (H) 4.8 - 5.6 %  ? Est. average glucose Bld gHb Est-mCnc 117 mg/dL  ?Lipid panel  ?Result Value Ref Range  ? Cholesterol, Total 224 (H) 100 - 199 mg/dL  ? Triglycerides 283 (H) 0 - 149 mg/dL  ? HDL 34 (L) >39 mg/dL  ? VLDL Cholesterol Cal 51 (H) 5 - 40 mg/dL  ? LDL Chol Calc (NIH) 139 (H) 0 - 99 mg/dL  ? Chol/HDL Ratio 6.6 (H) 0.0 - 4.4 ratio  ?Vitamin D (25 hydroxy)  ?Result Value  Ref Range  ? Vit D, 25-Hydroxy 15.6 (L) 30.0 - 100.0 ng/mL  ?TSH + free T4  ?Result Value Ref Range  ? TSH 9.220 (H) 0.450 - 4.500 uIU/mL  ? Free T4 0.72 (L) 0.82 - 1.77 ng/dL  ?Drug Screen 12+Alcohol+CRT, Ur  ?Result Va

## 2021-06-01 ENCOUNTER — Encounter: Payer: Self-pay | Admitting: Family Medicine

## 2021-06-01 ENCOUNTER — Ambulatory Visit (INDEPENDENT_AMBULATORY_CARE_PROVIDER_SITE_OTHER): Payer: Medicare Other | Admitting: Family Medicine

## 2021-06-01 VITALS — BP 110/51 | HR 72 | Temp 97.0°F | Resp 16 | Wt 214.4 lb

## 2021-06-01 DIAGNOSIS — Z79899 Other long term (current) drug therapy: Secondary | ICD-10-CM | POA: Insufficient documentation

## 2021-06-01 DIAGNOSIS — J324 Chronic pansinusitis: Secondary | ICD-10-CM

## 2021-06-01 DIAGNOSIS — G43719 Chronic migraine without aura, intractable, without status migrainosus: Secondary | ICD-10-CM | POA: Diagnosis not present

## 2021-06-01 DIAGNOSIS — F1190 Opioid use, unspecified, uncomplicated: Secondary | ICD-10-CM | POA: Diagnosis not present

## 2021-06-01 DIAGNOSIS — E039 Hypothyroidism, unspecified: Secondary | ICD-10-CM | POA: Diagnosis not present

## 2021-06-01 DIAGNOSIS — Z1211 Encounter for screening for malignant neoplasm of colon: Secondary | ICD-10-CM

## 2021-06-01 DIAGNOSIS — R5382 Chronic fatigue, unspecified: Secondary | ICD-10-CM | POA: Diagnosis not present

## 2021-06-01 DIAGNOSIS — E559 Vitamin D deficiency, unspecified: Secondary | ICD-10-CM | POA: Diagnosis not present

## 2021-06-01 DIAGNOSIS — Z0289 Encounter for other administrative examinations: Secondary | ICD-10-CM | POA: Insufficient documentation

## 2021-06-01 DIAGNOSIS — Z1231 Encounter for screening mammogram for malignant neoplasm of breast: Secondary | ICD-10-CM | POA: Diagnosis not present

## 2021-06-01 DIAGNOSIS — E7800 Pure hypercholesterolemia, unspecified: Secondary | ICD-10-CM

## 2021-06-01 MED ORDER — AMOXICILLIN-POT CLAVULANATE 875-125 MG PO TABS: 1.0000 | ORAL_TABLET | Freq: Two times a day (BID) | ORAL | 0 refills | Status: DC

## 2021-06-01 MED ORDER — CETIRIZINE HCL 10 MG PO TABS: 10.0000 mg | ORAL_TABLET | Freq: Every day | ORAL | 1 refills | Status: DC

## 2021-06-03 ENCOUNTER — Encounter: Payer: Self-pay | Admitting: Family Medicine

## 2021-06-03 ENCOUNTER — Encounter: Payer: Self-pay | Admitting: Dermatology

## 2021-06-03 ENCOUNTER — Other Ambulatory Visit: Payer: Self-pay | Admitting: Physician Assistant

## 2021-06-03 ENCOUNTER — Other Ambulatory Visit: Payer: Self-pay | Admitting: Family Medicine

## 2021-06-03 DIAGNOSIS — R7303 Prediabetes: Secondary | ICD-10-CM

## 2021-06-03 DIAGNOSIS — E039 Hypothyroidism, unspecified: Secondary | ICD-10-CM | POA: Insufficient documentation

## 2021-06-03 DIAGNOSIS — G43719 Chronic migraine without aura, intractable, without status migrainosus: Secondary | ICD-10-CM | POA: Insufficient documentation

## 2021-06-03 DIAGNOSIS — Z1231 Encounter for screening mammogram for malignant neoplasm of breast: Secondary | ICD-10-CM | POA: Insufficient documentation

## 2021-06-03 DIAGNOSIS — Z833 Family history of diabetes mellitus: Secondary | ICD-10-CM

## 2021-06-03 DIAGNOSIS — R5382 Chronic fatigue, unspecified: Secondary | ICD-10-CM | POA: Insufficient documentation

## 2021-06-03 DIAGNOSIS — G43011 Migraine without aura, intractable, with status migrainosus: Secondary | ICD-10-CM

## 2021-06-03 DIAGNOSIS — Z1211 Encounter for screening for malignant neoplasm of colon: Secondary | ICD-10-CM | POA: Insufficient documentation

## 2021-06-03 DIAGNOSIS — E559 Vitamin D deficiency, unspecified: Secondary | ICD-10-CM

## 2021-06-03 MED ORDER — VITAMIN D (ERGOCALCIFEROL) 1.25 MG (50000 UNIT) PO CAPS: 50000.0000 [IU] | ORAL_CAPSULE | ORAL | 1 refills | Status: DC

## 2021-06-03 MED ORDER — LEVOTHYROXINE SODIUM 75 MCG PO TABS: 75.0000 ug | ORAL_TABLET | Freq: Every day | ORAL | 0 refills | Status: DC

## 2021-06-03 NOTE — Assessment & Plan Note (Signed)
Chronic, stable ?Will speak with referral coordinator to assist with scheduling given lack of scheduling from neurology ?Encourage cessation of BC powder d/t rebound issues  ?Likely worsened with chronic sinus involvement  ?

## 2021-06-03 NOTE — Assessment & Plan Note (Signed)
Chronic, previously stable ?On crestor 10 mg ?Will repeat NFLP ?We recommend diet low in saturated fat and regular exercise - 30 min at least 5 times per week ? ?

## 2021-06-03 NOTE — Assessment & Plan Note (Signed)
Hx of Avitaminosis D; will repeat Vit D ?Have discussed link between low Vit D and overall fatigue ?Will repeat labs and recommend supplementation if needed  ?

## 2021-06-03 NOTE — Assessment & Plan Note (Signed)
Due for screening for mammogram, denies breast concerns, provided with phone number to call and schedule appointment for mammogram. Encouraged to repeat breast cancer screening every 1-2 years.  

## 2021-06-03 NOTE — Assessment & Plan Note (Addendum)
Chronic use ?Will get UDS today to establish baseline ?Has been on Xanax 1 mg BID ?Pt is aware of risks of psychoactive medication use to include increased sedation, respiratory suppression, falls, extrapyramidal movements,  dependence and cardiovascular events.  Pt would like to continue treatment as benefit determined to outweigh risk.   ? ?

## 2021-06-03 NOTE — Assessment & Plan Note (Signed)
Body mass index is 40.51 kg/m?.  ?Associated with chronic pain, depression/anxiety. ?Discussed importance of healthy weight management ?Discussed diet and exercise ? ?

## 2021-06-03 NOTE — Assessment & Plan Note (Addendum)
Chronic, recurrent ?Linked to L ear pain ?No ear infection noted on assessment ?Has been 3 months since last sinuses were previously treated ?Patient has been seen by ENT since last OV ?Will treat again; encourage use of daily anti-histamines and reduction of allergens including smoking ?Recommend: ?Nasal spray- Ocean nasal spray or similar, to ensure nasal passageways are moist and not dry ?Nasal steroid- Flonase, or similar, assist with decrease inflammation, may not notice effect for 4 days or longer, can take 2 x/day. Best to blow nose prior to use.  ?Delym or Robitussin DM- to assist with cough, follow directions on package ?Mucinex DM- ensure 32-64 oz of water to activate ?Throat lozenges, sugar free candies, water- keep throat moistened  ? ?

## 2021-06-03 NOTE — Assessment & Plan Note (Addendum)
Wish to establish pain contract; will do UDS today ?Has been chronically on endocet 7.5 mg BID ? ?

## 2021-06-03 NOTE — Assessment & Plan Note (Signed)
Due for screening; denies current complaints ?

## 2021-06-03 NOTE — Assessment & Plan Note (Signed)
Recommend labs to assist with unknown cause ?-hx of hypothyroidism ?-hx of obesity ?-hx of depression/anxiety  ?

## 2021-06-03 NOTE — Assessment & Plan Note (Signed)
Chronic, previously on 25 mcg  ?Will repeat testing ?Recommend increase dose if indicated ?F/u in 3 months  ?

## 2021-06-04 ENCOUNTER — Telehealth: Payer: Self-pay

## 2021-06-04 ENCOUNTER — Other Ambulatory Visit: Payer: Self-pay

## 2021-06-04 DIAGNOSIS — H0263 Xanthelasma of right eye, unspecified eyelid: Secondary | ICD-10-CM

## 2021-06-04 NOTE — Telephone Encounter (Signed)
Advised pt that a referral has been sent to Dr. Wallace Going at [facility].  Advised pt if she has not heard from [facility] in a week to call our office so we can check on status of referral./sh ?

## 2021-06-04 NOTE — Progress Notes (Signed)
Referral to Dr. Mali Brasington./sh ?

## 2021-06-05 ENCOUNTER — Telehealth: Payer: Self-pay

## 2021-06-05 NOTE — Telephone Encounter (Signed)
-----   Message from Ralene Bathe, MD sent at 06/04/2021  5:56 PM EDT ----- ?Diagnosis ?1. Skin , right shoulder anterior ?MELANOCYTIC NEVUS, INTRADERMAL TYPE, BASE INVOLVED ?2. Skin , mid back R paraspinal ?MELANOCYTIC NEVUS, INTRADERMAL TYPE, BASE INVOLVED ?3. Skin , R low back 3.0cm lat to midline ?MELANOCYTIC NEVUS, INTRADERMAL TYPE, BASE INVOLVED ?4. Skin , left lower back 14cm lat to midline ?MELANOCYTIC NEVUS, INTRADERMAL TYPE, BASE INVOLVED ? ?1, 2, 3, 4--all 4 are benign moles ?No further treatment needed. ?

## 2021-06-05 NOTE — Telephone Encounter (Signed)
Patient informed of pathology results 

## 2021-06-06 ENCOUNTER — Other Ambulatory Visit: Payer: Self-pay | Admitting: Family Medicine

## 2021-06-06 DIAGNOSIS — F900 Attention-deficit hyperactivity disorder, predominantly inattentive type: Secondary | ICD-10-CM

## 2021-06-06 DIAGNOSIS — M778 Other enthesopathies, not elsewhere classified: Secondary | ICD-10-CM

## 2021-06-06 DIAGNOSIS — F419 Anxiety disorder, unspecified: Secondary | ICD-10-CM

## 2021-06-06 NOTE — Telephone Encounter (Signed)
Medication Refill - Medication: oxyCODONE-acetaminophen (ENDOCET) 7.5-325 MG tablet  ?ALPRAZolam (XANAX) 1 MG tablet  ?amphetamine-dextroamphetamine (ADDERALL) 30 MG tablet ? ?Has the patient contacted their pharmacy? Yes.   ?(Agent: If no, request that the patient contact the pharmacy for the refill. If patient does not wish to contact the pharmacy document the reason why and proceed with request.) ?(Agent: If yes, when and what did the pharmacy advise?) ? ?Preferred Pharmacy (with phone number or street name):  ?Has the patient been seen for an appointment in the last year OR does the patient have an upcoming appointment? Yes.   ?[id number] [place] [id number] ?Phone: [phone]: [phone] ? ? ?Agent: Please be advised that RX refills may take up to 3 business days. We ask that you follow-up with your pharmacy. ? ?

## 2021-06-07 NOTE — Telephone Encounter (Signed)
Requested medication (s) are due for refill today:   Provider to review for all 3 ? ?Requested medication (s) are on the active medication list:   Yes for all 3 ? ?Future visit scheduled:   No     Seen 6 days ago ? ? ?Last ordered: Endocet 05/03/2021 #60, 0 refills;   Xanax 05/03/2021 #60, 0 refills;   Adderall #60, 0 refills ? ?Returned because all non delegated refills ? ?Requested Prescriptions  ?Pending Prescriptions Disp Refills  ? ALPRAZolam (XANAX) 1 MG tablet 60 tablet 0  ?  Sig: Take 1 tablet (1 mg total) by mouth 2 (two) times daily.  ?  ? Not Delegated - Psychiatry: Anxiolytics/Hypnotics 2 Failed - 06/06/2021 11:35 AM  ?  ?  Failed - This refill cannot be delegated  ?  ?  Failed - Urine Drug Screen completed in last 360 days  ?  ?  Passed - Patient is not pregnant  ?  ?  Passed - Valid encounter within last 6 months  ?  Recent Outpatient Visits   ? ?      ? 6 days ago Chronic pansinusitis  ? [facility] Tally Joe T, FNP  ? 3 months ago Chronic pansinusitis  ? [facility] Tally Joe T, FNP  ? 4 months ago Chronic pansinusitis  ? [facility] Tally Joe T, FNP  ? 6 months ago Intractable migraine without aura and with status migrainosus  ? [facility] Tally Joe T, FNP  ? 1 year ago Annual physical exam  ? [facility] Fenton Malling M, [place]  ? ?  ?  ? ?  ?  ?  ? amphetamine-dextroamphetamine (ADDERALL) 30 MG tablet 60 tablet 0  ?  Sig: Take 1 tablet by mouth 2 (two) times daily.  ?  ? Not Delegated - Psychiatry:  Stimulants/ADHD Failed - 06/06/2021 11:35 AM  ?  ?  Failed - This refill cannot be delegated  ?  ?  Failed - Urine Drug Screen completed in last 360 days  ?  ?  Passed - Last BP in normal range  ?  BP Readings from Last 1 Encounters:  ?06/01/21 (!) 110/51  ?  ?  ?  ?  Passed - Last Heart Rate in normal range  ?  Pulse Readings from Last 1 Encounters:  ?06/01/21 72  ?  ?  ?  ?  Passed - Valid encounter within last 6  months  ?  Recent Outpatient Visits   ? ?      ? 6 days ago Chronic pansinusitis  ? [facility] Tally Joe T, FNP  ? 3 months ago Chronic pansinusitis  ? [facility] Tally Joe T, FNP  ? 4 months ago Chronic pansinusitis  ? [facility] Tally Joe T, FNP  ? 6 months ago Intractable migraine without aura and with status migrainosus  ? [facility] Tally Joe T, FNP  ? 1 year ago Annual physical exam  ? [facility] Fenton Malling M, [place]  ? ?  ?  ? ?  ?  ?  ? oxyCODONE-acetaminophen (ENDOCET) 7.5-325 MG tablet 60 tablet 0  ?  Sig: Take 1 tablet by mouth 2 (two) times daily.  ?  ? Not Delegated - Analgesics:  Opioid Agonist Combinations Failed - 06/06/2021 11:35 AM  ?  ?  Failed - This refill cannot be delegated  ?  ?  Failed - Urine Drug  Screen completed in last 360 days  ?  ?  Passed - Valid encounter within last 3 months  ?  Recent Outpatient Visits   ? ?      ? 6 days ago Chronic pansinusitis  ? [facility] Tally Joe T, FNP  ? 3 months ago Chronic pansinusitis  ? [facility] Tally Joe T, FNP  ? 4 months ago Chronic pansinusitis  ? [facility] Tally Joe T, FNP  ? 6 months ago Intractable migraine without aura and with status migrainosus  ? [facility] Tally Joe T, FNP  ? 1 year ago Annual physical exam  ? [facility], [place] Sorrel, [place]  ? ?  ?  ? ?  ?  ?  ? ?

## 2021-06-08 LAB — CBC WITH DIFFERENTIAL/PLATELET
Basophils Absolute: 0.1 10*3/uL (ref 0.0–0.2)
Basos: 1 %
EOS (ABSOLUTE): 0.2 10*3/uL (ref 0.0–0.4)
Eos: 3 %
Hematocrit: 41.7 % (ref 34.0–46.6)
Hemoglobin: 14.4 g/dL (ref 11.1–15.9)
Immature Grans (Abs): 0 10*3/uL (ref 0.0–0.1)
Immature Granulocytes: 0 %
Lymphocytes Absolute: 1.8 10*3/uL (ref 0.7–3.1)
Lymphs: 26 %
MCH: 34.4 pg — ABNORMAL HIGH (ref 26.6–33.0)
MCHC: 34.5 g/dL (ref 31.5–35.7)
MCV: 100 fL — ABNORMAL HIGH (ref 79–97)
Monocytes Absolute: 0.3 10*3/uL (ref 0.1–0.9)
Monocytes: 5 %
Neutrophils Absolute: 4.5 10*3/uL (ref 1.4–7.0)
Neutrophils: 65 %
Platelets: 188 10*3/uL (ref 150–450)
RBC: 4.19 x10E6/uL (ref 3.77–5.28)
RDW: 13.5 % (ref 11.7–15.4)
WBC: 6.9 10*3/uL (ref 3.4–10.8)

## 2021-06-08 LAB — LIPID PANEL
Chol/HDL Ratio: 6.6 ratio — ABNORMAL HIGH (ref 0.0–4.4)
Cholesterol, Total: 224 mg/dL — ABNORMAL HIGH (ref 100–199)
HDL: 34 mg/dL — ABNORMAL LOW (ref >39)
LDL Chol Calc (NIH): 139 mg/dL — ABNORMAL HIGH (ref 0–99)
Triglycerides: 283 mg/dL — ABNORMAL HIGH (ref 0–149)
VLDL Cholesterol Cal: 51 mg/dL — ABNORMAL HIGH (ref 5–40)

## 2021-06-08 LAB — COMPREHENSIVE METABOLIC PANEL
ALT: 17 IU/L (ref 0–32)
AST: 20 IU/L (ref 0–40)
Albumin/Globulin Ratio: 1.7 (ref 1.2–2.2)
Albumin: 4.2 g/dL (ref 3.8–4.9)
Alkaline Phosphatase: 107 IU/L (ref 44–121)
BUN/Creatinine Ratio: 15 (ref 9–23)
BUN: 12 mg/dL (ref 6–24)
Bilirubin Total: 0.3 mg/dL (ref 0.0–1.2)
CO2: 25 mmol/L (ref 20–29)
Calcium: 9.3 mg/dL (ref 8.7–10.2)
Chloride: 104 mmol/L (ref 96–106)
Creatinine, Ser: 0.79 mg/dL (ref 0.57–1.00)
Globulin, Total: 2.5 g/dL (ref 1.5–4.5)
Glucose: 113 mg/dL — ABNORMAL HIGH (ref 70–99)
Potassium: 3.7 mmol/L (ref 3.5–5.2)
Sodium: 143 mmol/L (ref 134–144)
Total Protein: 6.7 g/dL (ref 6.0–8.5)
eGFR: 88 mL/min/{1.73_m2} (ref >59)

## 2021-06-08 LAB — DRUG SCREEN 12+ALCOHOL+CRT, UR
Amphetamines, Urine: NEGATIVE ng/mL
BENZODIAZ UR QL: NEGATIVE ng/mL
Barbiturate: NEGATIVE ng/mL
Cannabinoids: NEGATIVE ng/mL
Cocaine (Metabolite): NEGATIVE ng/mL
Creatinine, Urine: 251.1 mg/dL (ref 20.0–300.0)
Ethanol, Urine: NEGATIVE %
Meperidine: NEGATIVE ng/mL
Methadone: NEGATIVE ng/mL
OPIATE SCREEN URINE: NEGATIVE ng/mL
Oxycodone/Oxymorphone, Urine: POSITIVE — AB
Phencyclidine: NEGATIVE ng/mL
Propoxyphene: NEGATIVE ng/mL
Tramadol: NEGATIVE ng/mL

## 2021-06-08 LAB — VITAMIN D 25 HYDROXY (VIT D DEFICIENCY, FRACTURES): Vit D, 25-Hydroxy: 15.6 ng/mL — ABNORMAL LOW (ref 30.0–100.0)

## 2021-06-08 LAB — HEMOGLOBIN A1C
Est. average glucose Bld gHb Est-mCnc: 117 mg/dL
Hgb A1c MFr Bld: 5.7 % — ABNORMAL HIGH (ref 4.8–5.6)

## 2021-06-08 LAB — TSH+FREE T4
Free T4: 0.72 ng/dL — ABNORMAL LOW (ref 0.82–1.77)
TSH: 9.22 u[IU]/mL — ABNORMAL HIGH (ref 0.450–4.500)

## 2021-06-08 MED ORDER — OXYCODONE-ACETAMINOPHEN 7.5-325 MG PO TABS: 1.0000 | ORAL_TABLET | Freq: Two times a day (BID) | ORAL | 0 refills | Status: DC

## 2021-06-08 NOTE — Telephone Encounter (Signed)
Patient advised. Follow up appointment scheduled for 06/14/2021.  ?

## 2021-06-08 NOTE — Telephone Encounter (Signed)
[name], FNP sent to [facility] Nurse ?I will fill her opioid now; however, pt reported not taking xanax and adderall at time of appt. Neither was seen on UDS as a result, she should not need either at this time. She needs to present back to sign controlled paperwork within the next month for all substances; recommend reduction of quantity given combined use with opioids.  ? ?[name], FNP  ?[place]  ?[id number]  ?[place], [place] [address]  ?[phone] (phone)  ?[phone] (fax)  ?[facility]  ?

## 2021-06-14 ENCOUNTER — Ambulatory Visit (INDEPENDENT_AMBULATORY_CARE_PROVIDER_SITE_OTHER): Payer: Medicare Other | Admitting: Family Medicine

## 2021-06-14 ENCOUNTER — Encounter: Payer: Self-pay | Admitting: Family Medicine

## 2021-06-14 DIAGNOSIS — M542 Cervicalgia: Secondary | ICD-10-CM

## 2021-06-14 DIAGNOSIS — G8929 Other chronic pain: Secondary | ICD-10-CM | POA: Insufficient documentation

## 2021-06-14 DIAGNOSIS — F900 Attention-deficit hyperactivity disorder, predominantly inattentive type: Secondary | ICD-10-CM

## 2021-06-14 DIAGNOSIS — R52 Pain, unspecified: Secondary | ICD-10-CM | POA: Diagnosis not present

## 2021-06-14 DIAGNOSIS — F419 Anxiety disorder, unspecified: Secondary | ICD-10-CM

## 2021-06-14 MED ORDER — ALPRAZOLAM 1 MG PO TABS: ORAL_TABLET | ORAL | 0 refills | Status: DC

## 2021-06-14 MED ORDER — AMPHETAMINE-DEXTROAMPHETAMINE 30 MG PO TABS: 1.0000 | ORAL_TABLET | Freq: Two times a day (BID) | ORAL | 0 refills | Status: DC

## 2021-06-14 NOTE — Assessment & Plan Note (Signed)
Chronic, stable ?Patient agreeable to decrease to 45 tabs/month d/t chronic opioid ?UDS received last OV ?

## 2021-06-14 NOTE — Progress Notes (Signed)
?  ? ?[employer] as a Education administrator for [name], FNP.,have documented all relevant documentation on the behalf of [name], FNP,as directed by  [name], FNP while in the presence of [name], FNP.  ? ?Established patient visit ? ? ?Patient: Kathleen Buchanan   DOB: 1965-07-08   56 y.o. Female  MRN: [id number] ?Visit Date: 06/14/2021 ? ?Today's healthcare provider: [name], FNP  ? ?Re Introduced to nurse practitioner role and practice setting.  All questions answered.  Discussed provider/patient relationship and expectations. ? ?Subjective  ?  ?HPI  ? ? ?Medications: ?Outpatient Medications Prior to Visit  ?Medication Sig  ? acetaminophen (TYLENOL) 500 MG tablet Take 1,000 mg by mouth every 6 (six) hours as needed.   ? albuterol (VENTOLIN HFA) 108 (90 Base) MCG/ACT inhaler INHALE 2 PUFFS INTO THE LUNGS EVERY 4 HOURS AS NEEDED FOR WHEEZE OR FOR SHORTNESS OF BREATH  ? amoxicillin-clavulanate (AUGMENTIN) 875-125 MG tablet Take 1 tablet by mouth 2 (two) times daily.  ? ascorbic acid (VITAMIN C) 500 MG tablet Take 1,000 mg by mouth daily.   ? Aspirin-Caffeine (BC FAST PAIN RELIEF PO) Take by mouth as needed.  ? Blood Glucose Monitoring Suppl (ONETOUCH VERIO) w/Device KIT To check blood sugar daily for diabetes  ? cetirizine (ZYRTEC) 10 MG tablet Take 1 tablet (10 mg total) by mouth daily.  ? EPINEPHrine 0.3 mg/0.3 mL IJ SOAJ injection Inject into the muscle as needed.  ? hydrochlorothiazide (HYDRODIURIL) 12.5 MG tablet TAKE 1 TABLET BY MOUTH EVERY DAY  ? ibuprofen (ADVIL,MOTRIN) 200 MG tablet Take 4 tablets by mouth as needed.  ? Lancets (ONETOUCH ULTRASOFT) lancets To check blood sugar daily for diabetes  ? levothyroxine (SYNTHROID) 75 MCG tablet Take 1 tablet (75 mcg total) by mouth daily.  ? ONETOUCH VERIO test strip TO CHECK BLOOD SUGAR DAILY FOR DIABETES  ? oxyCODONE-acetaminophen (ENDOCET) 7.5-325 MG tablet Take 1 tablet by mouth 2 (two) times daily.  ? pantoprazole (PROTONIX) 40  MG tablet TAKE 1 TABLET BY MOUTH EVERY DAY  ? predniSONE (DELTASONE) 20 MG tablet Take 1 tablet (20 mg total) by mouth daily with breakfast.  ? propranolol (INDERAL) 20 MG tablet TAKE 1 TABLET BY MOUTH TWICE A DAY  ? rosuvastatin (CRESTOR) 10 MG tablet TAKE 1 TABLET BY MOUTH EVERY DAY  ? sodium chloride (OCEAN) 0.65 % SOLN nasal spray Place 1 spray into both nostrils as needed for congestion.  ? Vitamin D, Ergocalciferol, (DRISDOL) 1.25 MG (50000 UNIT) CAPS capsule Take 1 capsule (50,000 Units total) by mouth every 7 (seven) days.  ? [DISCONTINUED] ALPRAZolam (XANAX) 1 MG tablet Take 1 tablet (1 mg total) by mouth 2 (two) times daily.  ? [DISCONTINUED] amphetamine-dextroamphetamine (ADDERALL) 30 MG tablet Take 1 tablet by mouth 2 (two) times daily.  ? ?No facility-administered medications prior to visit.  ? ? ?Review of Systems ? ? ?  Objective  ?  ?There were no vitals taken for this visit. ? ? ?Physical Exam ?Vitals and nursing note reviewed.  ?Constitutional:   ?   General: She is not in acute distress. ?   Appearance: Normal appearance. She is obese. She is not ill-appearing, toxic-appearing or diaphoretic.  ?HENT:  ?   Head: Normocephalic and atraumatic.  ?Cardiovascular:  ?   Rate and Rhythm: Normal rate and regular rhythm.  ?   Pulses: Normal pulses.  ?   Heart sounds: Normal heart sounds. No murmur heard. ?  No friction  rub. No gallop.  ?Pulmonary:  ?   Effort: Pulmonary effort is normal. No respiratory distress.  ?   Breath sounds: Normal breath sounds. No stridor. No wheezing, rhonchi or rales.  ?Chest:  ?   Chest wall: No tenderness.  ?Abdominal:  ?   General: Bowel sounds are normal.  ?   Palpations: Abdomen is soft.  ?Musculoskeletal:     ?   General: No swelling, deformity or signs of injury.  ?   Cervical back: Tenderness present. Pain with movement and muscular tenderness present. Decreased range of motion.  ?   Right lower leg: No edema.  ?   Left lower leg: No edema.  ?Skin: ?   General: Skin is  warm and dry.  ?   Capillary Refill: Capillary refill takes less than 2 seconds.  ?   Coloration: Skin is not jaundiced or pale.  ?   Findings: No bruising, erythema, lesion or rash.  ?Neurological:  ?   General: No focal deficit present.  ?   Mental Status: She is alert and oriented to person, place, and time. Mental status is at baseline.  ?   Cranial Nerves: No cranial nerve deficit.  ?   Sensory: No sensory deficit.  ?   Motor: No weakness.  ?   Coordination: Coordination normal.  ?Psychiatric:     ?   Mood and Affect: Mood normal.     ?   Behavior: Behavior normal.     ?   Thought Content: Thought content normal.     ?   Judgment: Judgment normal.  ?  ? ?No results found for any visits on 06/14/21. ? Assessment & Plan  ?  ? ?Problem List Items Addressed This Visit   ? ?  ? Other  ? ADD (attention deficit disorder)  ?  Chronic, stable ?Non opioid contract signed; UDS received at date of last OV ?  ?  ? Relevant Medications  ? amphetamine-dextroamphetamine (ADDERALL) 30 MG tablet  ? Anxiety  ?  Chronic, stable ?Patient agreeable to decrease to 45 tabs/month d/t chronic opioid ?UDS received last OV ?  ?  ? Relevant Medications  ? ALPRAZolam (XANAX) 1 MG tablet  ? Neck pain, chronic - Primary  ?  Chronic, undiagnosed ?Wishes to be seen by specialist ?Patient noted that lack of ROM is impeding with driving ?Pt also has chronic headaches and is trying to get in with neurology ?Recommend ortho referral with imaging provided by ortho to assess neck bones and concern for possible crepitus vs bone spurs ?Pt notes positive family hx of neck concerns in siblings and her parents as well ?  ?  ? Relevant Orders  ? Ambulatory referral to Orthopedic Surgery  ? Pain management  ?  Pain contract signed today; UDS received at last OV ?  ?  ? ? ? ?Return in about 4 weeks (around 07/12/2021) for neck pain.  ?   ?I, [name], FNP, have reviewed all documentation for this visit. The documentation on 06/14/21 for the exam,  diagnosis, procedures, and orders are all accurate and complete. ? ? ?[name], FNP  ?[place] ?[phone] (phone) ?[phone] (fax) ? ?[facility] ?

## 2021-06-14 NOTE — Assessment & Plan Note (Signed)
Pain contract signed today; UDS received at last OV ?

## 2021-06-14 NOTE — Assessment & Plan Note (Signed)
Chronic, stable ?Non opioid contract signed; UDS received at date of last OV ?

## 2021-06-14 NOTE — Assessment & Plan Note (Signed)
Chronic, undiagnosed ?Wishes to be seen by specialist ?Patient noted that lack of ROM is impeding with driving ?Pt also has chronic headaches and is trying to get in with neurology ?Recommend ortho referral with imaging provided by ortho to assess neck bones and concern for possible crepitus vs bone spurs ?Pt notes positive family hx of neck concerns in siblings and her parents as well ?

## 2021-07-05 ENCOUNTER — Ambulatory Visit (INDEPENDENT_AMBULATORY_CARE_PROVIDER_SITE_OTHER): Payer: Medicare Other

## 2021-07-05 VITALS — Wt 214.0 lb

## 2021-07-05 DIAGNOSIS — Z0000 Encounter for general adult medical examination without abnormal findings: Secondary | ICD-10-CM | POA: Diagnosis not present

## 2021-07-05 NOTE — Patient Instructions (Signed)
Kathleen Buchanan , ?Thank you for taking time to come for your Medicare Wellness Visit. I appreciate your ongoing commitment to your health goals. Please review the following plan we discussed and let me know if I can assist you in the future.  ? ?Screening recommendations/referrals: ?Colonoscopy: declined referral ?Mammogram: declined referral ?Bone Density: too young ?Recommended yearly ophthalmology/optometry visit for glaucoma screening and checkup ?Recommended yearly dental visit for hygiene and checkup ? ?Vaccinations: ?Influenza vaccine: n/d ?Pneumococcal vaccine: n/d ?Tdap vaccine: 06/23/19 ?Shingles vaccine: n/d  ?Covid-19: n/d ? ?Advanced directives: no ? ?Conditions/risks identified: none ? ?Next appointment: Follow up in one year for your annual wellness visit. 07/10/22 @ 1pm by phone ? ?Preventive Care 40-64 Years, Female ?Preventive care refers to lifestyle choices and visits with your health care provider that can promote health and wellness. ?What does preventive care include? ?A yearly physical exam. This is also called an annual well check. ?Dental exams once or twice a year. ?Routine eye exams. Ask your health care provider how often you should have your eyes checked. ?Personal lifestyle choices, including: ?Daily care of your teeth and gums. ?Regular physical activity. ?Eating a healthy diet. ?Avoiding tobacco and drug use. ?Limiting alcohol use. ?Practicing safe sex. ?Taking low-dose aspirin daily starting at age 22. ?Taking vitamin and mineral supplements as recommended by your health care provider. ?What happens during an annual well check? ?The services and screenings done by your health care provider during your annual well check will depend on your age, overall health, lifestyle risk factors, and family history of disease. ?Counseling  ?Your health care provider may ask you questions about your: ?Alcohol use. ?Tobacco use. ?Drug use. ?Emotional well-being. ?Home and relationship well-being. ?Sexual  activity. ?Eating habits. ?Work and work Statistician. ?Method of birth control. ?Menstrual cycle. ?Pregnancy history. ?Screening  ?You may have the following tests or measurements: ?Height, weight, and BMI. ?Blood pressure. ?Lipid and cholesterol levels. These may be checked every 5 years, or more frequently if you are over 67 years old. ?Skin check. ?Lung cancer screening. You may have this screening every year starting at age 76 if you have a 30-pack-year history of smoking and currently smoke or have quit within the past 15 years. ?Fecal occult blood test (FOBT) of the stool. You may have this test every year starting at age 87. ?Flexible sigmoidoscopy or colonoscopy. You may have a sigmoidoscopy every 5 years or a colonoscopy every 10 years starting at age 23. ?Hepatitis C blood test. ?Hepatitis B blood test. ?Sexually transmitted disease (STD) testing. ?Diabetes screening. This is done by checking your blood sugar (glucose) after you have not eaten for a while (fasting). You may have this done every 1-3 years. ?Mammogram. This may be done every 1-2 years. Talk to your health care provider about when you should start having regular mammograms. This may depend on whether you have a family history of breast cancer. ?BRCA-related cancer screening. This may be done if you have a family history of breast, ovarian, tubal, or peritoneal cancers. ?Pelvic exam and Pap test. This may be done every 3 years starting at age 19. Starting at age 49, this may be done every 5 years if you have a Pap test in combination with an HPV test. ?Bone density scan. This is done to screen for osteoporosis. You may have this scan if you are at high risk for osteoporosis. ?Discuss your test results, treatment options, and if necessary, the need for more tests with your health care provider. ?Vaccines  ?  Your health care provider may recommend certain vaccines, such as: ?Influenza vaccine. This is recommended every year. ?Tetanus, diphtheria,  and acellular pertussis (Tdap, Td) vaccine. You may need a Td booster every 10 years. ?Zoster vaccine. You may need this after age 62. ?Pneumococcal 13-valent conjugate (PCV13) vaccine. You may need this if you have certain conditions and were not previously vaccinated. ?Pneumococcal polysaccharide (PPSV23) vaccine. You may need one or two doses if you smoke cigarettes or if you have certain conditions. ?Talk to your health care provider about which screenings and vaccines you need and how often you need them. ?This information is not intended to replace advice given to you by your health care provider. Make sure you discuss any questions you have with your health care provider. ?Document Released: 03/24/2015 Document Revised: 11/15/2015 Document Reviewed: 12/27/2014 ?Elsevier Interactive Patient Education ? 2017 [employer] ? ? ? ?Fall Prevention in the Home ?Falls can cause injuries. They can happen to people of all ages. There are many things you can do to make your home safe and to help prevent falls. ?What can I do on the outside of my home? ?Regularly fix the edges of walkways and driveways and fix any cracks. ?Remove anything that might make you trip as you walk through a door, such as a raised step or threshold. ?Trim any bushes or trees on the path to your home. ?Use bright outdoor lighting. ?Clear any walking paths of anything that might make someone trip, such as rocks or tools. ?Regularly check to see if handrails are loose or broken. Make sure that both sides of any steps have handrails. ?Any raised decks and porches should have guardrails on the edges. ?Have any leaves, snow, or ice cleared regularly. ?Use sand or salt on walking paths during winter. ?Clean up any spills in your garage right away. This includes oil or grease spills. ?What can I do in the bathroom? ?Use night lights. ?Install grab bars by the toilet and in the tub and shower. Do not use towel bars as grab bars. ?Use non-skid mats or  decals in the tub or shower. ?If you need to sit down in the shower, use a plastic, non-slip stool. ?Keep the floor dry. Clean up any water that spills on the floor as soon as it happens. ?Remove soap buildup in the tub or shower regularly. ?Attach bath mats securely with double-sided non-slip rug tape. ?Do not have throw rugs and other things on the floor that can make you trip. ?What can I do in the bedroom? ?Use night lights. ?Make sure that you have a light by your bed that is easy to reach. ?Do not use any sheets or blankets that are too big for your bed. They should not hang down onto the floor. ?Have a firm chair that has side arms. You can use this for support while you get dressed. ?Do not have throw rugs and other things on the floor that can make you trip. ?What can I do in the kitchen? ?Clean up any spills right away. ?Avoid walking on wet floors. ?Keep items that you use a lot in easy-to-reach places. ?If you need to reach something above you, use a strong step stool that has a grab bar. ?Keep electrical cords out of the way. ?Do not use floor polish or wax that makes floors slippery. If you must use wax, use non-skid floor wax. ?Do not have throw rugs and other things on the floor that can make you trip. ?What  can I do with my stairs? ?Do not leave any items on the stairs. ?Make sure that there are handrails on both sides of the stairs and use them. Fix handrails that are broken or loose. Make sure that handrails are as long as the stairways. ?Check any carpeting to make sure that it is firmly attached to the stairs. Fix any carpet that is loose or worn. ?Avoid having throw rugs at the top or bottom of the stairs. If you do have throw rugs, attach them to the floor with carpet tape. ?Make sure that you have a light switch at the top of the stairs and the bottom of the stairs. If you do not have them, ask someone to add them for you. ?What else can I do to help prevent falls? ?Wear shoes that: ?Do not  have high heels. ?Have rubber bottoms. ?Are comfortable and fit you well. ?Are closed at the toe. Do not wear sandals. ?If you use a stepladder: ?Make sure that it is fully opened. Do not climb a close

## 2021-07-05 NOTE — Progress Notes (Signed)
?Virtual Visit via Telephone Note ? ?I connected with  Kathleen Buchanan on 07/05/21 at  1:00 PM EDT by telephone and verified that I am speaking with the correct person using two identifiers. ? ?Location: ?Patient: home ?Provider: [facility] ?Persons participating in the virtual visit: patient/Nurse Health Advisor ?  ?I discussed the limitations, risks, security and privacy concerns of performing an evaluation and management service by telephone and the availability of in person appointments. The patient expressed understanding and agreed to proceed. ? ?Interactive audio and video telecommunications were attempted between this nurse and patient, however failed, due to patient having technical difficulties OR patient did not have access to video capability.  We continued and completed visit with audio only. ? ?Some vital signs may be absent or patient reported.  ? ?Kathleen David, LPN ? ?Subjective:  ? Kathleen Buchanan is a 56 y.o. female who presents for Medicare Annual (Subsequent) preventive examination. ? ?Review of Systems    ? ?  ? ?   ?Objective:  ?  ?There were no vitals filed for this visit. ?There is no height or weight on file to calculate BMI. ? ? ?  12/14/2019  ?  2:12 PM 11/25/2018  ?  2:07 PM 11/08/2016  ?  1:41 PM 07/25/2015  ?  7:07 PM 11/08/2014  ? 11:08 AM 09/15/2014  ?  2:11 PM  ?Advanced Directives  ?Does Patient Have a Medical Advance Directive? No No No No No No  ?Would patient like information on creating a medical advance directive? No - Patient declined No - Patient declined No - Patient declined No - patient declined information  No - patient declined information  ? ? ?Current Medications (verified) ?Outpatient Encounter Medications as of 07/05/2021  ?Medication Sig  ? acetaminophen (TYLENOL) 500 MG tablet Take 1,000 mg by mouth every 6 (six) hours as needed.   ? albuterol (VENTOLIN HFA) 108 (90 Base) MCG/ACT inhaler INHALE 2 PUFFS INTO THE LUNGS EVERY 4 HOURS AS NEEDED FOR WHEEZE OR FOR SHORTNESS OF  BREATH  ? ALPRAZolam (XANAX) 1 MG tablet Daily as needed; may repeat if needed 15/30 days as needed  ? amoxicillin-clavulanate (AUGMENTIN) 875-125 MG tablet Take 1 tablet by mouth 2 (two) times daily.  ? amphetamine-dextroamphetamine (ADDERALL) 30 MG tablet Take 1 tablet by mouth 2 (two) times daily.  ? ascorbic acid (VITAMIN C) 500 MG tablet Take 1,000 mg by mouth daily.   ? Aspirin-Caffeine (BC FAST PAIN RELIEF PO) Take by mouth as needed.  ? Blood Glucose Monitoring Suppl (ONETOUCH VERIO) w/Device KIT To check blood sugar daily for diabetes  ? cetirizine (ZYRTEC) 10 MG tablet Take 1 tablet (10 mg total) by mouth daily.  ? EPINEPHrine 0.3 mg/0.3 mL IJ SOAJ injection Inject into the muscle as needed.  ? hydrochlorothiazide (HYDRODIURIL) 12.5 MG tablet TAKE 1 TABLET BY MOUTH EVERY DAY  ? ibuprofen (ADVIL,MOTRIN) 200 MG tablet Take 4 tablets by mouth as needed.  ? Lancets (ONETOUCH ULTRASOFT) lancets To check blood sugar daily for diabetes  ? levothyroxine (SYNTHROID) 75 MCG tablet Take 1 tablet (75 mcg total) by mouth daily.  ? ONETOUCH VERIO test strip TO CHECK BLOOD SUGAR DAILY FOR DIABETES  ? oxyCODONE-acetaminophen (ENDOCET) 7.5-325 MG tablet Take 1 tablet by mouth 2 (two) times daily.  ? pantoprazole (PROTONIX) 40 MG tablet TAKE 1 TABLET BY MOUTH EVERY DAY  ? predniSONE (DELTASONE) 20 MG tablet Take 1 tablet (20 mg total) by mouth daily with breakfast.  ? propranolol (INDERAL) 20  MG tablet TAKE 1 TABLET BY MOUTH TWICE A DAY  ? rosuvastatin (CRESTOR) 10 MG tablet TAKE 1 TABLET BY MOUTH EVERY DAY  ? sodium chloride (OCEAN) 0.65 % SOLN nasal spray Place 1 spray into both nostrils as needed for congestion.  ? Vitamin D, Ergocalciferol, (DRISDOL) 1.25 MG (50000 UNIT) CAPS capsule Take 1 capsule (50,000 Units total) by mouth every 7 (seven) days.  ? ?No facility-administered encounter medications on file as of 07/05/2021.  ? ? ?Allergies (verified) ?Macrobid [nitrofurantoin macrocrystal], Codeine, Erythromycin,  Hydrocodone-acetaminophen, Lansoprazole, Levofloxacin, Migraine formula  [aspirin-acetaminophen-caffeine], Morphine sulfate, Omeprazole, Pepcid  [famotidine], and Sulfa antibiotics  ? ?History: ?Past Medical History:  ?Diagnosis Date  ? Asthma   ? COPD (chronic obstructive pulmonary disease) ([place])   ? ?Past Surgical History:  ?Procedure Laterality Date  ? ABLATION  2011  ? BREAST REDUCTION SURGERY Bilateral 08/1991  ? CESAREAN SECTION    ? X 2  ? REDUCTION MAMMAPLASTY Bilateral 1990?  ? TUBAL LIGATION  1999  ? ?Family History  ?Problem Relation Age of Onset  ? Lung cancer Father   ? Ulcers Father   ? Cirrhosis Father   ? Colon polyps Maternal Aunt   ? Ulcers Mother   ? Colon polyps Mother   ? Diabetes Mother   ? Hyperlipidemia Mother   ? Aneurysm Maternal Grandmother   ? Throat cancer Maternal Grandfather   ? ?Social History  ? ?Socioeconomic History  ? Marital status: Divorced  ?  Spouse name: Not on file  ? Number of children: 2  ? Years of education: Not on file  ? Highest education level: 11th grade  ?Occupational History  ? Occupation: disability  ?Tobacco Use  ? Smoking status: Every Day  ?  Packs/day: 0.25  ?  Types: Cigarettes  ? Smokeless tobacco: Never  ?Vaping Use  ? Vaping Use: Never used  ?Substance and Sexual Activity  ? Alcohol use: No  ?  Alcohol/week: 0.0 standard drinks  ? Drug use: No  ? Sexual activity: Not on file  ?Other Topics Concern  ? Not on file  ?Social History Narrative  ? Not on file  ? ?Social Determinants of Health  ? ?Financial Resource Strain: Not on file  ?Food Insecurity: No Food Insecurity  ? Worried About Charity fundraiser in the Last Year: Never true  ? Ran Out of Food in the Last Year: Never true  ?Transportation Needs: No Transportation Needs  ? Lack of Transportation (Medical): No  ? Lack of Transportation (Non-Medical): No  ?Physical Activity: Not on file  ?Stress: Not on file  ?Social Connections: Not on file  ? ? ?Tobacco Counseling ?Ready to quit: Not  Answered ?Counseling given: Not Answered ? ? ?Clinical Intake: ? ?Pre-visit preparation completed: Yes ? ?Pain : No/denies pain ? ?  ? ?Nutritional Risks: None ?Diabetes: No ? ?How often do you need to have someone help you when you read instructions, pamphlets, or other written materials from your doctor or pharmacy?: 1 - Never ? ?Diabetic?no, per pt ? ?Interpreter Needed?: No ? ?Information entered by :: Kirke Shaggy, LPN ? ? ?Activities of Daily Living ?   ? View : No data to display.  ?  ?  ?  ? ? ?Patient Care Team: ?[name], FNP as PCP - General (Family Medicine) ?Francee Gentile, MD as Referring Physician (Orthopedic Surgery) ?Neldon Labella, RN as Case Manager ? ?Indicate any recent Medical Services you may have received from other than [facility] providers in  the past year (date may be approximate). ? ?   ?Assessment:  ? This is a routine wellness examination for Ottie. ? ?Hearing/Vision screen ?No results found. ? ?Dietary issues and exercise activities discussed: ?  ? ? Goals Addressed   ?None ?  ? ?Depression Screen ? ?  02/06/2021  ?  1:56 PM 11/27/2020  ?  1:41 PM 06/01/2020  ? 11:24 AM 12/14/2019  ?  2:09 PM 11/25/2018  ?  2:08 PM 11/08/2016  ?  1:42 PM 05/10/2016  ?  8:47 AM  ?PHQ 2/9 Scores  ?PHQ - 2 Score 0 1 3 0 0 0 0  ?PHQ- 9 Score 0  7    5  ?  ?Fall Risk ? ?  11/27/2020  ?  1:41 PM 12/14/2019  ?  2:13 PM 11/25/2018  ?  2:08 PM 11/08/2016  ?  1:42 PM 05/10/2016  ?  8:47 AM  ?Fall Risk   ?Falls in the past year? 0 1 0 No No  ?Comment Reports last fall occured in April 2021      ?Number falls in past yr: 0 0 0    ?Injury with Fall?  1 0    ?Risk for fall due to : Medication side effect;Other (Comment)      ?Risk for fall due to: Comment Frequent edema to right ankle      ?Follow up Falls prevention discussed Falls prevention discussed     ? ? ?FALL RISK PREVENTION PERTAINING TO THE HOME: ? ?Any stairs in or around the home? No  ?If so, are there any without handrails? No  ?Home free of loose throw rugs in  walkways, pet beds, electrical cords, etc? Yes  ?Adequate lighting in your home to reduce risk of falls? Yes  ? ?ASSISTIVE DEVICES UTILIZED TO PREVENT FALLS: ? ?Life alert? No  ?Use of a cane, walker or w/c? No  ?Grab bars in th

## 2021-07-13 ENCOUNTER — Other Ambulatory Visit: Payer: Self-pay | Admitting: Family Medicine

## 2021-07-13 DIAGNOSIS — F419 Anxiety disorder, unspecified: Secondary | ICD-10-CM

## 2021-07-13 DIAGNOSIS — M778 Other enthesopathies, not elsewhere classified: Secondary | ICD-10-CM

## 2021-07-13 NOTE — Telephone Encounter (Signed)
Requested medications are due for refill today.  yes ? ?Requested medications are on the active medications list.  yes ? ?Last refill. Endocet 06/08/2021 #60 0 refills, Xanax 06/14/2021 #45 ? ?Future visit scheduled.   no ? ?Notes to clinic.  Medication refills are not delegated. ? ? ? ?Requested Prescriptions  ?Pending Prescriptions Disp Refills  ? oxyCODONE-acetaminophen (ENDOCET) 7.5-325 MG tablet 60 tablet 0  ?  Sig: Take 1 tablet by mouth 2 (two) times daily.  ?  ? Not Delegated - Analgesics:  Opioid Agonist Combinations Failed - 07/13/2021  3:12 PM  ?  ?  Failed - This refill cannot be delegated  ?  ?  Failed - Urine Drug Screen completed in last 360 days  ?  ?  Passed - Valid encounter within last 3 months  ?  Recent Outpatient Visits   ? ?      ? 4 weeks ago Neck pain, chronic  ? [facility] Tally Joe T, FNP  ? 1 month ago Chronic pansinusitis  ? [facility] Tally Joe T, FNP  ? 4 months ago Chronic pansinusitis  ? [facility] [name], FNP  ? 5 months ago Chronic pansinusitis  ? [facility] [name], FNP  ? 7 months ago Intractable migraine without aura and with status migrainosus  ? [facility] Tally Joe T, FNP  ? ?  ?  ? ? ?  ?  ?  ? ALPRAZolam (XANAX) 1 MG tablet 45 tablet 0  ?  Sig: Daily as needed; may repeat if needed 15/30 days as needed  ?  ? Not Delegated - Psychiatry: Anxiolytics/Hypnotics 2 Failed - 07/13/2021  3:12 PM  ?  ?  Failed - This refill cannot be delegated  ?  ?  Failed - Urine Drug Screen completed in last 360 days  ?  ?  Passed - Patient is not pregnant  ?  ?  Passed - Valid encounter within last 6 months  ?  Recent Outpatient Visits   ? ?      ? 4 weeks ago Neck pain, chronic  ? [facility] Tally Joe T, FNP  ? 1 month ago Chronic pansinusitis  ? [facility] Tally Joe T, FNP  ? 4 months ago Chronic pansinusitis  ? [facility] [name], FNP  ?  5 months ago Chronic pansinusitis  ? [facility] [name], FNP  ? 7 months ago Intractable migraine without aura and with status migrainosus  ? [facility] Tally Joe T, FNP  ? ?  ?  ? ? ?  ?  ?  ?  ?

## 2021-07-13 NOTE — Telephone Encounter (Signed)
Medication Refill - Medication: ALPRAZolam (XANAX) 1 MG tablet and oxyCODONE-acetaminophen (ENDOCET) 7.5-325 MG tablet ? ?Has the patient contacted their pharmacy? Yes.   Pt told to contact provider ? ?Preferred Pharmacy (with phone number or street name):  ?[id number] [place]:  [phone] ?Fax:  [phone] ?  ? ?Has the patient been seen for an appointment in the last year OR does the patient have an upcoming appointment? Yes.   ? ?Agent: Please be advised that RX refills may take up to 3 business days. We ask that you follow-up with your pharmacy. ? ? ?

## 2021-07-16 MED ORDER — OXYCODONE-ACETAMINOPHEN 7.5-325 MG PO TABS: 1.0000 | ORAL_TABLET | Freq: Two times a day (BID) | ORAL | 0 refills | Status: DC

## 2021-07-16 MED ORDER — ALPRAZOLAM 1 MG PO TABS: ORAL_TABLET | ORAL | 0 refills | Status: DC

## 2021-07-19 ENCOUNTER — Other Ambulatory Visit: Payer: Self-pay | Admitting: Family Medicine

## 2021-07-19 DIAGNOSIS — F900 Attention-deficit hyperactivity disorder, predominantly inattentive type: Secondary | ICD-10-CM

## 2021-07-19 NOTE — Telephone Encounter (Signed)
Medication Refill - Medication: amphetamine-dextroamphetamine (ADDERALL) 30 MG tablet ? ?Has the patient contacted their pharmacy? Yes.   ?(Agent: If no, request that the patient contact the pharmacy for the refill. If patient does not wish to contact the pharmacy document the reason why and proceed with request.) ?(Agent: If yes, when and what did the pharmacy advise?) ? ?Preferred Pharmacy (with phone number or street name):  ?[id number] [place] [id number] ?Phone: [phone]: [phone] ? ?Has the patient been seen for an appointment in the last year OR does the patient have an upcoming appointment? Yes.   ? ?Agent: Please be advised that RX refills may take up to 3 business days. We ask that you follow-up with your pharmacy. ? ?

## 2021-07-20 MED ORDER — AMPHETAMINE-DEXTROAMPHETAMINE 30 MG PO TABS: 1.0000 | ORAL_TABLET | Freq: Two times a day (BID) | ORAL | 0 refills | Status: DC

## 2021-07-20 NOTE — Telephone Encounter (Signed)
Requested medication (s) are due for refill today - yes ? ?Requested medication (s) are on the active medication list -yes ? ?Future visit scheduled -yes ? ?Last refill: 06/14/21 #60 ? ?Notes to clinic: non delegated Rx ? ?Requested Prescriptions  ?Pending Prescriptions Disp Refills  ? amphetamine-dextroamphetamine (ADDERALL) 30 MG tablet 60 tablet 0  ?  Sig: Take 1 tablet by mouth 2 (two) times daily.  ?  ? Not Delegated - Psychiatry:  Stimulants/ADHD Failed - 07/19/2021  5:08 PM  ?  ?  Failed - This refill cannot be delegated  ?  ?  Failed - Urine Drug Screen completed in last 360 days  ?  ?  Passed - Last BP in normal range  ?  BP Readings from Last 1 Encounters:  ?06/01/21 (!) 110/51  ?   ?  ?  Passed - Last Heart Rate in normal range  ?  Pulse Readings from Last 1 Encounters:  ?06/01/21 72  ?   ?  ?  Passed - Valid encounter within last 6 months  ?  Recent Outpatient Visits   ? ?      ? 1 month ago Neck pain, chronic  ? [facility] Tally Joe T, FNP  ? 1 month ago Chronic pansinusitis  ? [facility] Tally Joe T, FNP  ? 4 months ago Chronic pansinusitis  ? [facility] [name], FNP  ? 5 months ago Chronic pansinusitis  ? [facility] [name], FNP  ? 7 months ago Intractable migraine without aura and with status migrainosus  ? [facility] Tally Joe T, FNP  ? ?  ?  ? ? ?  ?  ?  ? ? ? ?Requested Prescriptions  ?Pending Prescriptions Disp Refills  ? amphetamine-dextroamphetamine (ADDERALL) 30 MG tablet 60 tablet 0  ?  Sig: Take 1 tablet by mouth 2 (two) times daily.  ?  ? Not Delegated - Psychiatry:  Stimulants/ADHD Failed - 07/19/2021  5:08 PM  ?  ?  Failed - This refill cannot be delegated  ?  ?  Failed - Urine Drug Screen completed in last 360 days  ?  ?  Passed - Last BP in normal range  ?  BP Readings from Last 1 Encounters:  ?06/01/21 (!) 110/51  ?   ?  ?  Passed - Last Heart Rate in normal range  ?  Pulse Readings from  Last 1 Encounters:  ?06/01/21 72  ?   ?  ?  Passed - Valid encounter within last 6 months  ?  Recent Outpatient Visits   ? ?      ? 1 month ago Neck pain, chronic  ? [facility] Tally Joe T, FNP  ? 1 month ago Chronic pansinusitis  ? [facility] Tally Joe T, FNP  ? 4 months ago Chronic pansinusitis  ? [facility] [name], FNP  ? 5 months ago Chronic pansinusitis  ? [facility] [name], FNP  ? 7 months ago Intractable migraine without aura and with status migrainosus  ? [facility] Tally Joe T, FNP  ? ?  ?  ? ? ?  ?  ?  ? ? ? ?

## 2021-07-31 ENCOUNTER — Other Ambulatory Visit: Payer: Self-pay | Admitting: Family Medicine

## 2021-08-16 ENCOUNTER — Other Ambulatory Visit: Payer: Self-pay | Admitting: Family Medicine

## 2021-08-16 DIAGNOSIS — M778 Other enthesopathies, not elsewhere classified: Secondary | ICD-10-CM

## 2021-08-16 DIAGNOSIS — F419 Anxiety disorder, unspecified: Secondary | ICD-10-CM

## 2021-08-16 MED ORDER — OXYCODONE-ACETAMINOPHEN 7.5-325 MG PO TABS: 1.0000 | ORAL_TABLET | Freq: Two times a day (BID) | ORAL | 0 refills | Status: DC

## 2021-08-16 MED ORDER — ALPRAZOLAM 1 MG PO TABS: ORAL_TABLET | ORAL | 0 refills | Status: DC

## 2021-08-16 NOTE — Telephone Encounter (Signed)
Requested medication (s) are due for refill today: yes  Requested medication (s) are on the active medication list: yes  Last refill:  xanax: 07/16/21 #45           oxycodone-acetaminophen:07/16/21 #60  Future visit scheduled: no  Notes to clinic:  med not delegated to NT to RF   Requested Prescriptions  Pending Prescriptions Disp Refills   ALPRAZolam (XANAX) 1 MG tablet 45 tablet 0    Sig: Daily as needed; may repeat if needed 15/30 days as needed     Not Delegated - Psychiatry: Anxiolytics/Hypnotics 2 Failed - 08/16/2021 10:28 AM      Failed - This refill cannot be delegated      Failed - Urine Drug Screen completed in last 360 days      Passed - Patient is not pregnant      Passed - Valid encounter within last 6 months    Recent Outpatient Visits           2 months ago Neck pain, chronic   [facility] Tally Joe T, FNP   2 months ago Chronic pansinusitis   [facility] Tally Joe T, FNP   5 months ago Chronic pansinusitis   [facility] Tally Joe T, FNP   6 months ago Chronic pansinusitis   [facility] Tally Joe T, FNP   8 months ago Intractable migraine without aura and with status migrainosus   [facility] Tally Joe T, FNP               oxyCODONE-acetaminophen (ENDOCET) 7.5-325 MG tablet 60 tablet 0    Sig: Take 1 tablet by mouth 2 (two) times daily.     Not Delegated - Analgesics:  Opioid Agonist Combinations Failed - 08/16/2021 10:28 AM      Failed - This refill cannot be delegated      Failed - Urine Drug Screen completed in last 360 days      Passed - Valid encounter within last 3 months    Recent Outpatient Visits           2 months ago Neck pain, chronic   [facility] Tally Joe T, FNP   2 months ago Chronic pansinusitis    [facility] [name], FNP   5 months ago Chronic pansinusitis   [facility] [name],  FNP   6 months ago Chronic pansinusitis   [facility] Tally Joe T, FNP   8 months ago Intractable migraine without aura and with status migrainosus   [facility] [name], FNP

## 2021-08-16 NOTE — Telephone Encounter (Signed)
Medication Refill - Medication:  oxyCODONE-acetaminophen (ENDOCET) 7.5-325 MG tablet  ALPRAZolam (XANAX) 1 MG tablet   Has the patient contacted their pharmacy? Yes.   Contact PCP  Preferred Pharmacy (with phone number or street name):  [id number] [place] [id number] Phone:  [phone] Fax:  [phone]  Has the patient been seen for an appointment in the last year OR does the patient have an upcoming appointment? Yes.    Agent: Please be advised that RX refills may take up to 3 business days. We ask that you follow-up with your pharmacy.

## 2021-08-17 MED ORDER — OXYCODONE-ACETAMINOPHEN 7.5-325 MG PO TABS: 1.0000 | ORAL_TABLET | Freq: Two times a day (BID) | ORAL | 0 refills | Status: DC

## 2021-08-17 NOTE — Telephone Encounter (Signed)
[facility] oxyCODONE-acetaminophen (ENDOCET) 7.5-325 MG tablet in stock so pt needs refill sent to  [place], [place] Phone:  [phone]  Fax:  [phone]

## 2021-08-17 NOTE — Addendum Note (Signed)
Addended by: Randal Buba on: 08/17/2021 02:08 PM   Modules accepted: Orders

## 2021-08-17 NOTE — Telephone Encounter (Signed)
[facility] do not [facility] this medication and is back order. Don't think they will have it any time soon.

## 2021-08-22 ENCOUNTER — Other Ambulatory Visit: Payer: Self-pay | Admitting: Family Medicine

## 2021-08-22 DIAGNOSIS — F900 Attention-deficit hyperactivity disorder, predominantly inattentive type: Secondary | ICD-10-CM

## 2021-08-22 DIAGNOSIS — E039 Hypothyroidism, unspecified: Secondary | ICD-10-CM

## 2021-08-22 DIAGNOSIS — G43011 Migraine without aura, intractable, with status migrainosus: Secondary | ICD-10-CM

## 2021-08-22 MED ORDER — AMPHETAMINE-DEXTROAMPHETAMINE 30 MG PO TABS: 1.0000 | ORAL_TABLET | Freq: Two times a day (BID) | ORAL | 0 refills | Status: DC

## 2021-08-22 NOTE — Telephone Encounter (Signed)
Requested Prescriptions  Pending Prescriptions Disp Refills  . propranolol (INDERAL) 20 MG tablet [Pharmacy Med Name: PROPRANOLOL 20 MG TABLET] 180 tablet 0    Sig: TAKE 1 TABLET BY MOUTH TWICE A DAY     Cardiovascular:  Beta Blockers Passed - 08/22/2021  3:27 PM      Passed - Last BP in normal range    BP Readings from Last 1 Encounters:  06/01/21 (!) 110/51         Passed - Last Heart Rate in normal range    Pulse Readings from Last 1 Encounters:  06/01/21 72         Passed - Valid encounter within last 6 months    Recent Outpatient Visits          2 months ago Neck pain, chronic   [facility] Tally Joe T, FNP   2 months ago Chronic pansinusitis   [facility] [name], FNP   5 months ago Chronic pansinusitis   [facility] [name], FNP   6 months ago Chronic pansinusitis   [facility] Tally Joe T, FNP   9 months ago Intractable migraine without aura and with status migrainosus   [facility] Tally Joe T, FNP             . levothyroxine (SYNTHROID) 75 MCG tablet [Pharmacy Med Name: LEVOTHYROXINE 75 MCG TABLET] 90 tablet 0    Sig: TAKE [place]     Endocrinology:  Hypothyroid Agents Failed - 08/22/2021  3:27 PM      Failed - TSH in normal range and within 360 days    TSH  Date Value Ref Range Status  06/01/2021 9.220 (H) 0.450 - 4.500 uIU/mL Final         Passed - Valid encounter within last 12 months    Recent Outpatient Visits          2 months ago Neck pain, chronic   [facility] Tally Joe T, FNP   2 months ago Chronic pansinusitis   [facility] [name], FNP   5 months ago Chronic pansinusitis   [facility] [name], FNP   6 months ago Chronic pansinusitis   [facility] Tally Joe T, FNP   9 months ago Intractable migraine without aura and with status migrainosus    [facility] [name], FNP

## 2021-08-22 NOTE — Telephone Encounter (Signed)
Medication Refill - Medication: amphetamine-dextroamphetamine (ADDERALL) 30 MG tablet  Has the patient contacted their pharmacy? Yes.   (Agent: If no, request that the patient contact the pharmacy for the refill. If patient does not wish to contact the pharmacy document the reason why and proceed with request.) (Agent: If yes, when and what did the pharmacy advise?)  Preferred Pharmacy (with phone number or street name):  [place], [place] [address]  [place] [address]  Phone: [phone] Fax: [phone]   Has the patient been seen for an appointment in the last year OR does the patient have an upcoming appointment? Yes.    Agent: Please be advised that RX refills may take up to 3 business days. We ask that you follow-up with your pharmacy.

## 2021-08-22 NOTE — Telephone Encounter (Signed)
Requested medication (s) are due for refill today: yes  Requested medication (s) are on the active medication list: yes  Last refill:  07/20/21 #60/0  Future visit scheduled: no  Notes to clinic:  Unable to refill per protocol, cannot delegate.    Requested Prescriptions  Pending Prescriptions Disp Refills   amphetamine-dextroamphetamine (ADDERALL) 30 MG tablet 60 tablet 0    Sig: Take 1 tablet by mouth 2 (two) times daily.     Not Delegated - Psychiatry:  Stimulants/ADHD Failed - 08/22/2021 12:00 PM      Failed - This refill cannot be delegated      Failed - Urine Drug Screen completed in last 360 days      Passed - Last BP in normal range    BP Readings from Last 1 Encounters:  06/01/21 (!) 110/51         Passed - Last Heart Rate in normal range    Pulse Readings from Last 1 Encounters:  06/01/21 72         Passed - Valid encounter within last 6 months    Recent Outpatient Visits           2 months ago Neck pain, chronic   [facility] Tally Joe T, FNP   2 months ago Chronic pansinusitis   [facility] [name], FNP   5 months ago Chronic pansinusitis   [facility] [name], FNP   6 months ago Chronic pansinusitis   [facility] Tally Joe T, FNP   9 months ago Intractable migraine without aura and with status migrainosus   [facility] [name], FNP

## 2021-08-30 ENCOUNTER — Telehealth: Payer: Self-pay

## 2021-08-30 NOTE — Progress Notes (Signed)
Called patient. LMTCB. Okay for PEC to advise.

## 2021-08-30 NOTE — Telephone Encounter (Signed)
Called patient, LMTCB. Okay for PEC to advise.

## 2021-08-30 NOTE — Telephone Encounter (Signed)
Pt stated she has the box but will take a few days to complete due to not having BM daily.

## 2021-08-30 NOTE — Telephone Encounter (Signed)
-----   Message from [name], FNP sent at 08/30/2021  7:55 AM EDT ----- Please call patient and encourage completion for colon cancer screening.  Thanks,  [name], [address] [place], [place] [address] [phone] (phone) [phone] (fax) [place]  ----- Message ----- From: SYSTEM Sent: 08/30/2021  12:16 AM EDT To: [name], FNP

## 2021-09-18 ENCOUNTER — Other Ambulatory Visit: Payer: Self-pay | Admitting: Family Medicine

## 2021-09-18 DIAGNOSIS — M778 Other enthesopathies, not elsewhere classified: Secondary | ICD-10-CM

## 2021-09-18 NOTE — Telephone Encounter (Signed)
Medication Refill - Medication:oxyCODONE-acetaminophen (ENDOCET) 7.5-325 MG tablet/ALPRAZolam (XANAX) 1 MG tablet   Has the patient contacted their pharmacy? no because of these meds she's told to cal in (Agent: If no, request that the patient contact the pharmacy for the refill. If patient does not wish to contact the pharmacy document the reason why and proceed with request.) (Agent: If yes, when and what did the pharmacy advise?)contact pcp  Preferred Pharmacy (with phone number or street name):[place], [place]  Phone:  [phone] Fax:  [phone] Has the patient been seen for an appointment in the last year OR does the patient have an upcoming appointment? yes  Agent: Please be advised that RX refills may take up to 3 business days. We ask that you follow-up with your pharmacy.

## 2021-09-19 MED ORDER — OXYCODONE-ACETAMINOPHEN 7.5-325 MG PO TABS: 1.0000 | ORAL_TABLET | Freq: Two times a day (BID) | ORAL | 0 refills | Status: DC

## 2021-09-19 NOTE — Telephone Encounter (Signed)
Requested medication (s) are due for refill today - yes  Requested medication (s) are on the active medication list -yes  Future visit scheduled -no  Last refill: 08/17/21 #60  Notes to clinic: non delegated Rx  Requested Prescriptions  Pending Prescriptions Disp Refills   oxyCODONE-acetaminophen (ENDOCET) 7.5-325 MG tablet 60 tablet 0    Sig: Take 1 tablet by mouth 2 (two) times daily.     Not Delegated - Analgesics:  Opioid Agonist Combinations Failed - 09/18/2021  3:01 PM      Failed - This refill cannot be delegated      Failed - Urine Drug Screen completed in last 360 days      Passed - Valid encounter within last 3 months    Recent Outpatient Visits           3 months ago Neck pain, chronic   [facility] Tally Joe T, FNP   3 months ago Chronic pansinusitis   [facility] [name], FNP   6 months ago Chronic pansinusitis   [facility] [name], FNP   7 months ago Chronic pansinusitis   [facility] Tally Joe T, FNP   10 months ago Intractable migraine without aura and with status migrainosus   [facility] Tally Joe T, FNP                 Requested Prescriptions  Pending Prescriptions Disp Refills   oxyCODONE-acetaminophen (ENDOCET) 7.5-325 MG tablet 60 tablet 0    Sig: Take 1 tablet by mouth 2 (two) times daily.     Not Delegated - Analgesics:  Opioid Agonist Combinations Failed - 09/18/2021  3:01 PM      Failed - This refill cannot be delegated      Failed - Urine Drug Screen completed in last 360 days      Passed - Valid encounter within last 3 months    Recent Outpatient Visits           3 months ago Neck pain, chronic   [facility] Tally Joe T, FNP   3 months ago Chronic pansinusitis   [facility] [name], FNP   6 months ago Chronic pansinusitis   [facility] [name], FNP   7 months ago  Chronic pansinusitis   [facility] Tally Joe T, FNP   10 months ago Intractable migraine without aura and with status migrainosus   [facility] [name], FNP

## 2021-09-20 ENCOUNTER — Telehealth: Payer: Self-pay | Admitting: Family Medicine

## 2021-09-20 DIAGNOSIS — F900 Attention-deficit hyperactivity disorder, predominantly inattentive type: Secondary | ICD-10-CM

## 2021-09-20 DIAGNOSIS — F419 Anxiety disorder, unspecified: Secondary | ICD-10-CM

## 2021-09-20 NOTE — Telephone Encounter (Signed)
Medication Refill - Medication:   Generic Xanax 1 mg. Adderall 30 mg  Has the patient contacted their pharmacy? No  she says the pharmacy always tells her to call the office.  Preferred Pharmacy (with phone number or street name): [place]  Has the patient been seen for an appointment in the last year OR does the patient have an upcoming appointment? Yes.    Agent: Please be advised that RX refills may take up to 3 business days. We ask that you follow-up with your pharmacy.

## 2021-09-20 NOTE — Telephone Encounter (Signed)
Requested medication (s) are due for refill today: yes  Requested medication (s) are on the active medication list: yes  Last refill:  Xanax 08/16/21 #45/0, Adderrall 08/22/21 #60/0  Future visit scheduled: no  Notes to clinic:  Unable to refill per protocol, cannot delegate.    Requested Prescriptions  Pending Prescriptions Disp Refills   ALPRAZolam (XANAX) 1 MG tablet 45 tablet 0    Sig: Daily as needed; may repeat if needed 15/30 days as needed     Not Delegated - Psychiatry: Anxiolytics/Hypnotics 2 Failed - 09/20/2021  5:17 PM      Failed - This refill cannot be delegated      Failed - Urine Drug Screen completed in last 360 days      Passed - Patient is not pregnant      Passed - Valid encounter within last 6 months    Recent Outpatient Visits           3 months ago Neck pain, chronic   [facility] Tally Joe T, FNP   3 months ago Chronic pansinusitis   [facility] [name], FNP   6 months ago Chronic pansinusitis   [facility] Tally Joe T, FNP   7 months ago Chronic pansinusitis   [facility] Tally Joe T, FNP   10 months ago Intractable migraine without aura and with status migrainosus   [facility] Tally Joe T, FNP               amphetamine-dextroamphetamine (ADDERALL) 30 MG tablet 60 tablet 0    Sig: Take 1 tablet by mouth 2 (two) times daily.     Not Delegated - Psychiatry:  Stimulants/ADHD Failed - 09/20/2021  5:17 PM      Failed - This refill cannot be delegated      Failed - Urine Drug Screen completed in last 360 days      Passed - Last BP in normal range    BP Readings from Last 1 Encounters:  06/01/21 (!) 110/51         Passed - Last Heart Rate in normal range    Pulse Readings from Last 1 Encounters:  06/01/21 72         Passed - Valid encounter within last 6 months    Recent Outpatient Visits           3 months ago Neck pain, chronic   [facility] Tally Joe T, FNP   3 months ago Chronic pansinusitis   [facility] [name], FNP   6 months ago Chronic pansinusitis   [facility] [name], FNP   7 months ago Chronic pansinusitis   [facility] Tally Joe T, FNP   10 months ago Intractable migraine without aura and with status migrainosus   [facility] [name], FNP

## 2021-09-21 MED ORDER — ALPRAZOLAM 1 MG PO TABS: ORAL_TABLET | ORAL | 0 refills | Status: DC

## 2021-09-21 MED ORDER — AMPHETAMINE-DEXTROAMPHETAMINE 30 MG PO TABS: 1.0000 | ORAL_TABLET | Freq: Two times a day (BID) | ORAL | 0 refills | Status: DC

## 2021-09-26 ENCOUNTER — Telehealth: Payer: Self-pay

## 2021-09-26 NOTE — Telephone Encounter (Signed)
  Care Management   Follow Up Note   09/26/2021 Name: Kathleen Buchanan MRN: [id number] DOB: 1965-10-27   Primary Care Provider: [name], FNP Reason for referral : Care Coordination   An unsuccessful telephone outreach was attempted today. The patient was referred to the case management team for assistance with care management and care coordination.    Follow Up Plan:  A HIPAA compliant voice message was left today requesting a return call.   [facility] [phone]

## 2021-09-27 ENCOUNTER — Ambulatory Visit: Payer: Self-pay

## 2021-09-27 ENCOUNTER — Other Ambulatory Visit: Payer: Self-pay | Admitting: Family Medicine

## 2021-09-27 DIAGNOSIS — E7800 Pure hypercholesterolemia, unspecified: Secondary | ICD-10-CM

## 2021-09-27 DIAGNOSIS — J324 Chronic pansinusitis: Secondary | ICD-10-CM

## 2021-09-27 DIAGNOSIS — F419 Anxiety disorder, unspecified: Secondary | ICD-10-CM

## 2021-09-27 MED ORDER — ALPRAZOLAM 1 MG PO TABS: ORAL_TABLET | ORAL | 0 refills | Status: DC

## 2021-09-27 NOTE — Telephone Encounter (Signed)
Patient states pharmacy is denying refilling ALPRAZolam (XANAX) 1 MG tablet due to PCP instructions. Patient states it has been more then 30 days since last fill and would like a follow up call from PCP nurse.   [place], [place] [address]  [place], [place] [address]  Phone:  [phone]  Fax:  [phone]

## 2021-09-29 DIAGNOSIS — H6692 Otitis media, unspecified, left ear: Secondary | ICD-10-CM | POA: Diagnosis not present

## 2021-09-29 DIAGNOSIS — I889 Nonspecific lymphadenitis, unspecified: Secondary | ICD-10-CM | POA: Diagnosis not present

## 2021-10-01 ENCOUNTER — Encounter: Payer: Self-pay | Admitting: Family Medicine

## 2021-10-01 ENCOUNTER — Telehealth (INDEPENDENT_AMBULATORY_CARE_PROVIDER_SITE_OTHER): Payer: Medicare Other | Admitting: Family Medicine

## 2021-10-01 VITALS — Ht 62.0 in | Wt 212.0 lb

## 2021-10-01 DIAGNOSIS — J324 Chronic pansinusitis: Secondary | ICD-10-CM | POA: Diagnosis not present

## 2021-10-01 DIAGNOSIS — G8929 Other chronic pain: Secondary | ICD-10-CM

## 2021-10-01 DIAGNOSIS — M542 Cervicalgia: Secondary | ICD-10-CM

## 2021-10-01 NOTE — Progress Notes (Unsigned)
MyChart Video Visit  I,Joseline E Rosas,acting as a scribe for [name], FNP.,have documented all relevant documentation on the behalf of [name], FNP,as directed by  [name], FNP while in the presence of [name], FNP.    Virtual Visit via Video Note   This visit type was conducted due to national recommendations for restrictions regarding the COVID-19 Pandemic (e.g. social distancing) in an effort to limit this patient's exposure and mitigate transmission in our community. This patient is at least at moderate risk for complications without adequate follow up. This format is felt to be most appropriate for this patient at this time. Physical exam was limited by quality of the video and audio technology used for the visit.   Patient location: home, couch Provider location: [facility] [address]  [address] [place], [place] [address]   I discussed the limitations of evaluation and management by telemedicine and the availability of in person appointments. The patient expressed understanding and agreed to proceed.  Patient: Kathleen Buchanan   DOB: 07-Jan-1966   56 y.o. Female  MRN: [id number] Visit Date: 10/01/2021  Today's healthcare provider: [name], FNP   Chief Complaint  Patient presents with   URI   Subjective    URI  This is a new problem. The current episode started in the past 7 days. The problem has been unchanged. There has been no fever. Associated symptoms include congestion, coughing, headaches, neck pain, a plugged ear sensation (Left ear-Fluid), sinus pain, a sore throat and swollen glands. Pertinent negatives include no ear pain, rhinorrhea or wheezing. Associated symptoms comments: Sinus pressure. Treatments tried: On Augmentin. The treatment provided no relief.  [facility]ent to [facility] to urgent Care on Saturday 09/29/2021 and prescribed Augmentin 875 mg BID.     Medications: Outpatient Medications Prior to Visit  Medication  Sig   acetaminophen (TYLENOL) 500 MG tablet Take 1,000 mg by mouth every 6 (six) hours as needed.    albuterol (VENTOLIN HFA) 108 (90 Base) MCG/ACT inhaler INHALE 2 PUFFS INTO THE LUNGS EVERY 4 HOURS AS NEEDED FOR WHEEZE OR FOR SHORTNESS OF BREATH   ALPRAZolam (XANAX) 1 MG tablet Use orally as needed for anxiety.   amphetamine-dextroamphetamine (ADDERALL) 30 MG tablet Take 1 tablet by mouth 2 (two) times daily.   ascorbic acid (VITAMIN C) 500 MG tablet Take 1,000 mg by mouth daily.    Aspirin-Caffeine (BC FAST PAIN RELIEF PO) Take by mouth as needed.   Blood Glucose Monitoring Suppl (ONETOUCH VERIO) w/Device KIT To check blood sugar daily for diabetes   cetirizine (ZYRTEC) 10 MG tablet Take 1 tablet (10 mg total) by mouth daily.   EPINEPHrine 0.3 mg/0.3 mL IJ SOAJ injection Inject into the muscle as needed.   hydrochlorothiazide (HYDRODIURIL) 12.5 MG tablet TAKE 1 TABLET BY MOUTH EVERY DAY   ibuprofen (ADVIL,MOTRIN) 200 MG tablet Take 4 tablets by mouth as needed.   Lancets (ONETOUCH ULTRASOFT) lancets To check blood sugar daily for diabetes   levothyroxine (SYNTHROID) 75 MCG tablet TAKE 1 TABLET BY MOUTH EVERY DAY   ONETOUCH VERIO test strip TO CHECK BLOOD SUGAR DAILY FOR DIABETES   oxyCODONE-acetaminophen (ENDOCET) 7.5-325 MG tablet Take 1 tablet by mouth 2 (two) times daily.   pantoprazole (PROTONIX) 40 MG tablet TAKE 1 TABLET BY MOUTH EVERY DAY   propranolol (INDERAL) 20 MG tablet TAKE 1 TABLET BY MOUTH TWICE A DAY   rosuvastatin (CRESTOR) 10 MG tablet TAKE 1 TABLET  BY MOUTH EVERY DAY   sodium chloride (OCEAN) 0.65 % SOLN nasal spray Place 1 spray into both nostrils as needed for congestion.   Vitamin D, Ergocalciferol, (DRISDOL) 1.25 MG (50000 UNIT) CAPS capsule Take 1 capsule (50,000 Units total) by mouth every 7 (seven) days.   No facility-administered medications prior to visit.    Review of Systems  HENT:  Positive for congestion, sinus pain and sore throat. Negative for ear pain  and rhinorrhea.   Respiratory:  Positive for cough. Negative for wheezing.   Musculoskeletal:  Positive for neck pain.  Neurological:  Positive for headaches.      Objective    Ht '5\' 2"'  (1.575 m)   Wt 212 lb (96.2 kg) Comment: per patient  BMI 38.78 kg/m      Physical Exam Constitutional:      Appearance: Normal appearance.  HENT:     Head: Normocephalic and atraumatic.  Pulmonary:     Effort: Pulmonary effort is normal.  Skin:    General: Skin is dry.  Neurological:     Mental Status: She is alert.  Psychiatric:        Mood and Affect: Mood normal.        Behavior: Behavior normal.        Thought Content: Thought content normal.        Judgment: Judgment normal.        Assessment & Plan     Problem List Items Addressed This Visit       Respiratory   Chronic pansinusitis - Primary    Chronic, recent exacerbation Was seen at [facility] and re-started on Abx Offered use of prednisone to assist, pt deferred Denies sick contacts Denies travel Referral already placed to ENT Pt will report back/follow up with ENT based on improvement         Other   Neck pain, chronic    Chronic, exacerbated by sinusitis with ear involvement Declines use of prednisone Referral to ENT pending         Return if symptoms worsen or fail to improve.     I discussed the assessment and treatment plan with the patient. The patient was provided an opportunity to ask questions and all were answered. The patient agreed with the plan and demonstrated an understanding of the instructions.   The patient was advised to call back or seek an in-person evaluation if the symptoms worsen or if the condition fails to improve as anticipated.  I provided 10 minutes of face-to-face time during this encounter discussing current symptoms, next steps, referral process.  Vonna Kotyk, FNP, have reviewed all documentation for this visit. The documentation on 10/02/21 for the exam, diagnosis,  procedures, and orders are all accurate and complete.   [name], [place] [phone] (phone) [phone] (fax)  [place]

## 2021-10-02 ENCOUNTER — Encounter: Payer: Self-pay | Admitting: Family Medicine

## 2021-10-02 NOTE — Assessment & Plan Note (Signed)
Chronic, recent exacerbation Was seen at [facility] and re-started on Abx Offered use of prednisone to assist, pt deferred Denies sick contacts Denies travel Referral already placed to ENT Pt will report back/follow up with ENT based on improvement

## 2021-10-02 NOTE — Assessment & Plan Note (Signed)
Chronic, exacerbated by sinusitis with ear involvement Declines use of prednisone Referral to ENT pending

## 2021-10-09 NOTE — Chronic Care Management (AMB) (Signed)
   10/09/2021  Kathleen Buchanan 06-02-1965 [id number]  Documentation encounter for case transition. The care team will continue to follow for care coordination as needed.   [facility] [phone]

## 2021-10-24 ENCOUNTER — Other Ambulatory Visit: Payer: Self-pay | Admitting: Family Medicine

## 2021-10-24 DIAGNOSIS — M778 Other enthesopathies, not elsewhere classified: Secondary | ICD-10-CM

## 2021-10-24 DIAGNOSIS — F900 Attention-deficit hyperactivity disorder, predominantly inattentive type: Secondary | ICD-10-CM

## 2021-10-24 MED ORDER — OXYCODONE-ACETAMINOPHEN 7.5-325 MG PO TABS: 1.0000 | ORAL_TABLET | Freq: Two times a day (BID) | ORAL | 0 refills | Status: DC

## 2021-10-24 MED ORDER — AMPHETAMINE-DEXTROAMPHETAMINE 30 MG PO TABS
1.0000 | ORAL_TABLET | Freq: Two times a day (BID) | ORAL | 0 refills | Status: DC
Start: 2021-10-24 — End: 2021-11-26

## 2021-10-24 NOTE — Telephone Encounter (Signed)
Medication Refill - Medication: amphetamine-dextroamphetamine (ADDERALL) 30 MG tablet and oxyCODONE-acetaminophen (ENDOCET) 7.5-325 MG tablet  Has the patient contacted their pharmacy? No.  Preferred Pharmacy (with phone number or street name):  [place], [place] Phone:  [phone]  Fax:  [phone]     Has the patient been seen for an appointment in the last year OR does the patient have an upcoming appointment? Yes.    Agent: Please be advised that RX refills may take up to 3 business days. We ask that you follow-up with your pharmacy.

## 2021-10-24 NOTE — Telephone Encounter (Signed)
Requested medication (s) are due for refill today: yes  Requested medication (s) are on the active medication list: yes  Last refill:  09/21/21 and 09/19/21 for 30 days  Future visit scheduled: yes  Notes to clinic:  Unable to refill per protocol, cannot delegate.      Requested Prescriptions  Pending Prescriptions Disp Refills   amphetamine-dextroamphetamine (ADDERALL) 30 MG tablet 60 tablet 0    Sig: Take 1 tablet by mouth 2 (two) times daily.     Not Delegated - Psychiatry:  Stimulants/ADHD Failed - 10/24/2021 10:49 AM      Failed - This refill cannot be delegated      Failed - Urine Drug Screen completed in last 360 days      Passed - Last BP in normal range    BP Readings from Last 1 Encounters:  06/01/21 (!) 110/51         Passed - Last Heart Rate in normal range    Pulse Readings from Last 1 Encounters:  06/01/21 72         Passed - Valid encounter within last 6 months    Recent Outpatient Visits           3 weeks ago Chronic pansinusitis   [facility] [name], FNP   4 months ago Neck pain, chronic   [facility] Tally Joe T, FNP   4 months ago Chronic pansinusitis   [facility] [name], FNP   7 months ago Chronic pansinusitis   [facility] Tally Joe T, FNP   8 months ago Chronic pansinusitis   [facility] Tally Joe T, FNP               oxyCODONE-acetaminophen (ENDOCET) 7.5-325 MG tablet 60 tablet 0    Sig: Take 1 tablet by mouth 2 (two) times daily.     Not Delegated - Analgesics:  Opioid Agonist Combinations Failed - 10/24/2021 10:49 AM      Failed - This refill cannot be delegated      Failed - Urine Drug Screen completed in last 360 days      Passed - Valid encounter within last 3 months    Recent Outpatient Visits           3 weeks ago Chronic pansinusitis   [facility] [name], FNP   4 months ago Neck pain, chronic    [facility] Tally Joe T, FNP   4 months ago Chronic pansinusitis   [facility] [name], FNP   7 months ago Chronic pansinusitis   [facility] [name], FNP   8 months ago Chronic pansinusitis   [facility] [name], [place]

## 2021-11-01 ENCOUNTER — Other Ambulatory Visit: Payer: Self-pay | Admitting: Family Medicine

## 2021-11-01 DIAGNOSIS — F419 Anxiety disorder, unspecified: Secondary | ICD-10-CM

## 2021-11-01 DIAGNOSIS — H02831 Dermatochalasis of right upper eyelid: Secondary | ICD-10-CM | POA: Diagnosis not present

## 2021-11-01 MED ORDER — ALPRAZOLAM 1 MG PO TABS: ORAL_TABLET | ORAL | 0 refills | Status: DC

## 2021-11-01 NOTE — Telephone Encounter (Signed)
Requested medication (s) are due for refill today: yes  Requested medication (s) are on the active medication list: yes  Last refill:  09/27/21 #45/0  Future visit scheduled: no  Notes to clinic:  Unable to refill per protocol, cannot delegate. Directions are as needed no specific # of times per day?    Requested Prescriptions  Pending Prescriptions Disp Refills   ALPRAZolam (XANAX) 1 MG tablet 45 tablet 0    Sig: Use orally as needed for anxiety.     Not Delegated - Psychiatry: Anxiolytics/Hypnotics 2 Failed - 11/01/2021 11:53 AM      Failed - This refill cannot be delegated      Failed - Urine Drug Screen completed in last 360 days      Passed - Patient is not pregnant      Passed - Valid encounter within last 6 months    Recent Outpatient Visits           1 month ago Chronic pansinusitis   [facility] [name], FNP   4 months ago Neck pain, chronic   [facility] Tally Joe T, FNP   5 months ago Chronic pansinusitis   [facility] [name], FNP   8 months ago Chronic pansinusitis   [facility] [name], FNP   8 months ago Chronic pansinusitis   [facility] [name], [place]

## 2021-11-01 NOTE — Telephone Encounter (Signed)
Medication Refill - Medication: ALPRAZolam (XANAX) 1 MG tablet   Has the patient contacted their pharmacy? yes (Agent: If no, request that the patient contact the pharmacy for the refill. If patient does not wish to contact the pharmacy document the reason why and proceed with request.) (Agent: If yes, when and what did the pharmacy advise?)ycontact pcp Preferred Pharmacy (with phone number or street name):  [id number] [place]:  [phone] Fax:  [phone]    Has the patient been seen for an appointment in the last year OR does the patient have an upcoming appointment? yes  Agent: Please be advised that RX refills may take up to 3 business days. We ask that you follow-up with your pharmacy.

## 2021-11-18 ENCOUNTER — Other Ambulatory Visit: Payer: Self-pay | Admitting: Family Medicine

## 2021-11-18 DIAGNOSIS — E034 Atrophy of thyroid (acquired): Secondary | ICD-10-CM

## 2021-11-21 ENCOUNTER — Telehealth (INDEPENDENT_AMBULATORY_CARE_PROVIDER_SITE_OTHER): Payer: Medicare Other | Admitting: Physician Assistant

## 2021-11-21 ENCOUNTER — Encounter: Payer: Self-pay | Admitting: Physician Assistant

## 2021-11-21 DIAGNOSIS — J324 Chronic pansinusitis: Secondary | ICD-10-CM

## 2021-11-21 DIAGNOSIS — R519 Headache, unspecified: Secondary | ICD-10-CM

## 2021-11-21 DIAGNOSIS — G8929 Other chronic pain: Secondary | ICD-10-CM | POA: Diagnosis not present

## 2021-11-21 DIAGNOSIS — Z889 Allergy status to unspecified drugs, medicaments and biological substances status: Secondary | ICD-10-CM | POA: Diagnosis not present

## 2021-11-21 MED ORDER — EPINEPHRINE 0.3 MG/0.3ML IJ SOAJ: 0.3000 mg | INTRAMUSCULAR | 1 refills | Status: DC | PRN

## 2021-11-21 MED ORDER — AZELASTINE HCL 0.1 % NA SOLN: 1.0000 | Freq: Two times a day (BID) | NASAL | 1 refills | Status: DC

## 2021-11-21 MED ORDER — CEFDINIR 300 MG PO CAPS: 300.0000 mg | ORAL_CAPSULE | Freq: Two times a day (BID) | ORAL | 0 refills | Status: AC

## 2021-11-21 NOTE — Progress Notes (Signed)
I,Sha'taria Tyson,acting as a Education administrator for [employer], PA-C.,have documented all relevant documentation on the behalf of Mikey Kirschner, PA-C,as directed by  Mikey Kirschner, PA-C while in the presence of Mikey Kirschner, PA-C.  MyChart Video Visit    Virtual Visit via Video Note   This visit type was conducted due to national recommendations for restrictions regarding the COVID-19 Pandemic (e.g. social distancing) in an effort to limit this patient's exposure and mitigate transmission in our community. This patient is at least at moderate risk for complications without adequate follow up. This format is felt to be most appropriate for this patient at this time. Physical exam was limited by quality of the video and audio technology used for the visit.   Patient location: home Provider location: [facility]  I discussed the limitations of evaluation and management by telemedicine and the availability of in person appointments. The patient expressed understanding and agreed to proceed.  Patient: Kathleen Buchanan   DOB: 03-27-1965   56 y.o. Female  MRN: [id number] Visit Date: 11/21/2021  Today's healthcare provider: Mikey Kirschner, PA-C   Cc. Headaches, nasal congestion, sore throat  Subjective     Pt reports symptoms of ear pain L>R, headaches, sore throat, swollen glands in neck, sinus congestion x 3 days. Report symptoms were lingering from her last sinus infection (seen 7/24 virtually) but worsened over the last three days. Using otc saline nasal spray. She did complete her antibiotic therapy when treated two months ago.   Reports chronic daily headaches she thinks are 2/2 sinus issues. Reports being referred to neurology but never made an appointment. Reports taking BC powders OTC twice daily every day for headaches. Reports on left side, throbbing pain.   Medications: Outpatient Medications Prior to Visit  Medication Sig   albuterol (VENTOLIN HFA) 108 (90 Base)  MCG/ACT inhaler INHALE 2 PUFFS INTO THE LUNGS EVERY 4 HOURS AS NEEDED FOR WHEEZE OR FOR SHORTNESS OF BREATH   ALPRAZolam (XANAX) 1 MG tablet Use orally as needed for anxiety.   amphetamine-dextroamphetamine (ADDERALL) 30 MG tablet Take 1 tablet by mouth 2 (two) times daily.   Aspirin-Caffeine (BC FAST PAIN RELIEF PO) Take by mouth as needed.   Blood Glucose Monitoring Suppl (ONETOUCH VERIO) w/Device KIT To check blood sugar daily for diabetes   cetirizine (ZYRTEC) 10 MG tablet Take 1 tablet (10 mg total) by mouth daily.   hydrochlorothiazide (HYDRODIURIL) 12.5 MG tablet TAKE 1 TABLET BY MOUTH EVERY DAY   ibuprofen (ADVIL,MOTRIN) 200 MG tablet Take 4 tablets by mouth as needed.   Lancets (ONETOUCH ULTRASOFT) lancets To check blood sugar daily for diabetes   levothyroxine (SYNTHROID) 75 MCG tablet TAKE 1 TABLET BY MOUTH EVERY DAY   ONETOUCH VERIO test strip TO CHECK BLOOD SUGAR DAILY FOR DIABETES   oxyCODONE-acetaminophen (ENDOCET) 7.5-325 MG tablet Take 1 tablet by mouth 2 (two) times daily.   pantoprazole (PROTONIX) 40 MG tablet TAKE 1 TABLET BY MOUTH EVERY DAY   sodium chloride (OCEAN) 0.65 % SOLN nasal spray Place 1 spray into both nostrils as needed for congestion.   Vitamin D, Ergocalciferol, (DRISDOL) 1.25 MG (50000 UNIT) CAPS capsule Take 1 capsule (50,000 Units total) by mouth every 7 (seven) days.   rosuvastatin (CRESTOR) 10 MG tablet TAKE 1 TABLET BY MOUTH EVERY DAY (Patient not taking: Reported on 11/21/2021)   [DISCONTINUED] acetaminophen (TYLENOL) 500 MG tablet Take 1,000 mg by mouth every 6 (six) hours as needed.  (Patient not taking: Reported on 11/21/2021)   [  DISCONTINUED] ascorbic acid (VITAMIN C) 500 MG tablet Take 1,000 mg by mouth daily.  (Patient not taking: Reported on 11/21/2021)   [DISCONTINUED] EPINEPHrine 0.3 mg/0.3 mL IJ SOAJ injection Inject into the muscle as needed. (Patient not taking: Reported on 11/21/2021)   [DISCONTINUED] propranolol (INDERAL) 20 MG tablet TAKE 1  TABLET BY MOUTH TWICE A DAY (Patient not taking: Reported on 11/21/2021)   No facility-administered medications prior to visit.    Review of Systems  HENT:  Positive for ear pain and sore throat.   Neurological:  Positive for headaches.    Objective    There were no vitals taken for this visit.  Physical Exam Constitutional:      General: She is not in acute distress.    Appearance: She is not ill-appearing.  Neurological:     Mental Status: She is oriented to person, place, and time.      Assessment & Plan     Problem List Items Addressed This Visit       Respiratory   Chronic pansinusitis - Primary    Was treated 7/23 with augmentin, pt did not have resolution of symptoms Advised covid test  Discussed viral vs bacterial etiology Trial of omnicef (multiple drug allergies, recent augmentin use) , rx azelastine nasal spray Ref back to ENT, stressed importance      Relevant Medications   cefdinir (OMNICEF) 300 MG capsule   azelastine (ASTELIN) 0.1 % nasal spray   Other Relevant Orders   Ambulatory referral to ENT     Other   Chronic intractable headache    Daily headache does not sound like cluster or migraine Ref to neurology but ultimately would benefit from ENT ref likely 2/2 sinus issues Advised AGAINST twice daily BC powder use      Relevant Orders   Ambulatory referral to Neurology   Allergy history, drug    Refilled epipen      Relevant Medications   EPINEPHrine 0.3 mg/0.3 mL IJ SOAJ injection     Return if symptoms worsen or fail to improve.     I discussed the assessment and treatment plan with the patient. The patient was provided an opportunity to ask questions and all were answered. The patient agreed with the plan and demonstrated an understanding of the instructions.   The patient was advised to call back or seek an in-person evaluation if the symptoms worsen or if the condition fails to improve as anticipated.  I provided 15 minutes of  non-face-to-face time during this encounter.  I, Mikey Kirschner, PA-C have reviewed all documentation for this visit. The documentation on  11/21/2021  for the exam, diagnosis, procedures, and orders are all accurate and complete.Mikey Kirschner, PA-C [facility] [address] [place], [place], [address] Office: [phone] Fax: [place]

## 2021-11-21 NOTE — Assessment & Plan Note (Signed)
Refilled epipen ?

## 2021-11-21 NOTE — Assessment & Plan Note (Signed)
Daily headache does not sound like cluster or migraine Ref to neurology but ultimately would benefit from ENT ref likely 2/2 sinus issues Advised AGAINST twice daily BC powder use

## 2021-11-21 NOTE — Assessment & Plan Note (Addendum)
Was treated 7/23 with augmentin, pt did not have resolution of symptoms Advised covid test  Discussed viral vs bacterial etiology Trial of omnicef (multiple drug allergies, recent augmentin use) , rx azelastine nasal spray Ref back to ENT, stressed importance

## 2021-11-26 ENCOUNTER — Other Ambulatory Visit: Payer: Self-pay | Admitting: Family Medicine

## 2021-11-26 DIAGNOSIS — M778 Other enthesopathies, not elsewhere classified: Secondary | ICD-10-CM

## 2021-11-26 DIAGNOSIS — F900 Attention-deficit hyperactivity disorder, predominantly inattentive type: Secondary | ICD-10-CM

## 2021-11-26 NOTE — Telephone Encounter (Signed)
Medication Refill - Medication: oxyCODONE-acetaminophen (ENDOCET) 7.5-325 MG tablet [[id number]]   amphetamine-dextroamphetamine (ADDERALL) 30 MG tablet [[id number]]   Has the patient contacted their pharmacy? Yes.   (Agent: If no, request that the patient contact the pharmacy for the refill. If patient does not wish to contact the pharmacy document the reason why and proceed with request.) (Agent: If yes, when and what did the pharmacy advise?)  Preferred Pharmacy (with phone number or street name):  [place], [place] [address]  [place] [address]  Phone: [phone] Fax: [phone]  Hours: Not open 24 hours   Has the patient been seen for an appointment in the last year OR does the patient have an upcoming appointment? Yes.    Agent: Please be advised that RX refills may take up to 3 business days. We ask that you follow-up with your pharmacy.

## 2021-11-27 NOTE — Telephone Encounter (Signed)
Requested medication (s) are due for refill today: yes  Requested medication (s) are on the active medication list: yes  Last refill:  10/24/21  Future visit scheduled yes  Notes to clinic:  Unable to refill per protocol, cannot delegate.      Requested Prescriptions  Pending Prescriptions Disp Refills   amphetamine-dextroamphetamine (ADDERALL) 30 MG tablet 60 tablet 0    Sig: Take 1 tablet by mouth 2 (two) times daily.     Not Delegated - Psychiatry:  Stimulants/ADHD Failed - 11/26/2021 10:27 AM      Failed - This refill cannot be delegated      Failed - Urine Drug Screen completed in last 360 days      Passed - Last BP in normal range    BP Readings from Last 1 Encounters:  06/01/21 (!) 110/51         Passed - Last Heart Rate in normal range    Pulse Readings from Last 1 Encounters:  06/01/21 72         Passed - Valid encounter within last 6 months    Recent Outpatient Visits           6 days ago Chronic pansinusitis   [facility] Thedore Mins, [place], PA-C   1 month ago Chronic pansinusitis   [facility] [name], FNP   5 months ago Neck pain, chronic   [facility] Tally Joe T, FNP   5 months ago Chronic pansinusitis   [facility] Tally Joe T, FNP   8 months ago Chronic pansinusitis   [facility] Tally Joe T, FNP               oxyCODONE-acetaminophen (ENDOCET) 7.5-325 MG tablet 60 tablet 0    Sig: Take 1 tablet by mouth 2 (two) times daily.     Not Delegated - Analgesics:  Opioid Agonist Combinations Failed - 11/26/2021 10:27 AM      Failed - This refill cannot be delegated      Failed - Urine Drug Screen completed in last 360 days      Passed - Valid encounter within last 3 months    Recent Outpatient Visits           6 days ago Chronic pansinusitis   [facility] Mikey Kirschner, PA-C   1 month ago Chronic pansinusitis   [facility]  [name], FNP   5 months ago Neck pain, chronic   [facility] [name], FNP   5 months ago Chronic pansinusitis   [facility] [name], FNP   8 months ago Chronic pansinusitis   [facility] [name], [place]

## 2021-11-28 MED ORDER — OXYCODONE-ACETAMINOPHEN 7.5-325 MG PO TABS: 1.0000 | ORAL_TABLET | Freq: Two times a day (BID) | ORAL | 0 refills | Status: DC

## 2021-11-28 MED ORDER — AMPHETAMINE-DEXTROAMPHETAMINE 30 MG PO TABS: 1.0000 | ORAL_TABLET | Freq: Two times a day (BID) | ORAL | 0 refills | Status: DC

## 2021-12-05 NOTE — Progress Notes (Signed)
I,Sha'taria Tyson,acting as a Education administrator for [name], FNP.,have documented all relevant documentation on the behalf of [name], FNP,as directed by  [name], FNP while in the presence of [name], FNP.   Established patient visit   Patient: Kathleen Buchanan   DOB: 1966-01-05   56 y.o. Female  MRN: [id number] Visit Date: 12/07/2021  Today's healthcare provider: [name], FNP  Re Introduced to nurse practitioner role and practice setting.  All questions answered.  Discussed provider/patient relationship and expectations.   No chief complaint on file.  Subjective    HPI  Anxiety, Follow-up  She was last seen for anxiety 6 months ago. Changes made at last visit include continue xanax 77m.   She reports excellent compliance with treatment. She reports good tolerance of treatment. She is not having side effects.   She feels her anxiety is depends on when she leaves the house and Unchanged since last visit.  Symptoms: No chest pain No difficulty concentrating  Yes dizziness Yes fatigue  No feelings of losing control No insomnia  No irritable No palpitations  Yes panic attacks No racing thoughts  No shortness of breath No sweating  No tremors/shakes    GAD-7 Results    02/06/2021    1:54 PM  GAD-7 Generalized Anxiety Disorder Screening Tool  1. Feeling Nervous, Anxious, or on Edge 1  2. Not Being Able to Stop or Control Worrying 0  3. Worrying Too Much About Different Things 0  4. Trouble Relaxing 1  5. Being So Restless it's Hard To Sit Still 0  6. Becoming Easily Annoyed or Irritable 1  7. Feeling Afraid As If Something Awful Might Happen 0  Total GAD-7 Score 3  Difficulty At Work, Home, or Getting  Along With Others? Not difficult at all    PHQ-9 Scores    10/01/2021   10:33 AM 07/05/2021    1:06 PM 02/06/2021    1:56 PM  PHQ9 SCORE ONLY  PHQ-9 Total Score 4 0 0     ---------------------------------------------------------------------------------------------------   Medications: Outpatient Medications Prior to Visit  Medication Sig   albuterol (VENTOLIN HFA) 108 (90 Base) MCG/ACT inhaler INHALE 2 PUFFS INTO THE LUNGS EVERY 4 HOURS AS NEEDED FOR WHEEZE OR FOR SHORTNESS OF BREATH   Aspirin-Caffeine (BC FAST PAIN RELIEF PO) Take by mouth as needed.   azelastine (ASTELIN) 0.1 % nasal spray Place 1 spray into both nostrils 2 (two) times daily. Use in each nostril as directed   Blood Glucose Monitoring Suppl (ONETOUCH VERIO) w/Device KIT To check blood sugar daily for diabetes   cetirizine (ZYRTEC) 10 MG tablet Take 1 tablet (10 mg total) by mouth daily.   EPINEPHrine 0.3 mg/0.3 mL IJ SOAJ injection Inject 0.3 mg into the muscle as needed.   hydrochlorothiazide (HYDRODIURIL) 12.5 MG tablet TAKE 1 TABLET BY MOUTH EVERY DAY   ibuprofen (ADVIL,MOTRIN) 200 MG tablet Take 4 tablets by mouth as needed.   Lancets (ONETOUCH ULTRASOFT) lancets To check blood sugar daily for diabetes   levothyroxine (SYNTHROID) 75 MCG tablet TAKE 1 TABLET BY MOUTH EVERY DAY   ONETOUCH VERIO test strip TO CHECK BLOOD SUGAR DAILY FOR DIABETES   oxyCODONE-acetaminophen (ENDOCET) 7.5-325 MG tablet Take 1 tablet by mouth 2 (two) times daily.   pantoprazole (PROTONIX) 40 MG tablet TAKE 1 TABLET BY MOUTH EVERY DAY   rosuvastatin (CRESTOR) 10 MG tablet TAKE 1 TABLET BY MOUTH EVERY DAY   sodium chloride (  OCEAN) 0.65 % SOLN nasal spray Place 1 spray into both nostrils as needed for congestion.   Vitamin D, Ergocalciferol, (DRISDOL) 1.25 MG (50000 UNIT) CAPS capsule Take 1 capsule (50,000 Units total) by mouth every 7 (seven) days.   [DISCONTINUED] ALPRAZolam (XANAX) 1 MG tablet Use orally as needed for anxiety.   [DISCONTINUED] amphetamine-dextroamphetamine (ADDERALL) 30 MG tablet Take 1 tablet by mouth 2 (two) times daily.   No facility-administered medications prior to visit.    Review  of Systems    Objective    BP 112/63 (BP Location: Left Arm, Patient Position: Sitting, Cuff Size: Normal)   Pulse 68   SpO2 98%   Physical Exam   No results found for any visits on 12/07/21.  Assessment & Plan     Problem List Items Addressed This Visit       Other   ADD (attention deficit disorder)    Chronic stable Continue 30 mg adderall to assist PDMP reviewed Due for urine drug screen Spring 24      Relevant Medications   amphetamine-dextroamphetamine (ADDERALL) 30 MG tablet   Anxiety    Chronic, improved Request to change to valium to prevent fatigue following use of previous benzo, ativan PDMP reviewed      Relevant Medications   diazepam (VALIUM) 2 MG tablet   Constipation - Primary    Chronic, worsening Sometimes moving BM 3-4 weeks Trial of lactulose; titrate to 2 BM per week      Relevant Medications   lactulose (CHRONULAC) 10 GM/15ML solution     Return in about 6 months (around 06/07/2022) for annual examination.      Vonna Kotyk, FNP, have reviewed all documentation for this visit. The documentation on 12/07/21 for the exam, diagnosis, procedures, and orders are all accurate and complete.  [name], [place] [phone] (phone) [phone] (fax)  [place]

## 2021-12-07 ENCOUNTER — Encounter: Payer: Self-pay | Admitting: Family Medicine

## 2021-12-07 ENCOUNTER — Ambulatory Visit (INDEPENDENT_AMBULATORY_CARE_PROVIDER_SITE_OTHER): Payer: Medicare Other | Admitting: Family Medicine

## 2021-12-07 VITALS — BP 112/63 | HR 68

## 2021-12-07 DIAGNOSIS — F900 Attention-deficit hyperactivity disorder, predominantly inattentive type: Secondary | ICD-10-CM | POA: Diagnosis not present

## 2021-12-07 DIAGNOSIS — K5904 Chronic idiopathic constipation: Secondary | ICD-10-CM

## 2021-12-07 DIAGNOSIS — F419 Anxiety disorder, unspecified: Secondary | ICD-10-CM | POA: Diagnosis not present

## 2021-12-07 MED ORDER — LACTULOSE 10 GM/15ML PO SOLN: 10.0000 g | Freq: Two times a day (BID) | ORAL | 0 refills | Status: DC | PRN

## 2021-12-07 MED ORDER — DIAZEPAM 2 MG PO TABS: 2.0000 mg | ORAL_TABLET | Freq: Two times a day (BID) | ORAL | 0 refills | Status: DC | PRN

## 2021-12-07 MED ORDER — AMPHETAMINE-DEXTROAMPHETAMINE 30 MG PO TABS: 1.0000 | ORAL_TABLET | Freq: Two times a day (BID) | ORAL | 0 refills | Status: DC

## 2021-12-07 NOTE — Assessment & Plan Note (Signed)
Chronic, worsening Sometimes moving BM 3-4 weeks Trial of lactulose; titrate to 2 BM per week

## 2021-12-07 NOTE — Patient Instructions (Signed)
Please contact [phone] to schedule your mammogram. They will ask which location you prefer to be seen in. You have two options listed below.  1) [facility] located at [address] [place], [place] [address] 2) [facility] [place] located at [address] [place], [place] [address]  Please feel free to contact us if you have any further questions or concerns  

## 2021-12-07 NOTE — Assessment & Plan Note (Signed)
Chronic, improved Request to change to valium to prevent fatigue following use of previous benzo, ativan PDMP reviewed

## 2021-12-07 NOTE — Assessment & Plan Note (Signed)
Chronic stable Continue 30 mg adderall to assist PDMP reviewed Due for urine drug screen Spring 24

## 2021-12-10 ENCOUNTER — Telehealth: Payer: Self-pay

## 2021-12-10 ENCOUNTER — Other Ambulatory Visit: Payer: Self-pay | Admitting: Family Medicine

## 2021-12-10 MED ORDER — AMOXICILLIN-POT CLAVULANATE 875-125 MG PO TABS: 1.0000 | ORAL_TABLET | Freq: Two times a day (BID) | ORAL | 0 refills | Status: DC

## 2021-12-10 NOTE — Telephone Encounter (Signed)
Copied from [place] [id number]. Topic: General - Other >> Dec 10, 2021  3:08 PM Ja-Kwan M wrote: Reason for CRM: Pt stated a Rx for Augmentin was suppose to be sent in to her pharmacy but it was never sent. Pt requests that the Rx be sent to [place], [place] - [place] Phone: [phone]  Fax: [phone]

## 2021-12-27 ENCOUNTER — Other Ambulatory Visit: Payer: Self-pay | Admitting: Family Medicine

## 2021-12-27 DIAGNOSIS — F900 Attention-deficit hyperactivity disorder, predominantly inattentive type: Secondary | ICD-10-CM

## 2021-12-27 DIAGNOSIS — M778 Other enthesopathies, not elsewhere classified: Secondary | ICD-10-CM

## 2021-12-27 NOTE — Telephone Encounter (Signed)
Requested medication (s) are due for refill today: Adderall due 01/06/22   Oxy due 12/28/21  Requested medication (s) are on the active medication list: yes  Last refill:   Adderall 12/07/21  #60  0 refills    Oxy 11/28/21  #60 0 refills  Future visit scheduled yes 06/07/22  Notes to clinic: Not delegated, please review. Thank you.   Requested Prescriptions  Pending Prescriptions Disp Refills   amphetamine-dextroamphetamine (ADDERALL) 30 MG tablet 60 tablet 0    Sig: Take 1 tablet by mouth 2 (two) times daily.     Not Delegated - Psychiatry:  Stimulants/ADHD Failed - 12/27/2021  4:30 PM      Failed - This refill cannot be delegated      Failed - Urine Drug Screen completed in last 360 days      Passed - Last BP in normal range    BP Readings from Last 1 Encounters:  12/07/21 112/63         Passed - Last Heart Rate in normal range    Pulse Readings from Last 1 Encounters:  12/07/21 68         Passed - Valid encounter within last 6 months    Recent Outpatient Visits           2 weeks ago Chronic idiopathic constipation   [facility] [name], FNP   1 month ago Chronic pansinusitis   [facility] Thedore Mins, [place], PA-C   2 months ago Chronic pansinusitis   [facility] [name], FNP   6 months ago Neck pain, chronic   [facility] Tally Joe T, FNP   6 months ago Chronic pansinusitis   [facility] Tally Joe T, FNP               oxyCODONE-acetaminophen (ENDOCET) 7.5-325 MG tablet 60 tablet 0    Sig: Take 1 tablet by mouth 2 (two) times daily.     Not Delegated - Analgesics:  Opioid Agonist Combinations Failed - 12/27/2021  4:30 PM      Failed - This refill cannot be delegated      Failed - Urine Drug Screen completed in last 360 days      Passed - Valid encounter within last 3 months    Recent Outpatient Visits           2 weeks ago Chronic idiopathic constipation    [facility] [name], FNP   1 month ago Chronic pansinusitis   [facility] Mikey Kirschner, PA-C   2 months ago Chronic pansinusitis   [facility] [name], FNP   6 months ago Neck pain, chronic   [facility] [name], FNP   6 months ago Chronic pansinusitis   [facility] [name], [place]

## 2021-12-27 NOTE — Telephone Encounter (Signed)
Medication Refill - Medication: amphetamine-dextroamphetamine (ADDERALL) 30 MG tablet/oxyCODONE-acetaminophen (ENDOCET) 7.5-325 MG tablet   Has the patient contacted their pharmacy? No has oxycodone order too, so have to call us (Agent: If no, request that the patient contact the pharmacy for the refill. If patient does not wish to contact the pharmacy document the reason why and proceed with request.) (Agent: If yes, when and what did the pharmacy advise?)  Preferred Pharmacy (with phone number or street name):  [place], [place] Phone:  [phone]  Fax:  [phone]     Has the patient been seen for an appointment in the last year OR does the patient have an upcoming appointment? yes  Agent: Please be advised that RX refills may take up to 3 business days. We ask that you follow-up with your pharmacy.

## 2021-12-28 MED ORDER — OXYCODONE-ACETAMINOPHEN 7.5-325 MG PO TABS: 1.0000 | ORAL_TABLET | Freq: Two times a day (BID) | ORAL | 0 refills | Status: DC

## 2021-12-28 MED ORDER — AMPHETAMINE-DEXTROAMPHETAMINE 30 MG PO TABS: 1.0000 | ORAL_TABLET | Freq: Two times a day (BID) | ORAL | 0 refills | Status: DC

## 2022-01-27 ENCOUNTER — Other Ambulatory Visit: Payer: Self-pay | Admitting: Family Medicine

## 2022-01-27 DIAGNOSIS — R609 Edema, unspecified: Secondary | ICD-10-CM

## 2022-01-27 DIAGNOSIS — K219 Gastro-esophageal reflux disease without esophagitis: Secondary | ICD-10-CM

## 2022-01-28 ENCOUNTER — Other Ambulatory Visit: Payer: Self-pay | Admitting: Family Medicine

## 2022-01-28 DIAGNOSIS — M778 Other enthesopathies, not elsewhere classified: Secondary | ICD-10-CM

## 2022-01-28 NOTE — Telephone Encounter (Unsigned)
Copied from [place] [phone]. Topic: General - Other >> Jan 28, 2022  2:19 PM Everette C wrote: Reason for CRM: Medication Refill - Medication: oxyCODONE-acetaminophen (ENDOCET) 7.5-325 MG tablet [[id number]]  diazepam (VALIUM) 2 MG tablet [[id number]]  Has the patient contacted their pharmacy? No. (Agent: If no, request that the patient contact the pharmacy for the refill. If patient does not wish to contact the pharmacy document the reason why and proceed with request.) (Agent: If yes, when and what did the pharmacy advise?)  Preferred Pharmacy (with phone number or street name): [place], [place] - [address] [address] Phone: [phone] Fax: [phone] Hours: Not open 24 hours   Has the patient been seen for an appointment in the last year OR does the patient have an upcoming appointment? Yes.    Agent: Please be advised that RX refills may take up to 3 business days. We ask that you follow-up with your pharmacy.

## 2022-01-29 MED ORDER — DIAZEPAM 2 MG PO TABS: 2.0000 mg | ORAL_TABLET | Freq: Two times a day (BID) | ORAL | 0 refills | Status: DC | PRN

## 2022-01-29 MED ORDER — OXYCODONE-ACETAMINOPHEN 7.5-325 MG PO TABS: 1.0000 | ORAL_TABLET | Freq: Two times a day (BID) | ORAL | 0 refills | Status: DC

## 2022-01-29 NOTE — Telephone Encounter (Signed)
Requested medication (s) are due for refill today: routing for review  Requested medication (s) are on the active medication list: yes  Last refill:  12/07/21  Future visit scheduled: yes  Notes to clinic:  Unable to refill per protocol, cannot delegate.      Requested Prescriptions  Pending Prescriptions Disp Refills   diazepam (VALIUM) 2 MG tablet 60 tablet 0    Sig: Take 1 tablet (2 mg total) by mouth every 12 (twelve) hours as needed for anxiety.     Not Delegated - Psychiatry: Anxiolytics/Hypnotics 2 Failed - 01/28/2022  3:13 PM      Failed - This refill cannot be delegated      Failed - Urine Drug Screen completed in last 360 days      Passed - Patient is not pregnant      Passed - Valid encounter within last 6 months    Recent Outpatient Visits           1 month ago Chronic idiopathic constipation   [facility] Tally Joe T, FNP   2 months ago Chronic pansinusitis   [facility] Thedore Mins, [place], PA-C   4 months ago Chronic pansinusitis   [facility] [name], FNP   7 months ago Neck pain, chronic   [facility] Tally Joe T, FNP   8 months ago Chronic pansinusitis   [facility] Tally Joe T, FNP               oxyCODONE-acetaminophen (ENDOCET) 7.5-325 MG tablet 60 tablet 0    Sig: Take 1 tablet by mouth 2 (two) times daily.     Not Delegated - Analgesics:  Opioid Agonist Combinations Failed - 01/28/2022  3:13 PM      Failed - This refill cannot be delegated      Failed - Urine Drug Screen completed in last 360 days      Passed - Valid encounter within last 3 months    Recent Outpatient Visits           1 month ago Chronic idiopathic constipation   [facility] [name], FNP   2 months ago Chronic pansinusitis   [facility] Thedore Mins, [place], PA-C   4 months ago Chronic pansinusitis   [facility] [name], FNP    7 months ago Neck pain, chronic   [facility] [name], FNP   8 months ago Chronic pansinusitis   [facility] [name], [place]

## 2022-02-17 ENCOUNTER — Other Ambulatory Visit: Payer: Self-pay | Admitting: Family Medicine

## 2022-02-17 DIAGNOSIS — G43011 Migraine without aura, intractable, with status migrainosus: Secondary | ICD-10-CM

## 2022-02-25 ENCOUNTER — Other Ambulatory Visit: Payer: Self-pay | Admitting: Family Medicine

## 2022-02-25 DIAGNOSIS — J4520 Mild intermittent asthma, uncomplicated: Secondary | ICD-10-CM

## 2022-02-25 NOTE — Telephone Encounter (Signed)
Medication Refill - Medication: albuterol (VENTOLIN HFA) 108 (90 Base) MCG/ACT inhaler   Has the patient contacted their pharmacy? no (Agent: If no, request that the patient contact the pharmacy for the refill. If patient does not wish to contact the pharmacy document the reason why and proceed with request.) (Agent: If yes, when and what did the pharmacy advise?)patient says every time she calls for refill on this med, she is told to call us.  Preferred Pharmacy (with phone number or street name): [place], [place]  Phone: [phone] Fax: [phone] Has the patient been seen for an appointment in the last year OR does the patient have an upcoming appointment? yes  Agent: Please be advised that RX refills may take up to 3 business days. We ask that you follow-up with your pharmacy.

## 2022-02-26 ENCOUNTER — Other Ambulatory Visit: Payer: Self-pay | Admitting: Family Medicine

## 2022-02-26 DIAGNOSIS — M778 Other enthesopathies, not elsewhere classified: Secondary | ICD-10-CM

## 2022-02-26 DIAGNOSIS — F900 Attention-deficit hyperactivity disorder, predominantly inattentive type: Secondary | ICD-10-CM

## 2022-02-26 MED ORDER — ALBUTEROL SULFATE HFA 108 (90 BASE) MCG/ACT IN AERS: INHALATION_SPRAY | RESPIRATORY_TRACT | 0 refills | Status: DC

## 2022-02-26 NOTE — Telephone Encounter (Signed)
Medication Refill - Medication: amphetamine-dextroamphetamine (ADDERALL) 30 MG tablet / pt requested this refill yesterday but was sent albuterol refill instead / please advise   oxyCODONE-acetaminophen (ENDOCET) 7.5-325 MG tablet [[id number]]     Has the patient contacted their pharmacy? Yes.   (Agent: If no, request that the patient contact the pharmacy for the refill. If patient does not wish to contact the pharmacy document the reason why and proceed with request.) (Agent: If yes, when and what did the pharmacy advise?)  Preferred Pharmacy (with phone number or street name): [place], [place]  Has the patient been seen for an appointment in the last year OR does the patient have an upcoming appointment? Yes.    Agent: Please be advised that RX refills may take up to 3 business days. We ask that you follow-up with your pharmacy.

## 2022-02-26 NOTE — Telephone Encounter (Signed)
Requested Prescriptions  Pending Prescriptions Disp Refills   albuterol (VENTOLIN HFA) 108 (90 Base) MCG/ACT inhaler 18 g 0    Sig: INHALE 2 PUFFS INTO THE LUNGS EVERY 4 HOURS AS NEEDED FOR WHEEZE OR FOR SHORTNESS OF BREATH     Pulmonology:  Beta Agonists 2 Passed - 02/25/2022 12:06 PM      Passed - Last BP in normal range    BP Readings from Last 1 Encounters:  12/07/21 112/63         Passed - Last Heart Rate in normal range    Pulse Readings from Last 1 Encounters:  12/07/21 68         Passed - Valid encounter within last 12 months    Recent Outpatient Visits           2 months ago Chronic idiopathic constipation   [facility] [name], FNP   3 months ago Chronic pansinusitis   [facility] [place], [place], PA-C   4 months ago Chronic pansinusitis   [facility] [name], FNP   8 months ago Neck pain, chronic   [facility] [name], FNP   9 months ago Chronic pansinusitis   [facility] [name], 

## 2022-02-27 MED ORDER — OXYCODONE-ACETAMINOPHEN 7.5-325 MG PO TABS: 1.0000 | ORAL_TABLET | Freq: Two times a day (BID) | ORAL | 0 refills | Status: DC

## 2022-02-27 MED ORDER — AMPHETAMINE-DEXTROAMPHETAMINE 30 MG PO TABS: 1.0000 | ORAL_TABLET | Freq: Two times a day (BID) | ORAL | 0 refills | Status: DC

## 2022-02-27 NOTE — Telephone Encounter (Signed)
Requested medication (s) are due for refill today: routing for review  Requested medication (s) are on the active medication list: yes  Last refill:  12/28/21 for adderall and 01/29/22 for endocet  Future visit scheduled: yes  Notes to clinic:  Unable to refill per protocol, cannot delegate.      Requested Prescriptions  Pending Prescriptions Disp Refills   amphetamine-dextroamphetamine (ADDERALL) 30 MG tablet 60 tablet 0    Sig: Take 1 tablet by mouth 2 (two) times daily.     Not Delegated - Psychiatry:  Stimulants/ADHD Failed - 02/26/2022  2:27 PM      Failed - This refill cannot be delegated      Failed - Urine Drug Screen completed in last 360 days      Passed - Last BP in normal range    BP Readings from Last 1 Encounters:  12/07/21 112/63         Passed - Last Heart Rate in normal range    Pulse Readings from Last 1 Encounters:  12/07/21 68         Passed - Valid encounter within last 6 months    Recent Outpatient Visits           2 months ago Chronic idiopathic constipation   [facility] [name], FNP   3 months ago Chronic pansinusitis   [facility] Thedore Mins, [place], PA-C   4 months ago Chronic pansinusitis   [facility] [name], FNP   8 months ago Neck pain, chronic   [facility] Tally Joe T, FNP   9 months ago Chronic pansinusitis   [facility] Tally Joe T, [place]               oxyCODONE-acetaminophen (ENDOCET) 7.5-325 MG tablet 60 tablet 0    Sig: Take 1 tablet by mouth 2 (two) times daily.     Not Delegated - Analgesics:  Opioid Agonist Combinations Failed - 02/26/2022  2:27 PM      Failed - This refill cannot be delegated      Failed - Urine Drug Screen completed in last 360 days      Passed - Valid encounter within last 3 months    Recent Outpatient Visits           2 months ago Chronic idiopathic constipation   [facility] [name], FNP   3 months ago Chronic pansinusitis   [facility] Thedore Mins, [place], PA-C   4 months ago Chronic pansinusitis   [facility] [name], FNP   8 months ago Neck pain, chronic   [facility] [name], FNP   9 months ago Chronic pansinusitis   [facility] [name], [place]

## 2022-03-06 ENCOUNTER — Other Ambulatory Visit: Payer: Self-pay | Admitting: Family Medicine

## 2022-04-01 ENCOUNTER — Other Ambulatory Visit: Payer: Self-pay | Admitting: Family Medicine

## 2022-04-01 DIAGNOSIS — M778 Other enthesopathies, not elsewhere classified: Secondary | ICD-10-CM

## 2022-04-01 DIAGNOSIS — F900 Attention-deficit hyperactivity disorder, predominantly inattentive type: Secondary | ICD-10-CM

## 2022-04-01 NOTE — Telephone Encounter (Signed)
Pt called to add that she wants her two meds sent to this pharmacy:  [place], [place] - [address] [address]  Phone: [phone] Fax: [phone]

## 2022-04-01 NOTE — Telephone Encounter (Signed)
Medication Refill - Medication: amphetamine-dextroamphetamine (ADDERALL) 30 MG tablet  oxyCODONE-acetaminophen (ENDOCET) 7.5-325 MG tablet     Has the patient contacted their pharmacy? Yes.     Preferred Pharmacy (with phone number or street name):  [place], [place] Phone: [phone]  Fax: [phone]     Has the patient been seen for an appointment in the last year OR does the patient have an upcoming appointment? Yes.    Agent: Please be advised that RX refills may take up to 3 business days. We ask that you follow-up with your pharmacy.

## 2022-04-02 MED ORDER — AMPHETAMINE-DEXTROAMPHETAMINE 30 MG PO TABS: 1.0000 | ORAL_TABLET | Freq: Two times a day (BID) | ORAL | 0 refills | Status: DC

## 2022-04-02 MED ORDER — OXYCODONE-ACETAMINOPHEN 7.5-325 MG PO TABS: 1.0000 | ORAL_TABLET | Freq: Two times a day (BID) | ORAL | 0 refills | Status: DC

## 2022-04-02 NOTE — Telephone Encounter (Signed)
Requested medication (s) are due for refill today: yes  Requested medication (s) are on the active medication list: yes  Last refill:  both ordered 02/27/22 # 60 each  Future visit scheduled: yes  Notes to clinic:  med not delegated to NT to RF   Requested Prescriptions  Pending Prescriptions Disp Refills   amphetamine-dextroamphetamine (ADDERALL) 30 MG tablet 60 tablet 0    Sig: Take 1 tablet by mouth 2 (two) times daily.     Not Delegated - Psychiatry:  Stimulants/ADHD Failed - 04/01/2022 10:29 AM      Failed - This refill cannot be delegated      Failed - Urine Drug Screen completed in last 360 days      Passed - Last BP in normal range    BP Readings from Last 1 Encounters:  12/07/21 112/63         Passed - Last Heart Rate in normal range    Pulse Readings from Last 1 Encounters:  12/07/21 68         Passed - Valid encounter within last 6 months    Recent Outpatient Visits           3 months ago Chronic idiopathic constipation   [place] [name], FNP   4 months ago Chronic pansinusitis   [place] Mikey Kirschner, PA-C   6 months ago Chronic pansinusitis   [facility] [name], FNP   9 months ago Neck pain, chronic   [place] Tally Joe T, FNP   10 months ago Chronic pansinusitis   [facility] Tally Joe T, [place]               oxyCODONE-acetaminophen (ENDOCET) 7.5-325 MG tablet 60 tablet 0    Sig: Take 1 tablet by mouth 2 (two) times daily.     Not Delegated - Analgesics:  Opioid Agonist Combinations Failed - 04/01/2022 10:29 AM      Failed - This refill cannot be delegated      Failed - Urine Drug Screen completed in last 360 days      Failed - Valid encounter within last 3 months    Recent Outpatient Visits           3 months ago Chronic idiopathic constipation   [place] [name], FNP   4 months ago Chronic pansinusitis   [place] Mikey Kirschner, PA-C   6 months ago Chronic pansinusitis   [facility] [name], FNP   9 months ago Neck pain, chronic   [place] [name], FNP   10 months ago Chronic pansinusitis   [facility] [name], [place]

## 2022-04-11 ENCOUNTER — Other Ambulatory Visit: Payer: Self-pay | Admitting: Family Medicine

## 2022-04-11 DIAGNOSIS — F419 Anxiety disorder, unspecified: Secondary | ICD-10-CM

## 2022-04-11 NOTE — Telephone Encounter (Signed)
Medication Refill - Medication: ALPRAZolam (XANAX) 1 MG tablet [[id number]]  DISCONTINUED   Has the patient contacted their pharmacy? Yes.   (Agent: If no, request that the patient contact the pharmacy for the refill. If patient does not wish to contact the pharmacy document the reason why and proceed with request.) (Agent: If yes, when and what did the pharmacy advise?)  Preferred Pharmacy (with phone number or street name): [place], [place] Phone: [phone]  Fax: [phone]     Has the patient been seen for an appointment in the last year OR does the patient have an upcoming appointment? Yes.    Agent: Please be advised that RX refills may take up to 3 business days. We ask that you follow-up with your pharmacy.

## 2022-04-12 NOTE — Telephone Encounter (Signed)
Requested medication (s) are due for refill today:   Provider to review but it was discontinued on 12/07/2021  Requested medication (s) are on the active medication list:   No  Future visit scheduled:   No   Last ordered: see above  Returned because it's a non delegated refill   Requested Prescriptions  Pending Prescriptions Disp Refills   ALPRAZolam (XANAX) 1 MG tablet 45 tablet 0    Sig: Use orally as needed for anxiety.     Not Delegated - Psychiatry: Anxiolytics/Hypnotics 2 Failed - 04/11/2022  3:53 PM      Failed - This refill cannot be delegated      Failed - Urine Drug Screen completed in last 360 days      Passed - Patient is not pregnant      Passed - Valid encounter within last 6 months    Recent Outpatient Visits           4 months ago Chronic idiopathic constipation   [place] [name], FNP   4 months ago Chronic pansinusitis   [place] Mikey Kirschner, PA-C   6 months ago Chronic pansinusitis   [facility] [name], FNP   10 months ago Neck pain, chronic   [place] [name], FNP   10 months ago Chronic pansinusitis   [facility] [name], [place]

## 2022-04-16 DIAGNOSIS — H02834 Dermatochalasis of left upper eyelid: Secondary | ICD-10-CM | POA: Diagnosis not present

## 2022-04-25 ENCOUNTER — Other Ambulatory Visit: Payer: Self-pay | Admitting: Family Medicine

## 2022-04-25 DIAGNOSIS — E039 Hypothyroidism, unspecified: Secondary | ICD-10-CM

## 2022-04-25 DIAGNOSIS — G43011 Migraine without aura, intractable, with status migrainosus: Secondary | ICD-10-CM

## 2022-04-25 DIAGNOSIS — E559 Vitamin D deficiency, unspecified: Secondary | ICD-10-CM

## 2022-04-25 DIAGNOSIS — E034 Atrophy of thyroid (acquired): Secondary | ICD-10-CM

## 2022-04-25 NOTE — Telephone Encounter (Signed)
Unable to refill per protocol, Rx expired. Levothyroxine and propranolol were discontinued. Other mediations too soon to refill. Requested Prescriptions  Refused Prescriptions Disp Refills   levothyroxine (SYNTHROID) 25 MCG tablet [Pharmacy Med Name: LEVOTHYROXINE 25 MCG TABLET] 90 tablet 1    Sig: TAKE 1 TABLET BY MOUTH EVERY DAY BEFORE BREAKFAST     Endocrinology:  Hypothyroid Agents Failed - 04/25/2022  1:09 PM      Failed - TSH in normal range and within 360 days    TSH  Date Value Ref Range Status  06/01/2021 9.220 (H) 0.450 - 4.500 uIU/mL Final         Passed - Valid encounter within last 12 months    Recent Outpatient Visits           4 months ago Chronic idiopathic constipation   [place] Tally Joe T, FNP   5 months ago Chronic pansinusitis   [place] Mikey Kirschner, PA-C   6 months ago Chronic pansinusitis   [place] [name], FNP   10 months ago Neck pain, chronic   [place] Tally Joe T, FNP   10 months ago Chronic pansinusitis   [facility] Tally Joe T, FNP               propranolol (INDERAL) 20 MG tablet [Pharmacy Med Name: PROPRANOLOL 20 MG TABLET] 180 tablet 1    Sig: TAKE 1 TABLET BY MOUTH TWICE A DAY     Cardiovascular:  Beta Blockers Passed - 04/25/2022  1:09 PM      Passed - Last BP in normal range    BP Readings from Last 1 Encounters:  12/07/21 112/63         Passed - Last Heart Rate in normal range    Pulse Readings from Last 1 Encounters:  12/07/21 68         Passed - Valid encounter within last 6 months    Recent Outpatient Visits           4 months ago Chronic idiopathic constipation   [place] Tally Joe T, FNP   5 months ago Chronic pansinusitis   [place] Mikey Kirschner, PA-C   6 months ago Chronic pansinusitis    [place] [name], FNP   10 months ago Neck pain, chronic   [place] Tally Joe T, FNP   10 months ago Chronic pansinusitis   [place] Tally Joe T, FNP               Vitamin D, Ergocalciferol, (DRISDOL) 1.25 MG (50000 UNIT) CAPS capsule [Pharmacy Med Name: VITAMIN D2 1.25MG(50,000 UNIT)] 13 capsule 1    Sig: TAKE 1 CAPSULE (50,000 UNITS TOTAL) BY MOUTH EVERY 7 (SEVEN) DAYS     Endocrinology:  Vitamins - Vitamin D Supplementation 2 Failed - 04/25/2022  1:09 PM      Failed - Manual Review: Route requests for 50,000 IU strength to the provider      Failed - Vitamin D in normal range and within 360 days    Vit D, 25-Hydroxy  Date Value Ref Range Status  06/01/2021 15.6 (L) 30.0 - 100.0 ng/mL Final    Comment:    Vitamin D deficiency has been defined by the Institute of Medicine and an Endocrine Society practice guideline  as a level of serum 25-OH vitamin D less than 20 ng/mL (1,2). The Endocrine Society went on to further define vitamin D insufficiency as a level between 21 and 29 ng/mL (2). 1. IOM (Institute of Medicine). 2010. Dietary reference    intakes for calcium and D. [place]: The    [employer]. 2. Holick MF, Binkley Clayton, Bischoff-Ferrari HA, et al.    Evaluation, treatment, and prevention of vitamin D    deficiency: an Endocrine Society clinical practice    guideline. JCEM. 2011 Jul; 96(7):1911-30.          Passed - Ca in normal range and within 360 days    Calcium  Date Value Ref Range Status  06/01/2021 9.3 8.7 - 10.2 mg/dL Final         Passed - Valid encounter within last 12 months    Recent Outpatient Visits           4 months ago Chronic idiopathic constipation   [place] Tally Joe T, FNP   5 months ago Chronic pansinusitis   [place] Mikey Kirschner, PA-C   6 months ago  Chronic pansinusitis   [place] [name], FNP   10 months ago Neck pain, chronic   [place] [name], FNP   10 months ago Chronic pansinusitis   [facility] Tally Joe T, FNP               levothyroxine (SYNTHROID) 75 MCG tablet [Pharmacy Med Name: LEVOTHYROXINE 75 MCG TABLET] 90 tablet 2    Sig: [place]     Endocrinology:  Hypothyroid Agents Failed - 04/25/2022  1:09 PM      Failed - TSH in normal range and within 360 days    TSH  Date Value Ref Range Status  06/01/2021 9.220 (H) 0.450 - 4.500 uIU/mL Final         Passed - Valid encounter within last 12 months    Recent Outpatient Visits           4 months ago Chronic idiopathic constipation   [place] Tally Joe T, FNP   5 months ago Chronic pansinusitis   [place] Mikey Kirschner, PA-C   6 months ago Chronic pansinusitis   [facility] [name], FNP   10 months ago Neck pain, chronic   [place] [name], FNP   10 months ago Chronic pansinusitis   [facility] [name], [place]

## 2022-05-03 ENCOUNTER — Other Ambulatory Visit: Payer: Self-pay | Admitting: Family Medicine

## 2022-05-03 DIAGNOSIS — M778 Other enthesopathies, not elsewhere classified: Secondary | ICD-10-CM

## 2022-05-03 DIAGNOSIS — F900 Attention-deficit hyperactivity disorder, predominantly inattentive type: Secondary | ICD-10-CM

## 2022-05-03 MED ORDER — AMPHETAMINE-DEXTROAMPHETAMINE 30 MG PO TABS: 1.0000 | ORAL_TABLET | Freq: Two times a day (BID) | ORAL | 0 refills | Status: DC

## 2022-05-03 MED ORDER — OXYCODONE-ACETAMINOPHEN 7.5-325 MG PO TABS: 1.0000 | ORAL_TABLET | Freq: Two times a day (BID) | ORAL | 0 refills | Status: DC

## 2022-05-03 NOTE — Telephone Encounter (Signed)
Medication Refill - Medication: oxyCODONE-acetaminophen (ENDOCET) 7.5-325 MG tablet amphetamine-dextroamphetamine (ADDERALL) 30 MG tablet   Has the patient contacted their pharmacy? Yes.    (Agent: If yes, when and what did the pharmacy advise?) Contact Provider   Preferred Pharmacy (with phone number or street name): [place], [place]  Has the patient been seen for an appointment in the last year OR does the patient have an upcoming appointment? Yes.    Agent: Please be advised that RX refills may take up to 3 business days. We ask that you follow-up with your pharmacy.   Patient will be out of medication on 05/04/22.

## 2022-05-03 NOTE — Telephone Encounter (Signed)
Requested medication (s) are due for refill today: yes  Requested medication (s) are on the active medication list: yes  Last refill:  both meds last reordered on 04/02/22 #60 each  Future visit scheduled: yes  Notes to clinic:  both meds not delegated to NT to RF   Requested Prescriptions  Pending Prescriptions Disp Refills   amphetamine-dextroamphetamine (ADDERALL) 30 MG tablet 60 tablet 0    Sig: Take 1 tablet by mouth 2 (two) times daily.     Not Delegated - Psychiatry:  Stimulants/ADHD Failed - 05/03/2022 10:29 AM      Failed - This refill cannot be delegated      Failed - Urine Drug Screen completed in last 360 days      Passed - Last BP in normal range    BP Readings from Last 1 Encounters:  12/07/21 112/63         Passed - Last Heart Rate in normal range    Pulse Readings from Last 1 Encounters:  12/07/21 68         Passed - Valid encounter within last 6 months    Recent Outpatient Visits           4 months ago Chronic idiopathic constipation   [place] [name], FNP   5 months ago Chronic pansinusitis   [place] Mikey Kirschner, PA-C   7 months ago Chronic pansinusitis   [facility] [name], FNP   10 months ago Neck pain, chronic   [place] [name], [place]   11 months ago Chronic pansinusitis   [facility] [name], FNP       Future Appointments             In 1 month [name], [place], [facility]             oxyCODONE-acetaminophen (ENDOCET) 7.5-325 MG tablet 60 tablet 0    Sig: Take 1 tablet by mouth 2 (two) times daily.     Not Delegated - Analgesics:  Opioid Agonist Combinations Failed - 05/03/2022 10:29 AM      Failed - This refill cannot be delegated      Failed - Urine Drug Screen completed in last 360 days      Failed - Valid encounter  within last 3 months    Recent Outpatient Visits           4 months ago Chronic idiopathic constipation   [place] [name], FNP   5 months ago Chronic pansinusitis   [place] Mikey Kirschner, PA-C   7 months ago Chronic pansinusitis   [facility] [name], FNP   10 months ago Neck pain, chronic   [place] [name], [place]   11 months ago Chronic pansinusitis   [facility] [name], FNP       Future Appointments             In 1 month Rollene Rotunda, Jaci Standard, [place], [facility]

## 2022-05-14 DIAGNOSIS — J019 Acute sinusitis, unspecified: Secondary | ICD-10-CM | POA: Diagnosis not present

## 2022-05-14 DIAGNOSIS — B9689 Other specified bacterial agents as the cause of diseases classified elsewhere: Secondary | ICD-10-CM | POA: Diagnosis not present

## 2022-05-30 ENCOUNTER — Other Ambulatory Visit: Payer: Self-pay | Admitting: Family Medicine

## 2022-05-30 DIAGNOSIS — F900 Attention-deficit hyperactivity disorder, predominantly inattentive type: Secondary | ICD-10-CM

## 2022-05-30 DIAGNOSIS — M778 Other enthesopathies, not elsewhere classified: Secondary | ICD-10-CM

## 2022-05-30 NOTE — Telephone Encounter (Signed)
Medication Refill - Medication:  amphetamine-dextroamphetamine (ADDERALL) 30 MG tablet  oxyCODONE-acetaminophen (ENDOCET) 7.5-325 MG tablet  Has the patient contacted their pharmacy? No. (Agent: If no, request that the patient contact the pharmacy for the refill. If patient does not wish to contact the pharmacy document the reason why and proceed with request.) (Agent: If yes, when and what did the pharmacy advise?)  Preferred Pharmacy (with phone number or street name):  [place], [place] Phone: [phone]  Fax: [phone]     Has the patient been seen for an appointment in the last year OR does the patient have an upcoming appointment? Yes.    Agent: Please be advised that RX refills may take up to 3 business days. We ask that you follow-up with your pharmacy.

## 2022-05-31 NOTE — Telephone Encounter (Signed)
Requested medications are due for refill today.  Provider to determine  Requested medications are on the active medications list.  yes  Last refill. 05/03/2022 #60 0 rf for both meds  Future visit scheduled.   yes  Notes to clinic.  Refills not delegated.    Requested Prescriptions  Pending Prescriptions Disp Refills   amphetamine-dextroamphetamine (ADDERALL) 30 MG tablet 60 tablet 0    Sig: Take 1 tablet by mouth 2 (two) times daily.     Not Delegated - Psychiatry:  Stimulants/ADHD Failed - 05/30/2022  2:48 PM      Failed - This refill cannot be delegated      Failed - Urine Drug Screen completed in last 360 days      Passed - Last BP in normal range    BP Readings from Last 1 Encounters:  12/07/21 112/63         Passed - Last Heart Rate in normal range    Pulse Readings from Last 1 Encounters:  12/07/21 68         Passed - Valid encounter within last 6 months    Recent Outpatient Visits           5 months ago Chronic idiopathic constipation   [facility] [name], FNP   6 months ago Chronic pansinusitis   [place] Mikey Kirschner, PA-C   8 months ago Chronic pansinusitis   [facility] [name], FNP   11 months ago Neck pain, chronic   [place] [name], [place]   12 months ago Chronic pansinusitis   [facility] [name], FNP       Future Appointments             In 1 week [name], [place], [facility]             oxyCODONE-acetaminophen (ENDOCET) 7.5-325 MG tablet 60 tablet 0    Sig: Take 1 tablet by mouth 2 (two) times daily.     Not Delegated - Analgesics:  Opioid Agonist Combinations Failed - 05/30/2022  2:48 PM      Failed - This refill cannot be delegated      Failed - Urine Drug Screen completed in last 360 days      Failed - Valid encounter within  last 3 months    Recent Outpatient Visits           5 months ago Chronic idiopathic constipation   [place] [name], FNP   6 months ago Chronic pansinusitis   [place] Mikey Kirschner, PA-C   8 months ago Chronic pansinusitis   [facility] [name], FNP   11 months ago Neck pain, chronic   [place] [name], FNP   12 months ago Chronic pansinusitis   [facility] [name], FNP       Future Appointments             In 1 week [name], [place], [facility]

## 2022-06-02 MED ORDER — AMPHETAMINE-DEXTROAMPHETAMINE 30 MG PO TABS: 1.0000 | ORAL_TABLET | Freq: Two times a day (BID) | ORAL | 0 refills | Status: DC

## 2022-06-02 MED ORDER — OXYCODONE-ACETAMINOPHEN 7.5-325 MG PO TABS: 1.0000 | ORAL_TABLET | Freq: Two times a day (BID) | ORAL | 0 refills | Status: DC

## 2022-06-07 ENCOUNTER — Ambulatory Visit: Payer: Medicare Other | Admitting: Family Medicine

## 2022-06-07 NOTE — Progress Notes (Signed)
I,Sha'taria Tyson,acting as a Neurosurgeon for [name], FNP.,have documented all relevant documentation on the behalf of [name], FNP,as directed by  [name], FNP while in the presence of [name], FNP.   Complete physical exam   Patient: Kathleen Buchanan   DOB: 14-Oct-1965   57 y.o. Female  MRN: [id number] Visit Date: 06/10/2022  Today's healthcare provider: [name], FNP  Re Introduced to nurse practitioner role and practice setting.  All questions answered.  Discussed provider/patient relationship and expectations.  Subjective    Kathleen Buchanan is a 57 y.o. female who presents today for a complete physical exam.  She reports consuming a low sodium and low carb  diet. The patient does not participate in regular exercise at present. She generally feels well. She reports sleeping poorly. She does not have additional problems to discuss today.  HPI  -colonoscopy or cologuard: cologuard -mammogram: yes -covid vaccine: declined -Previously saw ENT in [place] and provider is no longer there but patient continues to have problem with recurrent sinus infections. Reports currently having one.    Past Medical History:  Diagnosis Date   Asthma    COPD (chronic obstructive pulmonary disease)    Past Surgical History:  Procedure Laterality Date   ABLATION  2011   BREAST REDUCTION SURGERY Bilateral 08/1991   CESAREAN SECTION     X 2   REDUCTION MAMMAPLASTY Bilateral 1990?   TUBAL LIGATION  1999   Social History   Socioeconomic History   Marital status: Divorced    Spouse name: Not on file   Number of children: 2   Years of education: Not on file   Highest education level: 11th grade  Occupational History   Occupation: disability  Tobacco Use   Smoking status: Every Day    Packs/day: .25    Types: Cigarettes   Smokeless tobacco: Never  Vaping Use   Vaping Use: Never used  Substance and Sexual Activity   Alcohol use: No    Alcohol/week: 0.0  standard drinks of alcohol   Drug use: No   Sexual activity: Not on file  Other Topics Concern   Not on file  Social History Narrative   Not on file   Social Determinants of Health   Financial Resource Strain: Low Risk  (07/05/2021)   Overall Financial Resource Strain (CARDIA)    Difficulty of Paying Living Expenses: Not very hard  Food Insecurity: No Food Insecurity (07/05/2021)   Hunger Vital Sign    Worried About Running Out of Food in the Last Year: Never true    Ran Out of Food in the Last Year: Never true  Transportation Needs: No Transportation Needs (07/05/2021)   PRAPARE - Administrator, Civil Service (Medical): No    Lack of Transportation (Non-Medical): No  Physical Activity: Inactive (07/05/2021)   Exercise Vital Sign    Days of Exercise per Week: 0 days    Minutes of Exercise per Session: 0 min  Stress: No Stress Concern Present (07/05/2021)   [employer] of Occupational Health - Occupational Stress Questionnaire    Feeling of Stress : Only a little  Social Connections: Socially Isolated (07/05/2021)   Social Connection and Isolation Panel [NHANES]    Frequency of Communication with Friends and Family: More than three times a week    Frequency of Social Gatherings with Friends and Family: Once a week    Attends Religious Services: Never  Active Member of Clubs or Organizations: No    Attends Banker Meetings: Never    Marital Status: Divorced  Catering manager Violence: Not At Risk (07/05/2021)   Humiliation, Afraid, Rape, and Kick questionnaire    Fear of Current or Ex-Partner: No    Emotionally Abused: No    Physically Abused: No    Sexually Abused: No   Family Status  Relation Name Status   Father  Deceased   Mat Aunt  Alive   Mother  Alive   MGM  (Not Specified)   MGF  (Not Specified)   Daughter  Alive       tumor in her pitutary gland   Family History  Problem Relation Age of Onset   Lung cancer Father    Ulcers  Father    Cirrhosis Father    Colon polyps Maternal Aunt    Ulcers Mother    Colon polyps Mother    Diabetes Mother    Hyperlipidemia Mother    Aneurysm Maternal Grandmother    Throat cancer Maternal Grandfather    Allergies  Allergen Reactions   Macrobid [Nitrofurantoin Macrocrystal] Diarrhea   Codeine Hives   Doxycycline Nausea And Vomiting   Erythromycin    Hydrocodone-Acetaminophen Hives   Lansoprazole    Levofloxacin    Migraine Formula  [Aspirin-Acetaminophen-Caffeine]    Morphine Sulfate    Omeprazole    Pepcid  [Famotidine]    Sulfa Antibiotics     Patient Care Team: [name], FNP as PCP - General (Family Medicine) Lucita Ferrara, MD as Referring Physician (Orthopedic Surgery)   Medications: Outpatient Medications Prior to Visit  Medication Sig   albuterol (VENTOLIN HFA) 108 (90 Base) MCG/ACT inhaler INHALE 2 PUFFS INTO THE LUNGS EVERY 4 HOURS AS NEEDED FOR WHEEZE OR FOR SHORTNESS OF BREATH   amphetamine-dextroamphetamine (ADDERALL) 30 MG tablet Take 1 tablet by mouth 2 (two) times daily.   Aspirin-Caffeine (BC FAST PAIN RELIEF PO) Take by mouth as needed.   azelastine (ASTELIN) 0.1 % nasal spray Place 1 spray into both nostrils 2 (two) times daily. Use in each nostril as directed   Blood Glucose Monitoring Suppl (ONETOUCH VERIO) w/Device KIT To check blood sugar daily for diabetes   cetirizine (ZYRTEC) 10 MG tablet Take 1 tablet (10 mg total) by mouth daily.   diazepam (VALIUM) 2 MG tablet TAKE 1 TABLET BY MOUTH EVERY 12 HOURS AS NEEDED FOR ANXIETY   EPINEPHrine 0.3 mg/0.3 mL IJ SOAJ injection Inject 0.3 mg into the muscle as needed.   hydrochlorothiazide (HYDRODIURIL) 12.5 MG tablet TAKE 1 TABLET BY MOUTH EVERY DAY   ibuprofen (ADVIL,MOTRIN) 200 MG tablet Take 4 tablets by mouth as needed.   lactulose (CHRONULAC) 10 GM/15ML solution Take 15 mLs (10 g total) by mouth 2 (two) times daily as needed for severe constipation.   Lancets (ONETOUCH ULTRASOFT) lancets  To check blood sugar daily for diabetes   ONETOUCH VERIO test strip TO CHECK BLOOD SUGAR DAILY FOR DIABETES   oxyCODONE-acetaminophen (ENDOCET) 7.5-325 MG tablet Take 1 tablet by mouth 2 (two) times daily.   pantoprazole (PROTONIX) 40 MG tablet TAKE 1 TABLET BY MOUTH EVERY DAY   sodium chloride (OCEAN) 0.65 % SOLN nasal spray Place 1 spray into both nostrils as needed for congestion.   [DISCONTINUED] amoxicillin-clavulanate (AUGMENTIN) 875-125 MG tablet Take 1 tablet by mouth 2 (two) times daily.   [DISCONTINUED] levothyroxine (SYNTHROID) 75 MCG tablet TAKE 1 TABLET BY MOUTH EVERY DAY   [DISCONTINUED] rosuvastatin (  CRESTOR) 10 MG tablet TAKE 1 TABLET BY MOUTH EVERY DAY   [DISCONTINUED] Vitamin D, Ergocalciferol, (DRISDOL) 1.25 MG (50000 UNIT) CAPS capsule Take 1 capsule (50,000 Units total) by mouth every 7 (seven) days.   No facility-administered medications prior to visit.    Review of Systems  Constitutional:  Positive for fatigue.  HENT:  Positive for congestion, ear pain, sinus pressure, sneezing and sore throat.   Gastrointestinal:  Positive for abdominal distention, nausea and vomiting.  Musculoskeletal:  Positive for arthralgias and back pain.  Neurological:  Positive for dizziness and headaches.    Objective    There were no vitals taken for this visit.  Physical Exam Vitals and nursing note reviewed.  Constitutional:      General: She is awake. She is not in acute distress.    Appearance: Normal appearance. She is well-developed and well-groomed. She is obese. She is not ill-appearing, toxic-appearing or diaphoretic.  HENT:     Head: Normocephalic and atraumatic.     Jaw: There is normal jaw occlusion. No trismus, tenderness, swelling or pain on movement.     Right Ear: Hearing, tympanic membrane, ear canal and external ear normal. There is no impacted cerumen.     Left Ear: Hearing, tympanic membrane, ear canal and external ear normal. There is no impacted cerumen.      Nose: Nose normal. No congestion or rhinorrhea.     Right Turbinates: Not enlarged, swollen or pale.     Left Turbinates: Not enlarged, swollen or pale.     Right Sinus: No maxillary sinus tenderness or frontal sinus tenderness.     Left Sinus: No maxillary sinus tenderness or frontal sinus tenderness.     Mouth/Throat:     Lips: Pink.     Mouth: Mucous membranes are moist. No injury.     Tongue: No lesions.     Pharynx: Oropharynx is clear. Uvula midline. No pharyngeal swelling, oropharyngeal exudate, posterior oropharyngeal erythema or uvula swelling.     Tonsils: No tonsillar exudate or tonsillar abscesses.  Eyes:     General: Lids are normal. Lids are everted, no foreign bodies appreciated. Vision grossly intact. Gaze aligned appropriately. No allergic shiner or visual field deficit.       Right eye: No discharge.        Left eye: No discharge.     Extraocular Movements: Extraocular movements intact.     Conjunctiva/sclera: Conjunctivae normal.     Right eye: Right conjunctiva is not injected. No exudate.    Left eye: Left conjunctiva is not injected. No exudate.    Pupils: Pupils are equal, round, and reactive to light.  Neck:     Thyroid: No thyroid mass, thyromegaly or thyroid tenderness.     Vascular: No carotid bruit.     Trachea: Trachea normal.  Cardiovascular:     Rate and Rhythm: Normal rate and regular rhythm.     Pulses: Normal pulses.          Carotid pulses are 2+ on the right side and 2+ on the left side.      Radial pulses are 2+ on the right side and 2+ on the left side.       Dorsalis pedis pulses are 2+ on the right side and 2+ on the left side.       Posterior tibial pulses are 2+ on the right side and 2+ on the left side.     Heart sounds: Normal heart sounds, S1 normal and  S2 normal. No murmur heard.    No friction rub. No gallop.  Pulmonary:     Effort: Pulmonary effort is normal. No respiratory distress.     Breath sounds: Normal breath sounds and air  entry. No stridor. No wheezing, rhonchi or rales.  Chest:     Chest wall: No tenderness.  Abdominal:     General: Abdomen is flat. Bowel sounds are normal. There is no distension.     Palpations: Abdomen is soft. There is no mass.     Tenderness: There is no abdominal tenderness. There is no right CVA tenderness, left CVA tenderness, guarding or rebound.     Hernia: No hernia is present.  Genitourinary:    Comments: Exam deferred; denies complaints Musculoskeletal:        General: No swelling, tenderness, deformity or signs of injury. Normal range of motion.     Cervical back: Full passive range of motion without pain, normal range of motion and neck supple. No edema, rigidity or tenderness. No muscular tenderness.     Right lower leg: No edema.     Left lower leg: No edema.  Lymphadenopathy:     Cervical: No cervical adenopathy.     Right cervical: No superficial, deep or posterior cervical adenopathy.    Left cervical: No superficial, deep or posterior cervical adenopathy.  Skin:    General: Skin is warm and dry.     Capillary Refill: Capillary refill takes less than 2 seconds.     Coloration: Skin is not jaundiced or pale.     Findings: No bruising, erythema, lesion or rash.  Neurological:     General: No focal deficit present.     Mental Status: She is alert and oriented to person, place, and time. Mental status is at baseline.     GCS: GCS eye subscore is 4. GCS verbal subscore is 5. GCS motor subscore is 6.     Sensory: Sensation is intact. No sensory deficit.     Motor: Motor function is intact. No weakness.     Coordination: Coordination is intact. Coordination normal.     Gait: Gait is intact. Gait normal.  Psychiatric:        Attention and Perception: Attention and perception normal.        Mood and Affect: Mood normal. Affect is flat.        Speech: Speech normal.        Behavior: Behavior normal. Behavior is cooperative.        Thought Content: Thought content normal.         Cognition and Memory: Cognition and memory normal.        Judgment: Judgment normal.     Last depression screening scores    06/10/2022    3:08 PM 10/01/2021   10:33 AM 07/05/2021    1:06 PM  PHQ 2/9 Scores  PHQ - 2 Score 0 0 0  PHQ- 9 Score 3 4    Last fall risk screening    06/10/2022    3:08 PM  Fall Risk   Falls in the past year? 0  Number falls in past yr: 0  Injury with Fall? 0  Risk for fall due to : No Fall Risks  Follow up Falls evaluation completed   Last Audit-C alcohol use screening    06/10/2022    3:08 PM  Alcohol Use Disorder Test (AUDIT)  1. How often do you have a drink containing alcohol? 0  2. How many  drinks containing alcohol do you have on a typical day when you are drinking? 0  3. How often do you have six or more drinks on one occasion? 0  AUDIT-C Score 0   A score of 3 or more in women, and 4 or more in men indicates increased risk for alcohol abuse, EXCEPT if all of the points are from question 1   Results for orders placed or performed in visit on 06/10/22  CBC with Differential/Platelet  Result Value Ref Range   WBC 8.4 3.4 - 10.8 x10E3/uL   RBC 3.48 (L) 3.77 - 5.28 x10E6/uL   Hemoglobin 13.5 11.1 - 15.9 g/dL   Hematocrit 40.9 81.1 - 46.6 %   MCV 108 (H) 79 - 97 fL   MCH 38.8 (H) 26.6 - 33.0 pg   MCHC 35.9 (H) 31.5 - 35.7 g/dL   RDW 91.4 78.2 - 95.6 %   Platelets 166 150 - 450 x10E3/uL   Neutrophils 73 Not Estab. %   Lymphs 19 Not Estab. %   Monocytes 3 Not Estab. %   Eos 4 Not Estab. %   Basos 0 Not Estab. %   Neutrophils Absolute 6.1 1.4 - 7.0 x10E3/uL   Lymphocytes Absolute 1.6 0.7 - 3.1 x10E3/uL   Monocytes Absolute 0.3 0.1 - 0.9 x10E3/uL   EOS (ABSOLUTE) 0.3 0.0 - 0.4 x10E3/uL   Basophils Absolute 0.0 0.0 - 0.2 x10E3/uL   Immature Granulocytes 1 Not Estab. %   Immature Grans (Abs) 0.0 0.0 - 0.1 x10E3/uL  Comprehensive Metabolic Panel (CMET)  Result Value Ref Range   Glucose 142 (H) 70 - 99 mg/dL   BUN 14 6 - 24 mg/dL    Creatinine, Ser 2.13 0.57 - 1.00 mg/dL   eGFR 81 >08 MV/HQI/6.96   BUN/Creatinine Ratio 17 9 - 23   Sodium 139 134 - 144 mmol/L   Potassium 4.3 3.5 - 5.2 mmol/L   Chloride 103 96 - 106 mmol/L   CO2 26 20 - 29 mmol/L   Calcium 9.1 8.7 - 10.2 mg/dL   Total Protein 6.4 6.0 - 8.5 g/dL   Albumin 4.2 3.8 - 4.9 g/dL   Globulin, Total 2.2 1.5 - 4.5 g/dL   Albumin/Globulin Ratio 1.9 1.2 - 2.2   Bilirubin Total 0.3 0.0 - 1.2 mg/dL   Alkaline Phosphatase 117 44 - 121 IU/L   AST 15 0 - 40 IU/L   ALT 12 0 - 32 IU/L  Lipid panel  Result Value Ref Range   Cholesterol, Total 224 (H) 100 - 199 mg/dL   Triglycerides 295 (H) 0 - 149 mg/dL   HDL 27 (L) >28 mg/dL   VLDL Cholesterol Cal 51 (H) 5 - 40 mg/dL   LDL Chol Calc (NIH) 413 (H) 0 - 99 mg/dL   Chol/HDL Ratio 8.3 (H) 0.0 - 4.4 ratio  Hemoglobin A1c  Result Value Ref Range   Hgb A1c MFr Bld 5.7 (H) 4.8 - 5.6 %   Est. average glucose Bld gHb Est-mCnc 117 mg/dL  TSH + free T4  Result Value Ref Range   TSH 8.710 (H) 0.450 - 4.500 uIU/mL   Free T4 0.79 (L) 0.82 - 1.77 ng/dL  Urine Microalbumin w/creat. ratio  Result Value Ref Range   Creatinine, Urine WILL FOLLOW    Microalbumin, Urine WILL FOLLOW    Microalb/Creat Ratio WILL FOLLOW   Vitamin D (25 hydroxy)  Result Value Ref Range   Vit D, 25-Hydroxy 20.3 (L) 30.0 - 100.0 ng/mL  POCT  Urinalysis Dipstick  Result Value Ref Range   Color, UA     Clarity, UA     Glucose, UA Negative Negative   Bilirubin, UA negative    Ketones, UA negative    Spec Grav, UA >=1.030 (A) 1.010 - 1.025   Blood, UA negative    pH, UA 6.0 5.0 - 8.0   Protein, UA Negative Negative   Urobilinogen, UA 0.2 0.2 or 1.0 E.U./dL   Nitrite, UA negative    Leukocytes, UA Negative Negative   Appearance     Odor      Assessment & Plan    Routine Health Maintenance and Physical Exam  Exercise Activities and Dietary recommendations  Goals      DIET - EAT MORE FRUITS AND VEGETABLES     Prevent falls     Recommend  to remove any items from the home that may cause slips or trips.     Quit smoking / using tobacco     Recommend to quit smoking. Pt to cut back from 2 cigarettes a day to none.         Immunization History  Administered Date(s) Administered   Tdap 06/23/2019    Health Maintenance  Topic Date Due   COVID-19 Vaccine (1) Never done   Zoster Vaccines- Shingrix (1 of 2) Never done   COLONOSCOPY (Pts 45-56yrs Insurance coverage will need to be confirmed)  Never done   MAMMOGRAM  04/26/2018   Medicare Annual Wellness (AWV)  07/06/2022   HIV Screening  03/11/2026 (Originally 05/02/1980)   INFLUENZA VACCINE  10/10/2022   DTaP/Tdap/Td (2 - Td or Tdap) 06/22/2029   HPV VACCINES  Aged Out   PAP SMEAR-Modifier  Discontinued    Discussed health benefits of physical activity, and encouraged her to engage in regular exercise appropriate for her age and condition.  Problem List Items Addressed This Visit       Respiratory   Chronic pansinusitis    Referral placed to ENT; no exam findings to report. Previously completed a round of Augmentin earlier last month with continued complications of antibiotic yeast infection. Recommend diflucan to assist. Defer repeat ABX given stable presentation.      Relevant Orders   Ambulatory referral to ENT     Other   ADD (attention deficit disorder)   Relevant Orders   Drug Screen 12+Alcohol+CRT, Ur   Annual physical exam - Primary    Continue to recommend vision, dental and dermatology screenings Things to do to keep yourself healthy  - Exercise at least 30-45 minutes a day, 3-4 days a week.  - Eat a low-fat diet with lots of fruits and vegetables, up to 7-9 servings per day.  - Seatbelts can save your life. Wear them always.  - Smoke detectors on every level of your home, check batteries every year.  - Eye Doctor - have an eye exam every 1-2 years  - Safe sex - if you may be exposed to STDs, use a condom.  - Alcohol -  If you drink, do it  moderately, less than 2 drinks per day.  - Health Care Power of Attorney. Choose someone to speak for you if you are not able.  - Depression is common in our stressful world.If you're feeling down or losing interest in things you normally enjoy, please come in for a visit.  - Violence - If anyone is threatening or hurting you, please call immediately.       Relevant Orders  CBC with Differential/Platelet (Completed)   Comprehensive Metabolic Panel (CMET) (Completed)   Lipid panel (Completed)   Hemoglobin A1c (Completed)   TSH + free T4 (Completed)   Urine Microalbumin w/creat. ratio (Completed)   Antibiotic-induced yeast infection    Acute, self limiting Recommend diflucan to assist       Avitaminosis D    Chronic, previously on supplementation. Repeat labs.      Relevant Orders   Vitamin D (25 hydroxy) (Completed)   Breast cancer screening by mammogram    Due for screening for mammogram, denies breast concerns, provided with phone number to call and schedule appointment for mammogram. Encouraged to repeat breast cancer screening every 1-2 years.       Relevant Orders   MM 3D SCREENING MAMMOGRAM BILATERAL BREAST   Chronic prescription benzodiazepine use    Chronic use Will get UDS today to establish baseline Previously on Xanax 1 mg BID- was switched to valium given risk of other medication use and to assist with wean of abortive methods only. Pt is aware of risks of psychoactive medication use to include increased sedation, respiratory suppression, falls, extrapyramidal movements,  dependence and cardiovascular events.  Pt would like to continue treatment as benefit determined to outweigh risk.         Relevant Orders   Drug Screen 12+Alcohol+CRT, Ur   Chronic, continuous use of opioids    Repeat urine tox;  Has been chronically on endocet 7.5 mg BID Pt and spouse are aware of risks of opioid medication use to include increased sedation, respiratory suppression, falls,  dependence and cardiovascular events.  Pt would like to continue treatment as benefit determined to outweigh risk.         Relevant Orders   Drug Screen 12+Alcohol+CRT, Ur   Colon cancer screening    Agreeable to cologuard. Advised kit with come via mail to her home.      Relevant Orders   Cologuard   Current tobacco use    Chronic, stable Continues to defer use of medications to assist with reduction.       Foul smelling urine    Acute, self limiting Normal UA; however, pt reports yeast infection iso recent Abx use      Relevant Orders   POCT Urinalysis Dipstick (Completed)   GAD (generalized anxiety disorder)    Chronic, worsening per pt report. Normal affect during exam, to the opposite extreme Recommend use of buspar BID to assist with follow up to gauge further titration Would benefit from psych referral given multiple medications tried and 'failed'; pt deferred referral at this time      Relevant Medications   busPIRone (BUSPAR) 5 MG tablet   Lack of sexual desire    Acute on chronic, discussed connection between anxiety/depression and sexual desire. Continue to recommend use of open lines of communication to assist and recommend f/u with couples counseling for sex therapy      Return in about 8 weeks (around 08/05/2022) for anxiety and depression.    Leilani Merl, FNP, have reviewed all documentation for this visit. The documentation on 06/11/22 for the exam, diagnosis, procedures, and orders are all accurate and complete.  [name], FNP  [facility] [phone] (phone) [phone] (fax)  [facility]

## 2022-06-10 ENCOUNTER — Ambulatory Visit (INDEPENDENT_AMBULATORY_CARE_PROVIDER_SITE_OTHER): Payer: 59 | Admitting: Family Medicine

## 2022-06-10 DIAGNOSIS — Z0000 Encounter for general adult medical examination without abnormal findings: Secondary | ICD-10-CM | POA: Diagnosis not present

## 2022-06-10 DIAGNOSIS — Z1211 Encounter for screening for malignant neoplasm of colon: Secondary | ICD-10-CM

## 2022-06-10 DIAGNOSIS — E559 Vitamin D deficiency, unspecified: Secondary | ICD-10-CM | POA: Diagnosis not present

## 2022-06-10 DIAGNOSIS — F900 Attention-deficit hyperactivity disorder, predominantly inattentive type: Secondary | ICD-10-CM | POA: Diagnosis not present

## 2022-06-10 DIAGNOSIS — R8290 Unspecified abnormal findings in urine: Secondary | ICD-10-CM | POA: Diagnosis not present

## 2022-06-10 DIAGNOSIS — Z79899 Other long term (current) drug therapy: Secondary | ICD-10-CM

## 2022-06-10 DIAGNOSIS — Z1231 Encounter for screening mammogram for malignant neoplasm of breast: Secondary | ICD-10-CM | POA: Diagnosis not present

## 2022-06-10 DIAGNOSIS — F411 Generalized anxiety disorder: Secondary | ICD-10-CM

## 2022-06-10 DIAGNOSIS — F1190 Opioid use, unspecified, uncomplicated: Secondary | ICD-10-CM

## 2022-06-10 DIAGNOSIS — J324 Chronic pansinusitis: Secondary | ICD-10-CM

## 2022-06-10 DIAGNOSIS — Z720 Tobacco use: Secondary | ICD-10-CM

## 2022-06-10 DIAGNOSIS — F520 Hypoactive sexual desire disorder: Secondary | ICD-10-CM

## 2022-06-10 DIAGNOSIS — B379 Candidiasis, unspecified: Secondary | ICD-10-CM

## 2022-06-10 DIAGNOSIS — T3695XA Adverse effect of unspecified systemic antibiotic, initial encounter: Secondary | ICD-10-CM

## 2022-06-10 LAB — POCT URINALYSIS DIPSTICK
Bilirubin, UA: NEGATIVE
Blood, UA: NEGATIVE
Glucose, UA: NEGATIVE
Ketones, UA: NEGATIVE
Leukocytes, UA: NEGATIVE
Nitrite, UA: NEGATIVE
Protein, UA: NEGATIVE
Spec Grav, UA: >=1.03 — AB (ref 1.010–1.025)
Urobilinogen, UA: 0.2 E.U./dL
pH, UA: 6 (ref 5.0–8.0)

## 2022-06-10 MED ORDER — BUSPIRONE HCL 5 MG PO TABS: 5.0000 mg | ORAL_TABLET | Freq: Three times a day (TID) | ORAL | 0 refills | Status: DC | PRN

## 2022-06-10 MED ORDER — FLUCONAZOLE 150 MG PO TABS
150.0000 mg | ORAL_TABLET | Freq: Once | ORAL | 0 refills | Status: AC
Start: 2022-06-10 — End: 2022-06-10

## 2022-06-10 NOTE — Patient Instructions (Signed)
[email or web address]  Utilize filters to -topic -insurance  Please call and schedule your mammogram:  [place] [facility]  [address], [place],  [place]  [id number] Get Driving Directions Main: [phone]  Sunday:Closed Monday:7:20 AM - 5:00 PM Tuesday:7:20 AM - 5:00 PM Wednesday:7:20 AM - 5:00 PM Thursday:7:20 AM - 5:00 PM Friday:7:20 AM - 4:30 PM Saturday:Closed

## 2022-06-11 ENCOUNTER — Telehealth (INDEPENDENT_AMBULATORY_CARE_PROVIDER_SITE_OTHER): Payer: 59 | Admitting: Physician Assistant

## 2022-06-11 ENCOUNTER — Encounter: Payer: Self-pay | Admitting: Family Medicine

## 2022-06-11 ENCOUNTER — Telehealth: Payer: Self-pay

## 2022-06-11 ENCOUNTER — Encounter: Payer: Self-pay | Admitting: Physician Assistant

## 2022-06-11 ENCOUNTER — Other Ambulatory Visit: Payer: Self-pay | Admitting: Family Medicine

## 2022-06-11 ENCOUNTER — Ambulatory Visit: Payer: Self-pay

## 2022-06-11 ENCOUNTER — Other Ambulatory Visit: Payer: Self-pay

## 2022-06-11 DIAGNOSIS — R7989 Other specified abnormal findings of blood chemistry: Secondary | ICD-10-CM

## 2022-06-11 DIAGNOSIS — K529 Noninfective gastroenteritis and colitis, unspecified: Secondary | ICD-10-CM

## 2022-06-11 LAB — CBC WITH DIFFERENTIAL/PLATELET
Basophils Absolute: 0 10*3/uL (ref 0.0–0.2)
Basos: 0 %
EOS (ABSOLUTE): 0.3 10*3/uL (ref 0.0–0.4)
Eos: 4 %
Hematocrit: 37.6 % (ref 34.0–46.6)
Hemoglobin: 13.5 g/dL (ref 11.1–15.9)
Immature Grans (Abs): 0 10*3/uL (ref 0.0–0.1)
Immature Granulocytes: 1 %
Lymphocytes Absolute: 1.6 10*3/uL (ref 0.7–3.1)
Lymphs: 19 %
MCH: 38.8 pg — ABNORMAL HIGH (ref 26.6–33.0)
MCHC: 35.9 g/dL — ABNORMAL HIGH (ref 31.5–35.7)
MCV: 108 fL — ABNORMAL HIGH (ref 79–97)
Monocytes Absolute: 0.3 10*3/uL (ref 0.1–0.9)
Monocytes: 3 %
Neutrophils Absolute: 6.1 10*3/uL (ref 1.4–7.0)
Neutrophils: 73 %
Platelets: 166 10*3/uL (ref 150–450)
RBC: 3.48 x10E6/uL — ABNORMAL LOW (ref 3.77–5.28)
RDW: 14.8 % (ref 11.7–15.4)
WBC: 8.4 10*3/uL (ref 3.4–10.8)

## 2022-06-11 LAB — COMPREHENSIVE METABOLIC PANEL
ALT: 12 IU/L (ref 0–32)
AST: 15 IU/L (ref 0–40)
Albumin/Globulin Ratio: 1.9 (ref 1.2–2.2)
Albumin: 4.2 g/dL (ref 3.8–4.9)
Alkaline Phosphatase: 117 IU/L (ref 44–121)
BUN/Creatinine Ratio: 17 (ref 9–23)
BUN: 14 mg/dL (ref 6–24)
Bilirubin Total: 0.3 mg/dL (ref 0.0–1.2)
CO2: 26 mmol/L (ref 20–29)
Calcium: 9.1 mg/dL (ref 8.7–10.2)
Chloride: 103 mmol/L (ref 96–106)
Creatinine, Ser: 0.84 mg/dL (ref 0.57–1.00)
Globulin, Total: 2.2 g/dL (ref 1.5–4.5)
Glucose: 142 mg/dL — ABNORMAL HIGH (ref 70–99)
Potassium: 4.3 mmol/L (ref 3.5–5.2)
Sodium: 139 mmol/L (ref 134–144)
Total Protein: 6.4 g/dL (ref 6.0–8.5)
eGFR: 81 mL/min/{1.73_m2} (ref >59)

## 2022-06-11 LAB — TSH+FREE T4
Free T4: 0.79 ng/dL — ABNORMAL LOW (ref 0.82–1.77)
TSH: 8.71 u[IU]/mL — ABNORMAL HIGH (ref 0.450–4.500)

## 2022-06-11 LAB — DRUG SCREEN 12+ALCOHOL+CRT, UR
Amphetamines, Urine: NEGATIVE ng/mL
BENZODIAZ UR QL: NEGATIVE ng/mL
Barbiturate: NEGATIVE ng/mL
Cannabinoids: NEGATIVE ng/mL
Cocaine (Metabolite): NEGATIVE ng/mL
Creatinine, Urine: 119.3 mg/dL (ref 20.0–300.0)
Ethanol, Urine: NEGATIVE %
Meperidine: NEGATIVE ng/mL
Methadone: NEGATIVE ng/mL
OPIATE SCREEN URINE: NEGATIVE ng/mL
Oxycodone/Oxymorphone, Urine: NEGATIVE ng/mL
Phencyclidine: NEGATIVE ng/mL
Propoxyphene: NEGATIVE ng/mL
Tramadol: NEGATIVE ng/mL

## 2022-06-11 LAB — LIPID PANEL
Chol/HDL Ratio: 8.3 ratio — ABNORMAL HIGH (ref 0.0–4.4)
Cholesterol, Total: 224 mg/dL — ABNORMAL HIGH (ref 100–199)
HDL: 27 mg/dL — ABNORMAL LOW (ref >39)
LDL Chol Calc (NIH): 146 mg/dL — ABNORMAL HIGH (ref 0–99)
Triglycerides: 278 mg/dL — ABNORMAL HIGH (ref 0–149)
VLDL Cholesterol Cal: 51 mg/dL — ABNORMAL HIGH (ref 5–40)

## 2022-06-11 LAB — VITAMIN D 25 HYDROXY (VIT D DEFICIENCY, FRACTURES): Vit D, 25-Hydroxy: 20.3 ng/mL — ABNORMAL LOW (ref 30.0–100.0)

## 2022-06-11 LAB — SPECIMEN STATUS REPORT

## 2022-06-11 LAB — MICROALBUMIN / CREATININE URINE RATIO
Creatinine, Urine: 121.1 mg/dL
Microalb/Creat Ratio: 18 mg/g creat (ref 0–29)
Microalbumin, Urine: 21.3 ug/mL

## 2022-06-11 LAB — HEMOGLOBIN A1C
Est. average glucose Bld gHb Est-mCnc: 117 mg/dL
Hgb A1c MFr Bld: 5.7 % — ABNORMAL HIGH (ref 4.8–5.6)

## 2022-06-11 MED ORDER — VITAMIN D (ERGOCALCIFEROL) 1.25 MG (50000 UNIT) PO CAPS: 50000.0000 [IU] | ORAL_CAPSULE | ORAL | 3 refills | Status: DC

## 2022-06-11 MED ORDER — LEVOTHYROXINE SODIUM 100 MCG PO TABS: 100.0000 ug | ORAL_TABLET | Freq: Every day | ORAL | 0 refills | Status: DC

## 2022-06-11 MED ORDER — EZETIMIBE 10 MG PO TABS: 10.0000 mg | ORAL_TABLET | Freq: Every day | ORAL | 3 refills | Status: DC

## 2022-06-11 NOTE — Assessment & Plan Note (Signed)
Continue to recommend vision, dental and dermatology screenings Things to do to keep yourself healthy  - Exercise at least 30-45 minutes a day, 3-4 days a week.  - Eat a low-fat diet with lots of fruits and vegetables, up to 7-9 servings per day.  - Seatbelts can save your life. Wear them always.  - Smoke detectors on every level of your home, check batteries every year.  - Eye Doctor - have an eye exam every 1-2 years  - Safe sex - if you may be exposed to STDs, use a condom.  - Alcohol -  If you drink, do it moderately, less than 2 drinks per day.  - [place]. Choose someone to speak for you if you are not able.  - Depression is common in our stressful world.If you're feeling down or losing interest in things you normally enjoy, please come in for a visit.  - Violence - If anyone is threatening or hurting you, please call immediately.

## 2022-06-11 NOTE — Telephone Encounter (Signed)
Left message for patient to call back for precharting of video visit.

## 2022-06-11 NOTE — Assessment & Plan Note (Signed)
Chronic, worsening per pt report. Normal affect during exam, to the opposite extreme Recommend use of buspar BID to assist with follow up to gauge further titration Would benefit from psych referral given multiple medications tried and 'failed'; pt deferred referral at this time

## 2022-06-11 NOTE — Telephone Encounter (Signed)
  Chief Complaint: Vomiting for 3.5 days. Symptoms: above Frequency: 3.5 days Pertinent Negatives: Patient denies Abdominal pain fever, diarrhea Disposition: [] ED /[] Urgent Care (no appt availability in office) / [] Appointment(In office/virtual)/ []  [place] [facility]/ [] Home Care/ [x] Refused Recommended Disposition /[] [place] [facility]/ []  Follow-up with PCP Additional Notes: PT states that she has had vomiting for the past 3.5 days. She not been able to keep anything down. PT states she mentioned this yesterday but problem was not addressed. PT refuses ED, and will take a virtual visit.     Summary: Vomiting   Pt says she has been vomiting for three and a half days now, seeking Rx to relieve her.  [place], [place] - [address] [place] [address] Phone: [phone] Fax: [phone]     Reason for Disposition  [1] SEVERE vomiting (e.g., 6 or more times/day) AND [2] present > 8 hours (Exception: Patient sounds well, is drinking liquids, does not sound dehydrated, and vomiting has lasted less than 24 hours.)  Answer Assessment - Initial Assessment Questions 1. VOMITING SEVERITY: "How many times have you vomited in the past 24 hours?"     - MILD:  1 - 2 times/day    - MODERATE: 3 - 5 times/day, decreased oral intake without significant weight loss or symptoms of dehydration    - SEVERE: 6 or more times/day, vomits everything or nearly everything, with significant weight loss, symptoms of dehydration      6+ 2. ONSET: "When did the vomiting begin?"      3.5 day ago 3. FLUIDS: "What fluids or food have you vomited up today?" "Have you been able to keep any fluids down?"     everything 4. ABDOMEN PAIN: "Are your having any abdomen pain?" If Yes : "How bad is it and what does it feel like?" (e.g., crampy, dull, intermittent, constant)      no 5. DIARRHEA: "Is there any diarrhea?" If Yes, ask: "How many times today?"      no 6. CONTACTS:  "Is there anyone else in the family with the same symptoms?"      no 7. CAUSE: "What do you think is causing your vomiting?"     no 8. HYDRATION STATUS: "Any signs of dehydration?" (e.g., dry mouth [not only dry lips], too weak to stand) "When did you last urinate?"     A little dehydrated 9. OTHER SYMPTOMS: "Do you have any other symptoms?" (e.g., fever, headache, vertigo, vomiting blood or coffee grounds, recent head injury)     no  Protocols used: Vomiting-A-AH

## 2022-06-11 NOTE — Assessment & Plan Note (Signed)
Chronic, previously on supplementation Repeat labs 

## 2022-06-11 NOTE — Progress Notes (Signed)
Recommend wean off both cognitive support and opioids given not present on UDS despite monthly fills and daily dosing.

## 2022-06-11 NOTE — Progress Notes (Addendum)
Virtual telephone visit    Virtual Visit via Telephone Note   This format is felt to be most appropriate for this patient at this time. The patient did not have access to video technology or had technical difficulties with video requiring transitioning to audio format only (telephone). Physical exam was limited to content and character of the telephone converstion.    Patient location: home Provider location: office  I discussed the limitations of evaluation and management by telemedicine and the availability of in person appointments. The patient expressed understanding and agreed to proceed.  Patient: Kathleen Buchanan   DOB: 01-Feb-1966   57 y.o. Female  MRN: [id number] Visit Date: 06/11/2022  Today's healthcare provider: Debera Lat, PA-C   Chief Complaint  Patient presents with   Emesis   Subjective    Emesis     Patient is a 57 year old female who presents via video visit for evaluation of vomiting.  She states her symptoms started 3.5 days ago. Reports that she has vomit only once today because she hasn't eaten anything. She is pushing fluids (an ounce) every 3 minutes. Vomits every time she eats or drinks.  She just got a call that her grandson was diagnosed with a GI bug and has symptoms of vomiting and diarrhea.  Patient was with grandson on Saturday. Vomit once today, Admits that she is doing better than 3 days ago.    Medications: Outpatient Medications Prior to Visit  Medication Sig   albuterol (VENTOLIN HFA) 108 (90 Base) MCG/ACT inhaler INHALE 2 PUFFS INTO THE LUNGS EVERY 4 HOURS AS NEEDED FOR WHEEZE OR FOR SHORTNESS OF BREATH   amphetamine-dextroamphetamine (ADDERALL) 30 MG tablet Take 1 tablet by mouth 2 (two) times daily.   Aspirin-Caffeine (BC FAST PAIN RELIEF PO) Take by mouth as needed.   azelastine (ASTELIN) 0.1 % nasal spray Place 1 spray into both nostrils 2 (two) times daily. Use in each nostril as directed   Blood Glucose Monitoring Suppl (ONETOUCH  VERIO) w/Device KIT To check blood sugar daily for diabetes   busPIRone (BUSPAR) 5 MG tablet Take 1 tablet (5 mg total) by mouth 3 (three) times daily as needed.   cetirizine (ZYRTEC) 10 MG tablet Take 1 tablet (10 mg total) by mouth daily.   diazepam (VALIUM) 2 MG tablet TAKE 1 TABLET BY MOUTH EVERY 12 HOURS AS NEEDED FOR ANXIETY   EPINEPHrine 0.3 mg/0.3 mL IJ SOAJ injection Inject 0.3 mg into the muscle as needed.   ezetimibe (ZETIA) 10 MG tablet Take 1 tablet (10 mg total) by mouth daily.   hydrochlorothiazide (HYDRODIURIL) 12.5 MG tablet TAKE 1 TABLET BY MOUTH EVERY DAY   ibuprofen (ADVIL,MOTRIN) 200 MG tablet Take 4 tablets by mouth as needed.   lactulose (CHRONULAC) 10 GM/15ML solution Take 15 mLs (10 g total) by mouth 2 (two) times daily as needed for severe constipation.   Lancets (ONETOUCH ULTRASOFT) lancets To check blood sugar daily for diabetes   levothyroxine (SYNTHROID) 100 MCG tablet Take 1 tablet (100 mcg total) by mouth daily.   ONETOUCH VERIO test strip TO CHECK BLOOD SUGAR DAILY FOR DIABETES   oxyCODONE-acetaminophen (ENDOCET) 7.5-325 MG tablet Take 1 tablet by mouth 2 (two) times daily.   pantoprazole (PROTONIX) 40 MG tablet TAKE 1 TABLET BY MOUTH EVERY DAY   sodium chloride (OCEAN) 0.65 % SOLN nasal spray Place 1 spray into both nostrils as needed for congestion.   Vitamin D, Ergocalciferol, (DRISDOL) 1.25 MG (50000 UNIT) CAPS capsule Take  1 capsule (50,000 Units total) by mouth every 7 (seven) days.   No facility-administered medications prior to visit.    Review of Systems  Gastrointestinal:  Positive for vomiting.   Constitutional:  Negative for fever.  Gastrointestinal:  Positive for abdominal pain, nausea and vomiting. Negative for blood in stool, constipation and diarrhea.     Objective    There were no vitals taken for this visit.  Physical Exam Psychiatric:        Behavior: Behavior normal.        Thought Content: Thought content normal.         Judgment: Judgment normal.       Assessment & Plan     Gastroenteritis Acute, improving Pt was educated that: General measures prioritize the prevention and treatment of dehydration. Advised to.Drink plenty of fluids and take small but frequent sips until emesis subsides. Follow a clear liquid diet and drink an oral rehydration solution (ORS) that contains glucose and sodium. Clear liquids include water, ginger ale, Gatorade, etc.,. A home- or clinic-made solution for adults containing approximately 1/2 teaspoon of salt and 6 teaspoons of sugar in 1 liter of water approximates a reduced osmolarity oral rehydration solution (ORS) of 250 Osm/L and is appropriate in nonpediatric patients. A reduced Osm ORS has been shown to reduce the frequency of diarrhea and emesis and a reduced need for IV rehydration therapy compared to conventional ORS therapy. ORS therapy must contain a mixture of salt and glucose in order to prevent hyponatremia.  Early resumption of feeding with a normal diet decreases duration of illness. A BRAT diet containing bananas, rice, applesauce, and toast is frequently recommended but has little supporting evidence.  No follow-ups on file.   I discussed the assessment and treatment plan with the patient. The patient was provided an opportunity to ask questions and all were answered. The patient agreed with the plan and demonstrated an understanding of the instructions.   The patient was advised to call back or seek an in-person evaluation if the symptoms worsen or if the condition fails to improve as anticipated.  I provided 15 minutes of non-face-to-face time during this encounter.  I, Debera Lat, PA-C have reviewed all documentation for this visit. The documentation on  06/11/22 for the exam, diagnosis, procedures, and orders are all accurate and complete.  Debera Lat, [facility], MMS [facility] [phone] (phone) [phone] (fax)   [facility]

## 2022-06-11 NOTE — Telephone Encounter (Signed)
Summary: Vomiting   Pt says she has been vomiting for three and a half days now, seeking Rx to relieve her.  [place], [place] - [place] [address] Phone: [phone] Fax: [phone]      Called pt - left message on machine to call back.

## 2022-06-11 NOTE — Assessment & Plan Note (Signed)
Agreeable to cologuard. Advised kit with come via mail to her home.

## 2022-06-11 NOTE — Assessment & Plan Note (Signed)
Due for screening for mammogram, denies breast concerns, provided with phone number to call and schedule appointment for mammogram. Encouraged to repeat breast cancer screening every 1-2 years.  

## 2022-06-11 NOTE — Progress Notes (Signed)
Cholesterol remains elevated and slightly worse. The 10-year ASCVD risk score (Arnett DK, et al., 2019) is: 10.8%. Recommend trial of zetia given previous complications with statin, crestor.    Values used to calculate the score:     Age: 57 years     Sex: Female     Is Non-Hispanic African American: No     Diabetic: No     Tobacco smoker: Yes     Systolic Blood Pressure: [id number] mmHg     Is BP treated: No     HDL Cholesterol: 27 mg/dL     Total Cholesterol: 224 mg/dL  A1c confirms pre-diabetes remains. Continue to recommend balanced, lower carb meals. Smaller meal size, adding snacks. Choosing water as drink of choice and increasing purposeful exercise.  Thyroid remains over stimulated and low functioning. Recommend medication adjustment and 2 month repeat labs following.  Vit D is improved; however, remains low. Continue to recommend 5000 IU daily OTC vs 50,000 IU weekly Rx.

## 2022-06-11 NOTE — Assessment & Plan Note (Signed)
Acute on chronic, discussed connection between anxiety/depression and sexual desire. Continue to recommend use of open lines of communication to assist and recommend f/u with couples counseling for sex therapy

## 2022-06-11 NOTE — Assessment & Plan Note (Signed)
Acute, self limiting Normal UA; however, pt reports yeast infection iso recent Abx use

## 2022-06-11 NOTE — Assessment & Plan Note (Signed)
Repeat urine tox;  Has been chronically on endocet 7.5 mg BID Pt and spouse are aware of risks of opioid medication use to include increased sedation, respiratory suppression, falls, dependence and cardiovascular events.  Pt would like to continue treatment as benefit determined to outweigh risk.

## 2022-06-11 NOTE — Assessment & Plan Note (Signed)
Acute, self limiting Recommend diflucan to assist  

## 2022-06-11 NOTE — Assessment & Plan Note (Signed)
Chronic use Will get UDS today to establish baseline Previously on Xanax 1 mg BID- was switched to valium given risk of other medication use and to assist with wean of abortive methods only. Pt is aware of risks of psychoactive medication use to include increased sedation, respiratory suppression, falls, extrapyramidal movements,  dependence and cardiovascular events.  Pt would like to continue treatment as benefit determined to outweigh risk.

## 2022-06-11 NOTE — Assessment & Plan Note (Signed)
Referral placed to ENT; no exam findings to report. Previously completed a round of Augmentin earlier last month with continued complications of antibiotic yeast infection. Recommend diflucan to assist. Defer repeat ABX given stable presentation.

## 2022-06-11 NOTE — Assessment & Plan Note (Signed)
Chronic, stable Continues to defer use of medications to assist with reduction.

## 2022-06-12 ENCOUNTER — Other Ambulatory Visit: Payer: Self-pay | Admitting: Family Medicine

## 2022-06-12 ENCOUNTER — Other Ambulatory Visit: Payer: Self-pay

## 2022-06-12 DIAGNOSIS — Z79899 Other long term (current) drug therapy: Secondary | ICD-10-CM

## 2022-06-12 DIAGNOSIS — F900 Attention-deficit hyperactivity disorder, predominantly inattentive type: Secondary | ICD-10-CM

## 2022-06-12 DIAGNOSIS — F1190 Opioid use, unspecified, uncomplicated: Secondary | ICD-10-CM

## 2022-06-12 NOTE — Progress Notes (Signed)
[place], [place] 06/12/2022 11:59 AM EDT Back to Top    Patient aware. Patient reports she had been vomiting for 2 1/2 days before coming to her appt. Per Daneil Dan will repeat UDS in 2 weeks.   [name], FNP 06/11/2022  4:43 PM EDT     Recommend wean off both cognitive support and opioids given not present on UDS despite monthly fills and daily dosing.

## 2022-06-19 ENCOUNTER — Ambulatory Visit: Payer: Self-pay | Admitting: *Deleted

## 2022-06-19 NOTE — Telephone Encounter (Signed)
Left message on vm to call back and schedule an appt.

## 2022-06-19 NOTE — Telephone Encounter (Signed)
Summary: Pt request Rx for antibiotic for sinus problem   Pt stated she was seen on 06/10/22 and she had been taking an antibiotic previously for her sinuses so no other med was sent to her pharmacy. Pt stated the antibiotic that she was taking did not resolve the problem so she would like to request that a Rx for an antibiotic be sent to [facility] [address] [place] - [address]  Cb# [phone]           Chief Complaint: requesting medication, continued sinus issues Symptoms: headache pressure in head face, cheeks, eyes, coughing up green mucus, blowing nose for green mucus. Abdominal discomfort due to drainage per patient. Reports she has been taking antibiotic and was not effective.  Frequency: prior to 06/10/22. Pertinent Negatives: Patient denies fever, no chest pain no difficulty breathing reported . Disposition: [] ED /[] Urgent Care (no appt availability in office) / [] Appointment(In office/virtual)/ []  [place] Virtual Care/ [] Home Care/ [] Refused Recommended Disposition /[] [place] [facility]/ [x]  Follow-up with PCP Additional Notes:   Offered appt to f/u with PCP. Patient reports she has been seen on 06/10/22 and VV  06/11/22 . Please advise if medication can be prescribed or another OV needed. Patient would like a call back.        Reason for Disposition  [1] Sinus congestion (pressure, fullness) AND [2] present > 10 days  Answer Assessment - Initial Assessment Questions 1. LOCATION: "Where does it hurt?"      Sinueses, eyes, cheeks, pressure in head 2. ONSET: "When did the sinus pain start?"  (e.g., hours, days)      Prior to 06/10/22 and progressively worse 3. SEVERITY: "How bad is the pain?"   (Scale 1-10; mild, moderate or severe)   - MILD (1-3): doesn't interfere with normal activities    - MODERATE (4-7): interferes with normal activities (e.g., work or school) or awakens from sleep   - SEVERE (8-10): excruciating pain and patient unable to do any normal  activities        Headaches will not go away. Difficulty to do normal activities.  4. RECURRENT SYMPTOM: "Have you ever had sinus problems before?" If Yes, ask: "When was the last time?" and "What happened that time?"      Yes has taken antibiotics and did not help 5. NASAL CONGESTION: "Is the nose blocked?" If Yes, ask: "Can you open it or must you breathe through your mouth?"     Na  6. NASAL DISCHARGE: "Do you have discharge from your nose?" If so ask, "What color?"     Yes color green  7. FEVER: "Do you have a fever?" If Yes, ask: "What is it, how was it measured, and when did it start?"      no 8. OTHER SYMPTOMS: "Do you have any other symptoms?" (e.g., sore throat, cough, earache, difficulty breathing)     Cough productive green mucus, blowing nose for green mucus, headache with pressure eyes, cheeks, abdominal discomfort due to possible drainage  9. PREGNANCY: "Is there any chance you are pregnant?" "When was your last menstrual period?"     na  Protocols used: Sinus Pain or Congestion-A-AH

## 2022-06-25 ENCOUNTER — Other Ambulatory Visit: Payer: Self-pay | Admitting: Family Medicine

## 2022-06-25 DIAGNOSIS — Z79899 Other long term (current) drug therapy: Secondary | ICD-10-CM | POA: Diagnosis not present

## 2022-06-30 LAB — DRUG SCREEN 12+ALCOHOL+CRT, UR
Amphetamines, Urine: POSITIVE — AB
BENZODIAZ UR QL: NEGATIVE ng/mL
Barbiturate: NEGATIVE ng/mL
Cannabinoids: NEGATIVE ng/mL
Cocaine (Metabolite): NEGATIVE ng/mL
Creatinine, Urine: 190.7 mg/dL (ref 20.0–300.0)
Ethanol, Urine: NEGATIVE %
Meperidine: NEGATIVE ng/mL
Methadone: NEGATIVE ng/mL
OPIATE SCREEN URINE: NEGATIVE ng/mL
Oxycodone/Oxymorphone, Urine: POSITIVE — AB
Phencyclidine: NEGATIVE ng/mL
Propoxyphene: NEGATIVE ng/mL
Tramadol: NEGATIVE ng/mL

## 2022-07-05 ENCOUNTER — Other Ambulatory Visit: Payer: Self-pay | Admitting: Family Medicine

## 2022-07-05 DIAGNOSIS — F900 Attention-deficit hyperactivity disorder, predominantly inattentive type: Secondary | ICD-10-CM

## 2022-07-05 DIAGNOSIS — K219 Gastro-esophageal reflux disease without esophagitis: Secondary | ICD-10-CM

## 2022-07-05 DIAGNOSIS — M778 Other enthesopathies, not elsewhere classified: Secondary | ICD-10-CM

## 2022-07-05 MED ORDER — PANTOPRAZOLE SODIUM 40 MG PO TBEC
40.0000 mg | DELAYED_RELEASE_TABLET | Freq: Every day | ORAL | 3 refills | Status: DC
Start: 2022-07-05 — End: 2023-03-27

## 2022-07-05 NOTE — Telephone Encounter (Signed)
Requested medications are due for refill today.  Provider to determine  Requested medications are on the active medications list.  yes  Last refill. 06/02/2022 #60 0rf for both  Future visit scheduled.   no  Notes to clinic.  Refills are not delegated.    Requested Prescriptions  Pending Prescriptions Disp Refills   oxyCODONE-acetaminophen (ENDOCET) 7.5-325 MG tablet 60 tablet 0    Sig: Take 1 tablet by mouth 2 (two) times daily.     Not Delegated - Analgesics:  Opioid Agonist Combinations Failed - 07/05/2022 11:44 AM      Failed - This refill cannot be delegated      Failed - Urine Drug Screen completed in last 360 days      Passed - Valid encounter within last 3 months    Recent Outpatient Visits           3 weeks ago Gastroenteritis   [facility] [place], [place], PA-C   3 weeks ago Annual physical exam   [facility] Merita Norton T, FNP   7 months ago Chronic idiopathic constipation   [facility] [name], FNP   7 months ago Chronic pansinusitis   [facility] Alfredia Ferguson, PA-C   9 months ago Chronic pansinusitis   [facility] Merita Norton T, FNP               amphetamine-dextroamphetamine (ADDERALL) 30 MG tablet 60 tablet 0    Sig: Take 1 tablet by mouth 2 (two) times daily.     Not Delegated - Psychiatry:  Stimulants/ADHD Failed - 07/05/2022 11:44 AM      Failed - This refill cannot be delegated      Failed - Urine Drug Screen completed in last 360 days      Passed - Last BP in normal range    BP Readings from Last 1 Encounters:  12/07/21 112/63         Passed - Last Heart Rate in normal range    Pulse Readings from Last 1 Encounters:  12/07/21 68         Passed - Valid encounter within last 6 months    Recent Outpatient Visits           3 weeks ago Gastroenteritis   [facility]  [place], [place], PA-C   3 weeks ago Annual physical exam   [facility] Merita Norton T, FNP   7 months ago Chronic idiopathic constipation   [facility] [name], FNP   7 months ago Chronic pansinusitis   [facility] Alfredia Ferguson, PA-C   9 months ago Chronic pansinusitis   [facility] Merita Norton T, [place]              Signed Prescriptions Disp Refills   pantoprazole (PROTONIX) 40 MG tablet 90 tablet 3    Sig: Take 1 tablet (40 mg total) by mouth daily.     Gastroenterology: Proton Pump Inhibitors Passed - 07/05/2022 11:44 AM      Passed - Valid encounter within last 12 months    Recent Outpatient Visits           3 weeks ago Gastroenteritis    [facility] [place], [place], PA-C   3 weeks ago Annual physical exam   [facility] [place],  Daryl [place], FNP   7 months ago Chronic idiopathic constipation   [facility] [name], FNP   7 months ago Chronic pansinusitis   [facility] Alfredia Ferguson, [place]   9 months ago Chronic pansinusitis   [facility] [name], [place]

## 2022-07-05 NOTE — Telephone Encounter (Signed)
Requested Prescriptions  Pending Prescriptions Disp Refills   oxyCODONE-acetaminophen (ENDOCET) 7.5-325 MG tablet 60 tablet 0    Sig: Take 1 tablet by mouth 2 (two) times daily.     Not Delegated - Analgesics:  Opioid Agonist Combinations Failed - 07/05/2022 11:44 AM      Failed - This refill cannot be delegated      Failed - Urine Drug Screen completed in last 360 days      Passed - Valid encounter within last 3 months    Recent Outpatient Visits           3 weeks ago Gastroenteritis   [facility] [place], [place], PA-C   3 weeks ago Annual physical exam   [facility] Merita Norton T, FNP   7 months ago Chronic idiopathic constipation   [facility] [name], FNP   7 months ago Chronic pansinusitis   [facility] Alfredia Ferguson, PA-C   9 months ago Chronic pansinusitis   [facility] Merita Norton T, FNP               amphetamine-dextroamphetamine (ADDERALL) 30 MG tablet 60 tablet 0    Sig: Take 1 tablet by mouth 2 (two) times daily.     Not Delegated - Psychiatry:  Stimulants/ADHD Failed - 07/05/2022 11:44 AM      Failed - This refill cannot be delegated      Failed - Urine Drug Screen completed in last 360 days      Passed - Last BP in normal range    BP Readings from Last 1 Encounters:  12/07/21 112/63         Passed - Last Heart Rate in normal range    Pulse Readings from Last 1 Encounters:  12/07/21 68         Passed - Valid encounter within last 6 months    Recent Outpatient Visits           3 weeks ago Gastroenteritis   [facility] [place], [place], PA-C   3 weeks ago Annual physical exam   [facility] Merita Norton T, FNP   7 months ago Chronic idiopathic constipation   [facility] [name], FNP   7 months ago Chronic pansinusitis    [facility] Alfredia Ferguson, PA-C   9 months ago Chronic pansinusitis   [facility] Merita Norton T, FNP               pantoprazole (PROTONIX) 40 MG tablet 90 tablet 3    Sig: Take 1 tablet (40 mg total) by mouth daily.     Gastroenterology: Proton Pump Inhibitors Passed - 07/05/2022 11:44 AM      Passed - Valid encounter within last 12 months    Recent Outpatient Visits           3 weeks ago Gastroenteritis    [facility] [place], [place], PA-C   3 weeks ago Annual physical exam   [facility] Merita Norton T, FNP   7 months ago Chronic idiopathic constipation   [facility] [name], FNP   7 months ago Chronic pansinusitis   [facility] Alfredia Ferguson, PA-C   9 months ago Chronic pansinusitis   [facility] [place],  Daryl Eastern, FNP

## 2022-07-05 NOTE — Telephone Encounter (Signed)
Medication Refill - Medication: oxyCODONE-acetaminophen (ENDOCET) 7.5-325 MG tablet [[id number]]   amphetamine-dextroamphetamine (ADDERALL) 30 MG tablet [[id number]]   pantoprazole (PROTONIX) 40 MG tablet [[id number]]   Has the patient contacted their pharmacy? Yes.   (Agent: If no, request that the patient contact the pharmacy for the refill. If patient does not wish to contact the pharmacy document the reason why and proceed with request.) (Agent: If yes, when and what did the pharmacy advise?)  Preferred Pharmacy (with phone number or street name):  [facility] [address] [place] - [address] Phone: [phone]  Fax: [phone]     Has the patient been seen for an appointment in the last year OR does the patient have an upcoming appointment? Yes.    Agent: Please be advised that RX refills may take up to 3 business days. We ask that you follow-up with your pharmacy.

## 2022-07-08 MED ORDER — OXYCODONE-ACETAMINOPHEN 7.5-325 MG PO TABS
1.0000 | ORAL_TABLET | Freq: Two times a day (BID) | ORAL | 0 refills | Status: DC
Start: 2022-07-08 — End: 2022-08-06

## 2022-07-08 MED ORDER — AMPHETAMINE-DEXTROAMPHETAMINE 30 MG PO TABS
1.0000 | ORAL_TABLET | Freq: Two times a day (BID) | ORAL | 0 refills | Status: DC
Start: 2022-07-08 — End: 2022-08-08

## 2022-07-10 ENCOUNTER — Ambulatory Visit (INDEPENDENT_AMBULATORY_CARE_PROVIDER_SITE_OTHER): Payer: 59

## 2022-07-10 VITALS — Ht 62.0 in | Wt 202.0 lb

## 2022-07-10 DIAGNOSIS — Z0000 Encounter for general adult medical examination without abnormal findings: Secondary | ICD-10-CM | POA: Diagnosis not present

## 2022-07-10 NOTE — Patient Instructions (Signed)
Ms. Hillebrand , Thank you for taking time to come for your Medicare Wellness Visit. I appreciate your ongoing commitment to your health goals. Please review the following plan we discussed and let me know if I can assist you in the future.   These are the goals we discussed:  Goals      DIET - EAT MORE FRUITS AND VEGETABLES     Prevent falls     Recommend to remove any items from the home that may cause slips or trips.     Quit smoking / using tobacco     Recommend to quit smoking. Pt to cut back from 2 cigarettes a day to none.         This is a list of the screening recommended for you and due dates:  Health Maintenance  Topic Date Due   COVID-19 Vaccine (1) Never done   Zoster (Shingles) Vaccine (1 of 2) Never done   Colon Cancer Screening  Never done   Mammogram  04/26/2018   HIV Screening  03/11/2026*   Flu Shot  10/10/2022   Medicare Annual Wellness Visit  07/10/2023   DTaP/Tdap/Td vaccine (2 - Td or Tdap) 06/22/2029   HPV Vaccine  Aged Out   Pap Smear  Discontinued  *Topic was postponed. The date shown is not the original due date.    Advanced directives: no  Conditions/risks identified: low falls risk  Next appointment: Follow up in one year for your annual wellness visit. 07/15/2023 @1pm  telephone  Preventive Care 40-64 Years, Female Preventive care refers to lifestyle choices and visits with your health care provider that can promote health and wellness. What does preventive care include? A yearly physical exam. This is also called an annual well check. Dental exams once or twice a year. Routine eye exams. Ask your health care provider how often you should have your eyes checked. Personal lifestyle choices, including: Daily care of your teeth and gums. Regular physical activity. Eating a healthy diet. Avoiding tobacco and drug use. Limiting alcohol use. Practicing safe sex. Taking low-dose aspirin daily starting at age 17. Taking vitamin and mineral  supplements as recommended by your health care provider. What happens during an annual well check? The services and screenings done by your health care provider during your annual well check will depend on your age, overall health, lifestyle risk factors, and family history of disease. Counseling  Your health care provider may ask you questions about your: Alcohol use. Tobacco use. Drug use. Emotional well-being. Home and relationship well-being. Sexual activity. Eating habits. Work and work Astron[employer]. Method of birth control. Menstrual cycle. Pregnancy history. Screening  You may have the following tests or measurements: Height, weight, and BMI. Blood pressure. Lipid and cholesterol levels. These may be checked every 5 years, or more frequently if you are over 59 years old. Skin check. Lung cancer screening. You may have this screening every year starting at age 12 if you have a 30-pack-year history of smoking and currently smoke or have quit within the past 15 years. Fecal occult blood test (FOBT) of the stool. You may have this test every year starting at age 73. Flexible sigmoidoscopy or colonoscopy. You may have a sigmoidoscopy every 5 years or a colonoscopy every 10 years starting at age 83. Hepatitis C blood test. Hepatitis B blood test. Sexually transmitted disease (STD) testing. Diabetes screening. This is done by checking your blood sugar (glucose) after you have not eaten for a while (fasting). You may have  this done every 1-3 years. Mammogram. This may be done every 1-2 years. Talk to your health care provider about when you should start having regular mammograms. This may depend on whether you have a family history of breast cancer. BRCA-related cancer screening. This may be done if you have a family history of breast, ovarian, tubal, or peritoneal cancers. Pelvic exam and Pap test. This may be done every 3 years starting at age 15. Starting at age 62, this may be done  every 5 years if you have a Pap test in combination with an HPV test. Bone density scan. This is done to screen for osteoporosis. You may have this scan if you are at high risk for osteoporosis. Discuss your test results, treatment options, and if necessary, the need for more tests with your health care provider. Vaccines  Your health care provider may recommend certain vaccines, such as: Influenza vaccine. This is recommended every year. Tetanus, diphtheria, and acellular pertussis (Tdap, Td) vaccine. You may need a Td booster every 10 years. Zoster vaccine. You may need this after age 27. Pneumococcal 13-valent conjugate (PCV13) vaccine. You may need this if you have certain conditions and were not previously vaccinated. Pneumococcal polysaccharide (PPSV23) vaccine. You may need one or two doses if you smoke cigarettes or if you have certain conditions. Talk to your health care provider about which screenings and vaccines you need and how often you need them. This information is not intended to replace advice given to you by your health care provider. Make sure you discuss any questions you have with your health care provider. Document Released: 03/24/2015 Document Revised: 11/15/2015 Document Reviewed: 12/27/2014 Elsevier Interactive Patient Education  2017 [employer] Prevention in the Home Falls can cause injuries. They can happen to people of all ages. There are many things you can do to make your home safe and to help prevent falls. What can I do on the outside of my home? Regularly fix the edges of walkways and driveways and fix any cracks. Remove anything that might make you trip as you walk through a door, such as a raised step or threshold. Trim any bushes or trees on the path to your home. Use bright outdoor lighting. Clear any walking paths of anything that might make someone trip, such as rocks or tools. Regularly check to see if handrails are loose or broken. Make  sure that both sides of any steps have handrails. Any raised decks and porches should have guardrails on the edges. Have any leaves, snow, or ice cleared regularly. Use sand or salt on walking paths during winter. Clean up any spills in your garage right away. This includes oil or grease spills. What can I do in the bathroom? Use night lights. Install grab bars by the toilet and in the tub and shower. Do not use towel bars as grab bars. Use non-skid mats or decals in the tub or shower. If you need to sit down in the shower, use a plastic, non-slip stool. Keep the floor dry. Clean up any water that spills on the floor as soon as it happens. Remove soap buildup in the tub or shower regularly. Attach bath mats securely with double-sided non-slip rug tape. Do not have throw rugs and other things on the floor that can make you trip. What can I do in the bedroom? Use night lights. Make sure that you have a light by your bed that is easy to reach. Do not use  any sheets or blankets that are too big for your bed. They should not hang down onto the floor. Have a firm chair that has side arms. You can use this for support while you get dressed. Do not have throw rugs and other things on the floor that can make you trip. What can I do in the kitchen? Clean up any spills right away. Avoid walking on wet floors. Keep items that you use a lot in easy-to-reach places. If you need to reach something above you, use a strong step stool that has a grab bar. Keep electrical cords out of the way. Do not use floor polish or wax that makes floors slippery. If you must use wax, use non-skid floor wax. Do not have throw rugs and other things on the floor that can make you trip. What can I do with my stairs? Do not leave any items on the stairs. Make sure that there are handrails on both sides of the stairs and use them. Fix handrails that are broken or loose. Make sure that handrails are as long as the  stairways. Check any carpeting to make sure that it is firmly attached to the stairs. Fix any carpet that is loose or worn. Avoid having throw rugs at the top or bottom of the stairs. If you do have throw rugs, attach them to the floor with carpet tape. Make sure that you have a light switch at the top of the stairs and the bottom of the stairs. If you do not have them, ask someone to add them for you. What else can I do to help prevent falls? Wear shoes that: Do not have high heels. Have rubber bottoms. Are comfortable and fit you well. Are closed at the toe. Do not wear sandals. If you use a stepladder: Make sure that it is fully opened. Do not climb a closed stepladder. Make sure that both sides of the stepladder are locked into place. Ask someone to hold it for you, if possible. Clearly mark and make sure that you can see: Any grab bars or handrails. First and last steps. Where the edge of each step is. Use tools that help you move around (mobility aids) if they are needed. These include: Canes. Walkers. Scooters. Crutches. Turn on the lights when you go into a dark area. Replace any light bulbs as soon as they burn out. Set up your furniture so you have a clear path. Avoid moving your furniture around. If any of your floors are uneven, fix them. If there are any pets around you, be aware of where they are. Review your medicines with your doctor. Some medicines can make you feel dizzy. This can increase your chance of falling. Ask your doctor what other things that you can do to help prevent falls. This information is not intended to replace advice given to you by your health care provider. Make sure you discuss any questions you have with your health care provider. Document Released: 12/22/2008 Document Revised: 08/03/2015 Document Reviewed: 04/01/2014 Elsevier Interactive Patient Education  2017 [employer].

## 2022-07-10 NOTE — Progress Notes (Signed)
I connected with  Bary Castilla Simkins on 07/10/22 by a audio enabled telemedicine application and verified that I am speaking with the correct person using two identifiers.  Patient Location: Home  Provider Location: Office/Clinic  I discussed the limitations of evaluation and management by telemedicine. The patient expressed understanding and agreed to proceed.  Subjective:   Kathleen Buchanan is a 57 y.o. female who presents for Medicare Annual (Subsequent) preventive examination.  Review of Systems    Cardiac Risk Factors include: sedentary lifestyle;obesity (BMI >30kg/m2);smoking/ tobacco exposure    Objective:    Today's Vitals   07/08/22 1307 07/10/22 1305  Weight:  202 lb (91.6 kg)  Height:  5\' 2"  (1.575 m)  PainSc: 4     Body mass index is 36.95 kg/m.     07/10/2022    1:13 PM 07/05/2021    1:08 PM 12/14/2019    2:12 PM 11/25/2018    2:07 PM 11/08/2016    1:41 PM 07/25/2015    7:07 PM 11/08/2014   11:08 AM  Advanced Directives  Does Patient Have a Medical Advance Directive? No No No No No No No  Would patient like information on creating a medical advance directive?  No - Patient declined No - Patient declined No - Patient declined No - Patient declined No - patient declined information     Current Medications (verified) Outpatient Encounter Medications as of 07/10/2022  Medication Sig   albuterol (VENTOLIN HFA) 108 (90 Base) MCG/ACT inhaler INHALE 2 PUFFS INTO THE LUNGS EVERY 4 HOURS AS NEEDED FOR WHEEZE OR FOR SHORTNESS OF BREATH   amphetamine-dextroamphetamine (ADDERALL) 30 MG tablet Take 1 tablet by mouth 2 (two) times daily.   Aspirin-Caffeine (BC FAST PAIN RELIEF PO) Take by mouth as needed.   azelastine (ASTELIN) 0.1 % nasal spray Place 1 spray into both nostrils 2 (two) times daily. Use in each nostril as directed   Blood Glucose Monitoring Suppl (ONETOUCH VERIO) w/Device KIT To check blood sugar daily for diabetes   busPIRone (BUSPAR) 5 MG tablet Take 1 tablet (5 mg  total) by mouth 3 (three) times daily as needed.   cetirizine (ZYRTEC) 10 MG tablet Take 1 tablet (10 mg total) by mouth daily.   diazepam (VALIUM) 2 MG tablet TAKE 1 TABLET BY MOUTH EVERY 12 HOURS AS NEEDED FOR ANXIETY   EPINEPHrine 0.3 mg/0.3 mL IJ SOAJ injection Inject 0.3 mg into the muscle as needed.   ezetimibe (ZETIA) 10 MG tablet Take 1 tablet (10 mg total) by mouth daily.   hydrochlorothiazide (HYDRODIURIL) 12.5 MG tablet TAKE 1 TABLET BY MOUTH EVERY DAY   ibuprofen (ADVIL,MOTRIN) 200 MG tablet Take 4 tablets by mouth as needed.   lactulose (CHRONULAC) 10 GM/15ML solution Take 15 mLs (10 g total) by mouth 2 (two) times daily as needed for severe constipation.   Lancets (ONETOUCH ULTRASOFT) lancets To check blood sugar daily for diabetes   levothyroxine (SYNTHROID) 100 MCG tablet Take 1 tablet (100 mcg total) by mouth daily.   ONETOUCH VERIO test strip TO CHECK BLOOD SUGAR DAILY FOR DIABETES   oxyCODONE-acetaminophen (ENDOCET) 7.5-325 MG tablet Take 1 tablet by mouth 2 (two) times daily.   pantoprazole (PROTONIX) 40 MG tablet Take 1 tablet (40 mg total) by mouth daily.   sodium chloride (OCEAN) 0.65 % SOLN nasal spray Place 1 spray into both nostrils as needed for congestion.   Vitamin D, Ergocalciferol, (DRISDOL) 1.25 MG (50000 UNIT) CAPS capsule Take 1 capsule (50,000 Units total) by mouth  every 7 (seven) days.   No facility-administered encounter medications on file as of 07/10/2022.    Allergies (verified) Macrobid [nitrofurantoin macrocrystal], Codeine, Doxycycline, Erythromycin, Hydrocodone-acetaminophen, Lansoprazole, Levofloxacin, Migraine formula  [aspirin-acetaminophen-caffeine], Morphine sulfate, Omeprazole, Pepcid  [famotidine], and Sulfa antibiotics   History: Past Medical History:  Diagnosis Date   Asthma    COPD (chronic obstructive pulmonary disease) (HCC)    Past Surgical History:  Procedure Laterality Date   ABLATION  2011   BREAST REDUCTION SURGERY Bilateral  08/1991   CESAREAN SECTION     X 2   REDUCTION MAMMAPLASTY Bilateral 1990?   TUBAL LIGATION  1999   Family History  Problem Relation Age of Onset   Lung cancer Father    Ulcers Father    Cirrhosis Father    Colon polyps Maternal Aunt    Ulcers Mother    Colon polyps Mother    Diabetes Mother    Hyperlipidemia Mother    ADD / ADHD Mother    Aneurysm Maternal Grandmother    Throat cancer Maternal Grandfather    Social History   Socioeconomic History   Marital status: Divorced    Spouse name: Not on file   Number of children: 2   Years of education: Not on file   Highest education level: 11th grade  Occupational History   Occupation: disability  Tobacco Use   Smoking status: Every Day    Packs/day: .25    Types: Cigarettes   Smokeless tobacco: Never  Vaping Use   Vaping Use: Never used  Substance and Sexual Activity   Alcohol use: No   Drug use: No   Sexual activity: Not on file  Other Topics Concern   Not on file  Social History Narrative   Not on file   Social Determinants of Health   Financial Resource Strain: Low Risk  (07/05/2021)   Overall Financial Resource Strain (CARDIA)    Difficulty of Paying Living Expenses: Not very hard  Food Insecurity: No Food Insecurity (07/05/2021)   Hunger Vital Sign    Worried About Running Out of Food in the Last Year: Never true    Ran Out of Food in the Last Year: Never true  Transportation Needs: No Transportation Needs (07/08/2022)   PRAPARE - Administrator, Civil Service (Medical): No    Lack of Transportation (Non-Medical): No  Physical Activity: Inactive (07/08/2022)   Exercise Vital Sign    Days of Exercise per Week: 0 days    Minutes of Exercise per Session: 0 min  Stress: No Stress Concern Present (07/08/2022)   [employer] of Occupational Health - Occupational Stress Questionnaire    Feeling of Stress : Only a little  Social Connections: Unknown (07/08/2022)   Social Connection and Isolation  Panel [NHANES]    Frequency of Communication with Friends and Family: Once a week    Frequency of Social Gatherings with Friends and Family: Patient declined    Attends Religious Services: Not on Marketing executive or Organizations: No    Attends Banker Meetings: Never    Marital Status: Divorced    Tobacco Counseling Ready to quit: Not Answered Counseling given: Not Answered   Clinical Intake:  Pre-visit preparation completed: Yes  Pain : 0-10 Pain Score: 4  Pain Location: Neck (left hip) Pain Orientation: Left Pain Descriptors / Indicators: Aching Pain Onset: More than a month ago Pain Frequency: Constant Effect of Pain on Daily Activities: affecting sleep patterns  BMI - recorded: 36.95 Nutritional Status: BMI > 30  Obese Nutritional Risks: Other (Comment)  How often do you need to have someone help you when you read instructions, pamphlets, or other written materials from your doctor or pharmacy?: 1 - Never  Diabetic?no  Interpreter Needed?: No  Comments: lives alone Information entered by :: B>Kristien Salatino,LPN   Activities of Daily Living    07/08/2022    1:07 PM  In your present state of health, do you have any difficulty performing the following activities:  Hearing? 0  Vision? 0  Difficulty concentrating or making decisions? 0  Walking or climbing stairs? 1  Dressing or bathing? 0  Doing errands, shopping? 0  Preparing Food and eating ? N  Using the Toilet? N  In the past six months, have you accidently leaked urine? N  Do you have problems with loss of bowel control? N  Managing your Medications? N  Managing your Finances? N  Housekeeping or managing your Housekeeping? N    Patient Care Team: [name], FNP as PCP - General (Family Medicine) Lucita Ferrara, MD as Referring Physician (Orthopedic Surgery)  Indicate any recent Medical Services you may have received from other than [facility] providers in the past year (date  may be approximate).     Assessment:   This is a routine wellness examination for Emmary.  Hearing/Vision screen Hearing Screening - Comments:: Adequate hearing Vision Screening - Comments:: Adequate vision w/glasses [facility]  Dietary issues and exercise activities discussed: Current Exercise Habits: The patient does not participate in regular exercise at present, Exercise limited by: orthopedic condition(s)   Goals Addressed             This Visit's Progress    DIET - EAT MORE FRUITS AND VEGETABLES   Not on track    Prevent falls   On track    Recommend to remove any items from the home that may cause slips or trips.     Quit smoking / using tobacco   Not on track    Recommend to quit smoking. Pt to cut back from 2 cigarettes a day to none.        Depression Screen    07/10/2022    1:10 PM 06/10/2022    3:08 PM 10/01/2021   10:33 AM 07/05/2021    1:06 PM 02/06/2021    1:56 PM 11/27/2020    1:41 PM 06/01/2020   11:24 AM  PHQ 2/9 Scores  PHQ - 2 Score 0 0 0 0 0 1 3  PHQ- 9 Score 0 3 4  0  7    Fall Risk    07/08/2022    1:07 PM 06/10/2022    3:08 PM 07/05/2021    1:09 PM 11/27/2020    1:41 PM 12/14/2019    2:13 PM  Fall Risk   Falls in the past year? 0 0 0 0 1  Comment    Reports last fall occured in April 2021   Number falls in past yr: 0 0 0 0 0  Injury with Fall? 0 0 0  1  Risk for fall due to :  No Fall Risks No Fall Risks Medication side effect;Other (Comment)   Risk for fall due to: Comment    Frequent edema to right ankle   Follow up  Falls evaluation completed Falls evaluation completed Falls prevention discussed Falls prevention discussed    FALL RISK PREVENTION PERTAINING TO THE HOME:  Any stairs in or around the  home? No  If so, are there any without handrails? No  Home free of loose throw rugs in walkways, pet beds, electrical cords, etc? Yes  Adequate lighting in your home to reduce risk of falls? Yes   ASSISTIVE DEVICES UTILIZED TO PREVENT  FALLS:  Life alert? No  Use of a cane, walker or w/c? No  Grab bars in the bathroom? No  Shower chair or bench in shower? No  Elevated toilet seat or a handicapped toilet? No    Cognitive Function:        07/10/2022    1:15 PM 12/14/2019    2:17 PM  6CIT Screen  What Year? 0 points 0 points  What month? 0 points 0 points  What time? 0 points 0 points  Count back from 20 0 points 0 points  Months in reverse 0 points 0 points  Repeat phrase 0 points 0 points  Total Score 0 points 0 points    Immunizations Immunization History  Administered Date(s) Administered   Tdap 06/23/2019    TDAP status: Up to date  Flu Vaccine status: Declined, Education has been provided regarding the importance of this vaccine but patient still declined. Advised may receive this vaccine at local pharmacy or Health Dept. Aware to provide a copy of the vaccination record if obtained from local pharmacy or Health Dept. Verbalized acceptance and understanding.  Pneumococcal vaccine status: Declined,  Education has been provided regarding the importance of this vaccine but patient still declined. Advised may receive this vaccine at local pharmacy or Health Dept. Aware to provide a copy of the vaccination record if obtained from local pharmacy or Health Dept. Verbalized acceptance and understanding.   Covid-19 vaccine status: Declined, Education has been provided regarding the importance of this vaccine but patient still declined. Advised may receive this vaccine at local pharmacy or Health Dept.or vaccine clinic. Aware to provide a copy of the vaccination record if obtained from local pharmacy or Health Dept. Verbalized acceptance and understanding.  Qualifies for Shingles Vaccine? Yes   Zostavax completed No   Shingrix Completed?: No.    Education has been provided regarding the importance of this vaccine. Patient has been advised to call insurance company to determine out of pocket expense if they have not  yet received this vaccine. Advised may also receive vaccine at local pharmacy or Health Dept. Verbalized acceptance and understanding.  Screening Tests Health Maintenance  Topic Date Due   COVID-19 Vaccine (1) Never done   Zoster Vaccines- Shingrix (1 of 2) Never done   COLONOSCOPY (Pts 45-82yrs Insurance coverage will need to be confirmed)  Never done   MAMMOGRAM  04/26/2018   HIV Screening  03/11/2026 (Originally 05/02/1980)   INFLUENZA VACCINE  10/10/2022   Medicare Annual Wellness (AWV)  07/10/2023   DTaP/Tdap/Td (2 - Td or Tdap) 06/22/2029   HPV VACCINES  Aged Out   PAP SMEAR-Modifier  Discontinued    Health Maintenance  Health Maintenance Due  Topic Date Due   COVID-19 Vaccine (1) Never done   Zoster Vaccines- Shingrix (1 of 2) Never done   COLONOSCOPY (Pts 45-70yrs Insurance coverage will need to be confirmed)  Never done   MAMMOGRAM  04/26/2018    Colorectal cancer screening: Type of screening: Cologuard. Completed no. Repeat every 3 years just got kit to complete  Mammogram status: Completed no. Repeat every year wants to wait to get:has other appts to get done   Lung Cancer Screening: (Low Dose CT Chest recommended if Age  55-80 years, 30 pack-year currently smoking OR have quit w/in 15years.) does not qualify.   Lung Cancer Screening Referral: no  Additional Screening:  Hepatitis C Screening: does not qualify; Completed yes  Vision Screening: Recommended annual ophthalmology exams for early detection of glaucoma and other disorders of the eye. Is the patient up to date with their annual eye exam?  Yes  Who is the provider or what is the name of the office in which the patient attends annual eye exams? [facility]  If pt is not established with a provider, would they like to be referred to a provider to establish care? No .   Dental Screening: Recommended annual dental exams for proper oral hygiene  Community Resource Referral / Chronic Care Management: CRR  required this visit?  No   CCM required this visit?  No     Plan:     I have personally reviewed and noted the following in the patient's chart:   Medical and social history Use of alcohol, tobacco or illicit drugs  Current medications and supplements including opioid prescriptions. Patient is currently taking opioid prescriptions. Information provided to patient regarding non-opioid alternatives. Patient advised to discuss non-opioid treatment plan with their provider. Functional ability and status Nutritional status Physical activity Advanced directives List of other physicians Hospitalizations, surgeries, and ER visits in previous 12 months Vitals Screenings to include cognitive, depression, and falls Referrals and appointments  In addition, I have reviewed and discussed with patient certain preventive protocols, quality metrics, and best practice recommendations. A written personalized care plan for preventive services as well as general preventive health recommendations were provided to patient.     Sue Lush, LPN   03/16/1094   Nurse Notes: The patient states she is doing well and has no concerns or questions at this time.

## 2022-08-01 ENCOUNTER — Other Ambulatory Visit: Payer: Self-pay | Admitting: Family Medicine

## 2022-08-01 DIAGNOSIS — R7303 Prediabetes: Secondary | ICD-10-CM

## 2022-08-01 DIAGNOSIS — Z833 Family history of diabetes mellitus: Secondary | ICD-10-CM

## 2022-08-06 ENCOUNTER — Other Ambulatory Visit: Payer: Self-pay | Admitting: Family Medicine

## 2022-08-06 DIAGNOSIS — M778 Other enthesopathies, not elsewhere classified: Secondary | ICD-10-CM

## 2022-08-06 NOTE — Telephone Encounter (Signed)
Medication Refill - Medication: oxyCODONE-acetaminophen (ENDOCET) 7.5-325 MG tablet [[id number]] amphetamine-dextroamphetamine (ADDERALL) 30 MG tablet [[id number]]   Has the patient contacted their pharmacy? Yes.    (Agent: If yes, when and what did the pharmacy advise?) Contact PCP   Preferred Pharmacy (with phone number or street name): [facility] [address] [place] - [address]   Has the patient been seen for an appointment in the last year OR does the patient have an upcoming appointment? Yes.    Agent: Please be advised that RX refills may take up to 3 business days. We ask that you follow-up with your pharmacy.

## 2022-08-07 ENCOUNTER — Other Ambulatory Visit: Payer: Self-pay | Admitting: Family Medicine

## 2022-08-07 MED ORDER — OXYCODONE-ACETAMINOPHEN 7.5-325 MG PO TABS
1.0000 | ORAL_TABLET | Freq: Two times a day (BID) | ORAL | 0 refills | Status: DC
Start: 2022-08-07 — End: 2022-09-11

## 2022-08-07 NOTE — Telephone Encounter (Signed)
Requested medication (s) are due for refill today -yes  Requested medication (s) are on the active medication list -yes  Future visit scheduled -no  Last refill: 07/08/22 #60  Notes to clinic: non delegated Rx  Requested Prescriptions  Pending Prescriptions Disp Refills   oxyCODONE-acetaminophen (ENDOCET) 7.5-325 MG tablet 60 tablet 0    Sig: Take 1 tablet by mouth 2 (two) times daily.     Not Delegated - Analgesics:  Opioid Agonist Combinations Failed - 08/06/2022  5:39 PM      Failed - This refill cannot be delegated      Failed - Urine Drug Screen completed in last 360 days      Passed - Valid encounter within last 3 months    Recent Outpatient Visits           1 month ago Gastroenteritis   [facility] [place], [place], PA-C   1 month ago Annual physical exam   [facility] Merita Norton T, FNP   8 months ago Chronic idiopathic constipation   [facility] [name], FNP   8 months ago Chronic pansinusitis   [facility] Alfredia Ferguson, PA-C   10 months ago Chronic pansinusitis   [facility] Merita Norton T, [place]                 Requested Prescriptions  Pending Prescriptions Disp Refills   oxyCODONE-acetaminophen (ENDOCET) 7.5-325 MG tablet 60 tablet 0    Sig: Take 1 tablet by mouth 2 (two) times daily.     Not Delegated - Analgesics:  Opioid Agonist Combinations Failed - 08/06/2022  5:39 PM      Failed - This refill cannot be delegated      Failed - Urine Drug Screen completed in last 360 days      Passed - Valid encounter within last 3 months    Recent Outpatient Visits           1 month ago Gastroenteritis   [facility] [place], [place], PA-C   1 month ago Annual physical exam   [facility] Merita Norton T, FNP   8 months ago Chronic idiopathic constipation   [facility] [name], FNP   8 months ago Chronic pansinusitis   [facility] Alfredia Ferguson, PA-C   10 months ago Chronic pansinusitis   [facility] [name], [place]

## 2022-08-08 ENCOUNTER — Other Ambulatory Visit: Payer: Self-pay | Admitting: Family Medicine

## 2022-08-08 DIAGNOSIS — F900 Attention-deficit hyperactivity disorder, predominantly inattentive type: Secondary | ICD-10-CM

## 2022-08-08 NOTE — Telephone Encounter (Signed)
Medication Refill - Medication: amphetamine-dextroamphetamine (ADDERALL) 30 MG tablet   Will be out of medication today.    Has the patient contacted their pharmacy? Yes.   No, more refills.   (Agent: If yes, when and what did the pharmacy advise?)  Preferred Pharmacy (with phone number or street name):  [facility] [address], [place] - [address]  [address] [place] [address]  Phone: [phone] Fax: [phone]  Hours: Not open 24 hours   Has the patient been seen for an appointment in the last year OR does the patient have an upcoming appointment? Yes.    Agent: Please be advised that RX refills may take up to 3 business days. We ask that you follow-up with your pharmacy.

## 2022-08-09 MED ORDER — AMPHETAMINE-DEXTROAMPHETAMINE 30 MG PO TABS: 1.0000 | ORAL_TABLET | Freq: Two times a day (BID) | ORAL | 0 refills | Status: DC

## 2022-08-09 NOTE — Telephone Encounter (Signed)
Requested medications are due for refill today.  yes  Requested medications are on the active medications list.  yes  Last refill. 07/08/2022 #60 0 rf  Future visit scheduled.   no  Notes to clinic.  Refill not delegated.    Requested Prescriptions  Pending Prescriptions Disp Refills   amphetamine-dextroamphetamine (ADDERALL) 30 MG tablet 60 tablet 0    Sig: Take 1 tablet by mouth 2 (two) times daily.     Not Delegated - Psychiatry:  Stimulants/ADHD Failed - 08/08/2022 11:50 AM      Failed - This refill cannot be delegated      Failed - Urine Drug Screen completed in last 360 days      Passed - Last BP in normal range    BP Readings from Last 1 Encounters:  12/07/21 112/63         Passed - Last Heart Rate in normal range    Pulse Readings from Last 1 Encounters:  12/07/21 68         Passed - Valid encounter within last 6 months    Recent Outpatient Visits           1 month ago Gastroenteritis   [facility] [place], [place], PA-C   2 months ago Annual physical exam   [facility] Merita Norton T, FNP   8 months ago Chronic idiopathic constipation   [facility] [name], FNP   8 months ago Chronic pansinusitis   [facility] Alfredia Ferguson, PA-C   10 months ago Chronic pansinusitis   [facility] [name], [place]

## 2022-08-13 DIAGNOSIS — R519 Headache, unspecified: Secondary | ICD-10-CM | POA: Diagnosis not present

## 2022-08-13 DIAGNOSIS — G4730 Sleep apnea, unspecified: Secondary | ICD-10-CM | POA: Diagnosis not present

## 2022-08-13 DIAGNOSIS — J342 Deviated nasal septum: Secondary | ICD-10-CM | POA: Diagnosis not present

## 2022-08-27 ENCOUNTER — Telehealth: Payer: Self-pay | Admitting: Family Medicine

## 2022-08-27 NOTE — Telephone Encounter (Signed)
The patient called in stating she has been waiting for [facility] to carry her amphetamine-dextroamphetamine (ADDERALL) 30 MG tablet but they do not have it and looks like they wont for quite some time. This was called in a few weeks ago and the patient has been out this whole time. She is asking if it can be called into   [id number] - [place], [place] - [address] AT [address] Phone: [phone]  Fax: [phone]     She states [facility] called [facility] yesterday and they told her they do have it in there. Please assist patient as soon as possible

## 2022-08-27 NOTE — Telephone Encounter (Signed)
Please advise 

## 2022-08-28 ENCOUNTER — Other Ambulatory Visit: Payer: Self-pay | Admitting: Family Medicine

## 2022-08-28 DIAGNOSIS — F900 Attention-deficit hyperactivity disorder, predominantly inattentive type: Secondary | ICD-10-CM

## 2022-08-28 MED ORDER — AMPHETAMINE-DEXTROAMPHETAMINE 30 MG PO TABS: 1.0000 | ORAL_TABLET | Freq: Two times a day (BID) | ORAL | 0 refills | Status: DC

## 2022-09-11 ENCOUNTER — Other Ambulatory Visit: Payer: Self-pay | Admitting: Family Medicine

## 2022-09-11 DIAGNOSIS — M778 Other enthesopathies, not elsewhere classified: Secondary | ICD-10-CM

## 2022-09-11 MED ORDER — OXYCODONE-ACETAMINOPHEN 7.5-325 MG PO TABS
1.0000 | ORAL_TABLET | Freq: Two times a day (BID) | ORAL | 0 refills | Status: DC
Start: 2022-09-11 — End: 2022-10-11

## 2022-09-11 NOTE — Telephone Encounter (Signed)
Requested medication (s) are due for refill today - yes  Requested medication (s) are on the active medication list -yes  Future visit scheduled -no  Last refill: 08/07/22 #60  Notes to clinic: non delegated Rx  Requested Prescriptions  Pending Prescriptions Disp Refills   oxyCODONE-acetaminophen (ENDOCET) 7.5-325 MG tablet 60 tablet 0    Sig: Take 1 tablet by mouth 2 (two) times daily.     Not Delegated - Analgesics:  Opioid Agonist Combinations Failed - 09/11/2022 10:13 AM      Failed - This refill cannot be delegated      Failed - Urine Drug Screen completed in last 360 days      Passed - Valid encounter within last 3 months    Recent Outpatient Visits           3 months ago Gastroenteritis   [facility] [place], [place], PA-C   3 months ago Annual physical exam   [facility] Merita Norton T, FNP   9 months ago Chronic idiopathic constipation   [facility] [name], FNP   9 months ago Chronic pansinusitis   [facility] Alfredia Ferguson, PA-C   11 months ago Chronic pansinusitis   [facility] Merita Norton T, [place]                 Requested Prescriptions  Pending Prescriptions Disp Refills   oxyCODONE-acetaminophen (ENDOCET) 7.5-325 MG tablet 60 tablet 0    Sig: Take 1 tablet by mouth 2 (two) times daily.     Not Delegated - Analgesics:  Opioid Agonist Combinations Failed - 09/11/2022 10:13 AM      Failed - This refill cannot be delegated      Failed - Urine Drug Screen completed in last 360 days      Passed - Valid encounter within last 3 months    Recent Outpatient Visits           3 months ago Gastroenteritis   [facility] [place], [employer], PA-C   3 months ago Annual physical exam   [facility] Merita Norton T, FNP   9 months ago Chronic idiopathic constipation   [facility] [name], FNP   9 months ago Chronic pansinusitis   [facility] Alfredia Ferguson, PA-C   11 months ago Chronic pansinusitis   [facility] [name], [place]

## 2022-09-11 NOTE — Telephone Encounter (Signed)
Medication Refill - Medication: oxyCODONE-acetaminophen (ENDOCET) 7.5-325 MG tablet [[id number]]   Has the patient contacted their pharmacy? Yes.     (Agent: If yes, when and what did the pharmacy advise?) Contact PCP   Preferred Pharmacy (with phone number or street name): [facility] [address] [place] - [address]   Has the patient been seen for an appointment in the last year OR does the patient have an upcoming appointment? Yes.    Agent: Please be advised that RX refills may take up to 3 business days. We ask that you follow-up with your pharmacy.   Pt is completely out of medication

## 2022-09-27 ENCOUNTER — Other Ambulatory Visit: Payer: Self-pay | Admitting: Family Medicine

## 2022-09-27 DIAGNOSIS — F900 Attention-deficit hyperactivity disorder, predominantly inattentive type: Secondary | ICD-10-CM

## 2022-09-27 NOTE — Telephone Encounter (Signed)
Requested medications are due for refill today.  yes  Requested medications are on the active medications list.  yes  Last refill. 08/28/2022 #60 0 rf  Future visit scheduled.   no  Notes to clinic.  Refill not delegated.    Requested Prescriptions  Pending Prescriptions Disp Refills   amphetamine-dextroamphetamine (ADDERALL) 30 MG tablet 60 tablet 0    Sig: Take 1 tablet by mouth 2 (two) times daily.     Not Delegated - Psychiatry:  Stimulants/ADHD Failed - 09/27/2022  3:14 PM      Failed - This refill cannot be delegated      Failed - Urine Drug Screen completed in last 360 days      Passed - Last BP in normal range    BP Readings from Last 1 Encounters:  12/07/21 112/63         Passed - Last Heart Rate in normal range    Pulse Readings from Last 1 Encounters:  12/07/21 68         Passed - Valid encounter within last 6 months    Recent Outpatient Visits           3 months ago Gastroenteritis   [facility] [place], [place], PA-C   3 months ago [facility] physical [facility]   [facility], F[facility]   9 months ago Chronic idiopathic constipation   [name], FNP   10 months ago Chronic pansinusitis   [facility] Ferguson, [facility]-C   12 months ago Chronic pansinusitis   [facility] [name], [place]

## 2022-09-27 NOTE — Telephone Encounter (Signed)
Medication Refill - Medication: amphetamine-dextroamphetamine (ADDERALL) 30 MG tablet [[id number]]   Has the patient contacted their pharmacy? Yes.   (Agent: If no, request that the patient contact the pharmacy for the refill. If patient does not wish to contact the pharmacy document the reason why and proceed with request.) (Agent: If yes, when and what did the pharmacy advise?)  Preferred Pharmacy (with phone number or street name): [id number] [place], [place] - [address] AT [address] Phone: [phone]  Fax: [phone]   Has the patient been seen for an appointment in the last year OR does the patient have an upcoming appointment? Yes.    Agent: Please be advised that RX refills may take up to 3 business days. We ask that you follow-up with your pharmacy.

## 2022-09-30 MED ORDER — AMPHETAMINE-DEXTROAMPHETAMINE 30 MG PO TABS: 1.0000 | ORAL_TABLET | Freq: Two times a day (BID) | ORAL | 0 refills | Status: DC

## 2022-10-11 ENCOUNTER — Other Ambulatory Visit: Payer: Self-pay | Admitting: Family Medicine

## 2022-10-11 DIAGNOSIS — M778 Other enthesopathies, not elsewhere classified: Secondary | ICD-10-CM

## 2022-10-11 NOTE — Telephone Encounter (Signed)
Requested medication (s) are due for refill today - yes  Requested medication (s) are on the active medication list -yes  Future visit scheduled -no  Last refill: 09/11/22 #60  Notes to clinic: non delegated Rx  Requested Prescriptions  Pending Prescriptions Disp Refills   oxyCODONE-acetaminophen (ENDOCET) 7.5-325 MG tablet 60 tablet 0    Sig: Take 1 tablet by mouth 2 (two) times daily.     Not Delegated - Analgesics:  Opioid Agonist Combinations Failed - 10/11/2022  9:37 AM      Failed - This refill cannot be delegated      Failed - Urine Drug Screen completed in last 360 days      Failed - Valid encounter within last 3 months    Recent Outpatient Visits           4 months ago Gastroenteritis   [facility] [place], [place], PA-C   4 months ago Annual physical exam   [facility] Mer[facility] Norton T, FNP   10 months ago Chronic idiopathic constipation   [facility] [name], FNP   10 months ago Chronic pansinusitis   [facility]ia Ferguson, PA-C   1 year ago Chronic pansinusitis   [facility] Merita Norton T, [place]                 Requested Prescriptions  Pending Prescriptions Disp Refills   oxyCODONE-acetaminophen (ENDOCET) 7.5-325 MG tablet 60 tablet 0    Sig: Take 1 tablet by mouth 2 (two) times daily.     Not Delegated - Analgesics:  Opioid Agonist Combinations Failed - 10/11/2022  9:37 AM      Failed - This refill cannot be delegated      Failed - Urine Drug Screen completed in last 360 days      Failed - Valid encounter within last 3 months    Recent Outpatient Visits           4 months ago Gastroenteritis   [facility] [place], [facility]-C   4 months ago Annual physical exam   [facility], FNP   10 months ago Chronic idiopathic constipation   [facility] [name], FNP   10 months ago Chronic pansinusitis   [facility], [facility]-C   1 year ago Chronic pansinusitis   [facility] [name], [place]

## 2022-10-11 NOTE — Telephone Encounter (Signed)
Medication Refill - Medication: oxyCODONE-acetaminophen (ENDOCET) 7.5-325 MG tablet  Has the patient contacted their pharmacy? No.  Preferred Pharmacy (with phone number or street name): [facility] [address] [place] - [address]  [address] [place] [address]  Phone:  [phone]  Fax:  [phone]    Has the patient been seen for an appointment in the last year OR does the patient have an upcoming appointment? Yes.    Agent: Please be advised that RX refills may take up to 3 business days. We ask that you follow-up with your pharmacy.

## 2022-10-12 MED ORDER — OXYCODONE-ACETAMINOPHEN 7.5-325 MG PO TABS
1.0000 | ORAL_TABLET | Freq: Two times a day (BID) | ORAL | 0 refills | Status: DC
Start: 2022-10-12 — End: 2022-11-08

## 2022-10-31 ENCOUNTER — Other Ambulatory Visit: Payer: Self-pay | Admitting: Family Medicine

## 2022-10-31 DIAGNOSIS — F900 Attention-deficit hyperactivity disorder, predominantly inattentive type: Secondary | ICD-10-CM

## 2022-10-31 NOTE — Telephone Encounter (Signed)
Medication Refill - Medication: amphetamine-dextroamphetamine (ADDERALL) 30 MG tablet [[id number]]    Has the patient contacted their pharmacy? Yes.   (Agent: If no, request that the patient contact the pharmacy for the refill. If patient does not wish to contact the pharmacy document the reason why and proceed with request.) (Agent: If yes, when and what did the pharmacy advise?)  Preferred Pharmacy (with phone number or street name): [id number] [place], [place] - [address] Phone: [phone]  Fax: [phone]   Has the patient been seen for an appointment in the last year OR does the patient have an upcoming appointment? Yes.    Agent: Please be advised that RX refills may take up to 3 business days. We ask that you follow-up with your pharmacy.

## 2022-11-01 MED ORDER — AMPHETAMINE-DEXTROAMPHETAMINE 30 MG PO TABS: 1.0000 | ORAL_TABLET | Freq: Two times a day (BID) | ORAL | 0 refills | Status: DC

## 2022-11-01 NOTE — Telephone Encounter (Signed)
Requested medication (s) are due for refill today:   Provider to review  Requested medication (s) are on the active medication list:   Yes  Future visit scheduled:   Yes   Last ordered: 09/30/2022 #60, 0 refills  Non delegated refill   Requested Prescriptions  Pending Prescriptions Disp Refills   amphetamine-dextroamphetamine (ADDERALL) 30 MG tablet 60 tablet 0    Sig: Take 1 tablet by mouth 2 (two) times daily.     Not Delegated - Psychiatry:  Stimulants/ADHD Failed - 10/31/2022  4:50 PM      Failed - This refill cannot be delegated      Failed - Urine Drug Screen completed in last 360 days      Passed - Last BP in normal range    BP Readings from Last 1 Encounters:  12/07/21 112/63         Passed - Last Heart Rate in normal range    Pulse Readings from Last 1 Encounters:  12/07/21 68         Passed - Valid encounter within last 6 months    Recent Outpatient Visits           4 months ago Gastroenteritis   [facility] [place], [place], PA-C   4 months ago Annual physical exam   [facility], F[facility]   10 months ago Chronic idiopathic constipation   [facility] [name], FNP   11 months ago Chronic pansinusitis   [facility] Alfredia Ferguson, [facility]-C   1 year ago Chronic pansinusitis   [facility] [name], [place]

## 2022-11-08 ENCOUNTER — Other Ambulatory Visit: Payer: Self-pay | Admitting: Family Medicine

## 2022-11-08 DIAGNOSIS — M778 Other enthesopathies, not elsewhere classified: Secondary | ICD-10-CM

## 2022-11-08 MED ORDER — OXYCODONE-ACETAMINOPHEN 7.5-325 MG PO TABS
1.0000 | ORAL_TABLET | Freq: Two times a day (BID) | ORAL | 0 refills | Status: DC
Start: 2022-11-08 — End: 2022-12-13

## 2022-11-08 NOTE — Telephone Encounter (Signed)
Requested medication (s) are due for refill today - yes  Requested medication (s) are on the active medication list -yes  Future visit scheduled -no  Last refill: 10/12/22 #60  Notes to clinic: non delegated Rx  Requested Prescriptions  Pending Prescriptions Disp Refills   oxyCODONE-acetaminophen (ENDOCET) 7.5-325 MG tablet 60 tablet 0    Sig: Take 1 tablet by mouth 2 (two) times daily.     Not Delegated - Analgesics:  Opioid Agonist Combinations Failed - 11/08/2022  9:38 AM      Failed - This refill cannot be delegated      Failed - Urine Drug Screen completed in last 360 days      Failed - Valid encounter within last 3 months    Recent Outpatient Visits           5 months ago Gastroenteritis   [facility] [place], [place], PA-C   5 months ago Annual physical exam   [facility], FNP   11 months ago Chronic idiopathic constipation   [name], FNP   11 months ago Chronic pansinusitis   [facility], [facility]-C   1 year ago Chronic pansinusitis   [facility] Mer[facility] Norton T, [place]                 Requested Prescriptions  Pending Prescriptions Disp Refills   oxyCODONE-acetaminophen (ENDOCET) 7.5-325 MG tablet 60 tablet 0    Sig: Take 1 tablet by mouth 2 (two) times daily.     Not Delegated - Analgesics:  Opioid Agonist Combinations Failed - 11/08/2022  9:38 AM      Failed - This refill cannot be delegated      Failed - Urine Drug Screen completed in last 360 days      Failed - Valid encounter within last 3 months    Recent Outpatient Visits           5 months ago Gastroenteritis    [facility] [place], [place], [facility]-C   5 months ago Annual physical exam   [facility] T, FNP   11 months ago Chronic idiopathic constipation   [facility] [name], FNP   11 months ago Chronic pansinusitis   [facility] Alfredia Ferguson, PA-C   1 year ago Chronic pansinusitis   [facility] [name], [place]

## 2022-11-08 NOTE — Telephone Encounter (Signed)
Medication Refill - Medication: oxyCODONE-acetaminophen (ENDOCET) 7.5-325 MG tablet   Has the patient contacted their pharmacy? No.  Preferred Pharmacy (with phone number or street name):  [facility] [address] [place] - [address] Phone: [phone]  Fax: [phone]     Has the patient been seen for an appointment in the last year OR does the patient have an upcoming appointment? Yes.    Agent: Please be advised that RX refills may take up to 3 business days. We ask that you follow-up with your pharmacy.

## 2022-12-03 ENCOUNTER — Other Ambulatory Visit: Payer: Self-pay | Admitting: Family Medicine

## 2022-12-03 DIAGNOSIS — F900 Attention-deficit hyperactivity disorder, predominantly inattentive type: Secondary | ICD-10-CM

## 2022-12-03 NOTE — Telephone Encounter (Signed)
Medication Refill - Medication: amphetamine-dextroamphetamine (ADDERALL) 30 MG tablet   Has the patient contacted their pharmacy? No, pt sates she is always told to call in by the pharmacy  ( Preferred Pharmacy (with phone number or street name):  [facility] [address], [place] - [address] Phone: [phone]  Fax: [phone]     Has the patient been seen for an appointment in the last year OR does the patient have an upcoming appointment? yes  Agent: Please be advised that RX refills may take up to 3 business days. We ask that you follow-up with your pharmacy.

## 2022-12-04 MED ORDER — AMPHETAMINE-DEXTROAMPHETAMINE 30 MG PO TABS: 1.0000 | ORAL_TABLET | Freq: Two times a day (BID) | ORAL | 0 refills | Status: DC

## 2022-12-04 NOTE — Telephone Encounter (Signed)
Requested medication (s) are due for refill today: yes  Requested medication (s) are on the active medication list: yes  Last refill:  11/01/22  Future visit scheduled: no  Notes to clinic:  Unable to refill per protocol, cannot delegate.      Requested Prescriptions  Pending Prescriptions Disp Refills   amphetamine-dextroamphetamine (ADDERALL) 30 MG tablet 60 tablet 0    Sig: Take 1 tablet by mouth 2 (two) times daily.     Not Delegated - Psychiatry:  Stimulants/ADHD Failed - 12/03/2022 11:43 AM      Failed - This refill cannot be delegated      Failed - Urine Drug Screen completed in last 360 days      Passed - Last BP in normal range    BP Readings from Last 1 Encounters:  12/07/21 112/63         Passed - Last Heart Rate in normal range    Pulse Readings from Last 1 Encounters:  12/07/21 68         Passed - Valid encounter within last 6 months    Recent Outpatient Visits           5 months ago Gastroenteritis   [facility] [employer], [place], PA-C   5 months ago Annual physical exam   [facility], F[facility]   12 months ago Chronic idiopathic constipation   [name], FNP   1 year ago Chronic pansinusitis   [facility] Alfredia Ferguson, PA-C   1 year ago Chronic pansinusitis   [facility] [name], [place]

## 2022-12-07 DIAGNOSIS — B9689 Other specified bacterial agents as the cause of diseases classified elsewhere: Secondary | ICD-10-CM | POA: Diagnosis not present

## 2022-12-07 DIAGNOSIS — J018 Other acute sinusitis: Secondary | ICD-10-CM | POA: Diagnosis not present

## 2022-12-12 ENCOUNTER — Other Ambulatory Visit: Payer: Self-pay | Admitting: Family Medicine

## 2022-12-12 DIAGNOSIS — M778 Other enthesopathies, not elsewhere classified: Secondary | ICD-10-CM

## 2022-12-12 NOTE — Telephone Encounter (Signed)
Medication Refill - Medication: oxyCODONE-acetaminophen (ENDOCET) 7.5-325 MG tablet [[id number]]   Has the patient contacted their pharmacy? Yes.   (Agent: If no, request that the patient contact the pharmacy for the refill. If patient does not wish to contact the pharmacy document the reason why and proceed with request.) (Agent: If yes, when and what did the pharmacy advise?)  Preferred Pharmacy (with phone number or street name):  [facility] [address] [place] - [address] Phone: [phone]  Fax: [phone]     Has the patient been seen for an appointment in the last year OR does the patient have an upcoming appointment? Yes.    Agent: Please be advised that RX refills may take up to 3 business days. We ask that you follow-up with your pharmacy.

## 2022-12-13 ENCOUNTER — Other Ambulatory Visit: Payer: Self-pay | Admitting: Family Medicine

## 2022-12-13 DIAGNOSIS — Z1211 Encounter for screening for malignant neoplasm of colon: Secondary | ICD-10-CM

## 2022-12-13 DIAGNOSIS — Z1212 Encounter for screening for malignant neoplasm of rectum: Secondary | ICD-10-CM

## 2022-12-13 MED ORDER — OXYCODONE-ACETAMINOPHEN 7.5-325 MG PO TABS
1.0000 | ORAL_TABLET | Freq: Two times a day (BID) | ORAL | 0 refills | Status: DC
Start: 2022-12-13 — End: 2023-01-15

## 2022-12-13 NOTE — Telephone Encounter (Signed)
Requested medication (s) are due for refill today: yes  Requested medication (s) are on the active medication list: yes  Last refill:  11/08/22 #60  Future visit scheduled: no  Notes to clinic:  med not delegated to NT to RF   Requested Prescriptions  Pending Prescriptions Disp Refills   oxyCODONE-acetaminophen (ENDOCET) 7.5-325 MG tablet 60 tablet 0    Sig: Take 1 tablet by mouth 2 (two) times daily.     Not Delegated - Analgesics:  Opioid Agonist Combinations Failed - 12/13/2022  8:38 AM      Failed - This refill cannot be delegated      Failed - Urine Drug Screen completed in last 360 days      Failed - Valid encounter within last 3 months    Recent Outpatient Visits           6 months ago Gastroenteritis   [place] [facility] [place], [place], PA-C   6 months ago Annual physical exam   [facility] Mer[facility] Norton T, FNP   1 year ago Chronic idiopathic constipation   [facility] [name], FNP   1 year ago Chronic pansinusitis   [facility] Alfredia Ferguson, PA-C   1 year ago Chronic pansinusitis   [facility] [name], [place]

## 2022-12-17 DIAGNOSIS — M170 Bilateral primary osteoarthritis of knee: Secondary | ICD-10-CM | POA: Diagnosis not present

## 2022-12-17 DIAGNOSIS — M25561 Pain in right knee: Secondary | ICD-10-CM | POA: Diagnosis not present

## 2022-12-17 DIAGNOSIS — G8929 Other chronic pain: Secondary | ICD-10-CM | POA: Diagnosis not present

## 2022-12-17 DIAGNOSIS — M25562 Pain in left knee: Secondary | ICD-10-CM | POA: Diagnosis not present

## 2023-01-03 ENCOUNTER — Other Ambulatory Visit: Payer: Self-pay | Admitting: Family Medicine

## 2023-01-03 DIAGNOSIS — J4520 Mild intermittent asthma, uncomplicated: Secondary | ICD-10-CM

## 2023-01-03 DIAGNOSIS — F900 Attention-deficit hyperactivity disorder, predominantly inattentive type: Secondary | ICD-10-CM

## 2023-01-03 NOTE — Telephone Encounter (Signed)
Medication Refill - Medication:   Adderall 30 mg Albutorol Inhaler  Has the patient contacted their pharmacy? Yes.   (Agent: If no, request that the patient contact the pharmacy for the refill. If patient does not wish to contact the pharmacy document the reason why and proceed with request.) (Agent: If yes, when and what did the pharmacy advise?)  Preferred Pharm[facility] (with phone number [facility]  Has the patient been seen for an appointment in the last year OR does the patient have an upcoming appointment? Yes.    Agent: Please be advised that RX refills may take up to 3 business days. We ask that you follow-up with your pharmacy.

## 2023-01-06 ENCOUNTER — Other Ambulatory Visit: Payer: Self-pay | Admitting: Family Medicine

## 2023-01-06 DIAGNOSIS — F900 Attention-deficit hyperactivity disorder, predominantly inattentive type: Secondary | ICD-10-CM

## 2023-01-06 MED ORDER — AMPHETAMINE-DEXTROAMPHETAMINE 30 MG PO TABS: 1.0000 | ORAL_TABLET | Freq: Two times a day (BID) | ORAL | 0 refills | Status: DC

## 2023-01-06 MED ORDER — ALBUTEROL SULFATE HFA 108 (90 BASE) MCG/ACT IN AERS: INHALATION_SPRAY | RESPIRATORY_TRACT | 0 refills | Status: DC

## 2023-01-06 NOTE — Telephone Encounter (Signed)
Requested Prescriptions  Pending Prescriptions Disp Refills   amphetamine-dextroamphetamine (ADDERALL) 30 MG tablet 60 tablet 0    Sig: Take 1 tablet by mouth 2 (two) times daily.     Not Delegated - Psychiatry:  Stimulants/ADHD Failed - 01/03/2023 10:24 AM      Failed - This refill cannot be delegated      Failed - Urine Drug Screen completed in last 360 days      Passed - Last BP in normal range    BP Readings from Last 1 Encounters:  12/07/21 112/63         Passed - Last Heart Rate in normal range    Pulse Readings from Last 1 Encounters:  12/07/21 68         Passed - Valid encounter within last 6 months    Recent Outpatient Visits           6 months ago Gastroenteritis   [facility] [place], [place], PA-C   7 months ago Annual physical exam   [facility], FNP   1 year ago Chronic idiopathic constipation   [facility] [name], FNP   1 year ago Chronic pansinusitis   [facility] Alfredia Ferguson, PA-C   1 year ago Chronic pansinusitis   [facility] Norton [facility], FNP               albuterol (VENTOLIN HFA) 108 (90 Base) MCG/ACT inhaler 18 g 0    Sig: INHALE 2 PUFFS INTO THE LUNGS EVERY 4 HOURS AS NEEDED FOR WHEEZE OR FOR SHORTNESS OF BREATH     Pulmonology:  Beta Agonists 2 Passed - 01/03/2023 10:24 AM      Passed - Last BP in normal range    BP Readings from Last 1 Encounters:  12/07/21 112/63         Passed - Last Heart Rate in normal range    Pulse Readings from Last 1 Encounters:  12/07/21 68         Passed - Valid encounter within last 12 months    Recent Outpatient Visits           6 months ago Gastroenteritis   [place] [facility] [place], [place], PA-C   7 months ago Annual physical exam   [facility] Mer[facility] Norton [facility], FNP   1 year ago Chronic  idiopathic constipation   [facility] [name], FNP   1 year ago Chronic pansinusitis   [facility] Alfredia Ferguson, PA-C   1 year ago Chronic pansinusitis   [facility] [name], [place]

## 2023-01-06 NOTE — Telephone Encounter (Signed)
Medication Refill - Medication: amphetamine-dextroamphetamine (ADDERALL) 30 MG tablet [[id number]]   Has the patient contacted their pharmacy? Yes.   (Agent: If no, request that the patient contact the pharmacy for the refill. If patient does not wish to contact the pharmacy document the reason why and proceed with request.) (Agent: If yes, when and what did the pharmacy advise?)  Preferred Pharmacy (with phone number or street name):  [id number] [place], [place] - [address] Phone: [phone]  Fax: [phone]     Has the patient been seen for an appointment in the last year OR does the patient have an upcoming appointment? Yes.    Agent: Please be advised that RX refills may take up to 3 business days. We ask that you follow-up with your pharmacy.

## 2023-01-06 NOTE — Telephone Encounter (Signed)
Requested medication (s) are due for refill today: yes  Requested medication (s) are on the active medication list: yes  Last refill:  12/04/22  Future visit scheduled: no  Notes to clinic:  Unable to refill per protocol, cannot delegate.      Requested Prescriptions  Pending Prescriptions Disp Refills   amphetamine-dextroamphetamine (ADDERALL) 30 MG tablet 60 tablet 0    Sig: Take 1 tablet by mouth 2 (two) times daily.     Not Delegated - Psychiatry:  Stimulants/ADHD Failed - 01/03/2023 10:24 AM      Failed - This refill cannot be delegated      Failed - Urine Drug Screen completed in last 360 days      Passed - Last BP in normal range    BP Readings from Last 1 Encounters:  12/07/21 112/63         Passed - Last Heart Rate in normal range    Pulse Readings from Last 1 Encounters:  12/07/21 68         Passed - Valid encounter within last 6 months    Recent Outpatient Visits           6 months ago Gastroenteritis   [facility] [place], [place], PA-C   7 months ago Annual physical exam   [facility] Mer[facility] Norton T, FNP   1 year ago Chronic idiopathic constipation   [facility] [name], FNP   1 year ago Chronic pansinusitis   [facility] Alfredia Ferguson, PA-C   1 year ago Chronic pansinusitis   [facility] Merita Norton T, [place]              Signed Prescriptions Disp Refills   albuterol (VENTOLIN HFA) 108 (90 Base) MCG/ACT inhaler 18 g 0    Sig: INHALE 2 PUFFS INTO THE LUNGS EVERY 4 HOURS AS NEEDED FOR WHEEZE OR FOR SHORTNESS OF BREATH     Pulmonology:  Beta Agonists 2 Passed - 01/03/2023 10:24 AM      Passed - Last BP in normal range    BP Readings from Last 1 Encounters:  12/07/21 112/63         Passed - Last Heart Rate in normal range    Pulse Readings from Last 1 Encounters:  12/07/21 68         Passed - Valid  encounter within last 12 months    Recent Outpatient Visits           6 months ago Gastroenteritis   [place] [place], [place], PA-C   7 months ago Annual physical exam   [facility] Mer[facility] Norton T, FNP   1 year ago Chronic idiopathic constipation   [facility] [name], FNP   1 year ago Chronic pansinusitis   [facility] Alfredia Ferguson, PA-C   1 year ago Chronic pansinusitis   [facility] [name], [place]

## 2023-01-07 NOTE — Telephone Encounter (Signed)
Requested medication (s) are due for refill today: yes  Requested medication (s) are on the active medication list: yes  Last refill:  01/06/23  Future visit scheduled: no  Notes to clinic:  refilled 01/07/23, unable to refuse     Requested Prescriptions  Pending Prescriptions Disp Refills   amphetamine-dextroamphetamine (ADDERALL) 30 MG tablet 60 tablet 0    Sig: Take 1 tablet by mouth 2 (two) times daily.     Not Delegated - Psychiatry:  Stimulants/ADHD Failed - 01/06/2023 10:34 AM      Failed - This refill cannot be delegated      Failed - Urine Drug Screen completed in last 360 days      Passed - Last BP in normal range    BP Readings from Last 1 Encounters:  12/07/21 112/63         Passed - Last Heart Rate in normal range    Pulse Readings from Last 1 Encounters:  12/07/21 68         Passed - Valid encounter within last 6 months    Recent Outpatient Visits           7 months ago Gastroenteritis    [facility] [place], [place], PA-C   7 months ago Annual physical exam   [name], FNP   1 year ago Chronic idiopathic constipation   [facility] [name], FNP   1 year ago Chronic pansinusitis   [facility] Alfredia Ferguson, PA-C   1 year ago Chronic pansinusitis   [facility] [facility] [name], [place]

## 2023-01-10 ENCOUNTER — Other Ambulatory Visit: Payer: Self-pay | Admitting: Family Medicine

## 2023-01-10 DIAGNOSIS — M778 Other enthesopathies, not elsewhere classified: Secondary | ICD-10-CM

## 2023-01-10 NOTE — Telephone Encounter (Unsigned)
Copied from CRM [id number]. Topic: General - Other >> Jan 10, 2023  2:35 PM Everette C wrote: Reason for CRM: Medication Refill - Medication: oxyCODONE-acetaminophen (ENDOCET) 7.5-325 MG tablet [[id number]]  Has the patient contacted their pharmacy? Yes.   (Agent: If no, request that the patient contact the pharmacy for the refill. If patient does not wish to contact the pharmacy document the reason why and proceed with request.) (Agent: If yes, when and what did the pharmacy advise?)  Preferred Pharmacy (with phone number or street name): [facility] [address], [place] - [address] [place] [address] Phone: [phone] Fax: [phone] Hours: Not open 24 hours   Has the patient been seen for an appointment in the last year OR does the patient have an upcoming appointment? Yes.    Agent: Please be advised that RX refills may take up to 3 business days. We ask that you follow-up with your pharmacy.

## 2023-01-10 NOTE — Telephone Encounter (Signed)
Requested medication (s) are due for refill today: Yes  Requested medication (s) are on the active medication list: Yes  Last refill:  12/13/22 #60  Future visit scheduled: No  Notes to clinic:  Unable to refill per protocol, cannot delegate.      Requested Prescriptions  Pending Prescriptions Disp Refills   oxyCODONE-acetaminophen (ENDOCET) 7.5-325 MG tablet 60 tablet 0    Sig: Take 1 tablet by mouth 2 (two) times daily.     Not Delegated - Analgesics:  Opioid Agonist Combinations Failed - 01/10/2023  2:39 PM      Failed - This refill cannot be delegated      Failed - Urine Drug Screen completed in last 360 days      Failed - Valid encounter within last 3 months    Recent Outpatient Visits           7 months ago Gastroenteritis   [facility] [place], [place], PA-C   7 months ago Annual physical exam   [facility] Merita Norton T, FNP   1 year ago Chronic idiopathic constipation   [facility] [name], FNP   1 year ago Chronic pansinusitis   [facility] Alfredia Ferguson, PA-C   1 year ago Chronic pansinusitis   [facility] [name], [place]

## 2023-01-15 ENCOUNTER — Other Ambulatory Visit: Payer: Self-pay | Admitting: Family Medicine

## 2023-01-15 DIAGNOSIS — M778 Other enthesopathies, not elsewhere classified: Secondary | ICD-10-CM

## 2023-01-15 NOTE — Telephone Encounter (Signed)
Medication Refill - oxyCODONE-acetaminophen (ENDOCET) 7.5-325 MG tablet  Most Recent Primary Care Visit:  Provider: Sue Lush  [facility]  Visit Type: MEDICARE AWV, SEQUENTIAL  Date: 07/10/2022  Medication:  [facility] [address] [place] - [address] Phone: [phone]  Fax: [phone]     oxyCODONE-Acetaminophen 1  Has the patient contacted their pharmacy? yes  (Agent: If yes, when and what did the pharmacy advise?)contact pcp  Is this the correct pharmacy for this prescription? yes  This is the patient's preferred pharmacy:  [facility] [address] [place] - [address] Phone: [phone]  Fax: [phone]     Has the prescription been filled recently? no  Is the patient out of the medication? yes  Has the patient been seen for an appointment in the last year OR does the patient have an upcoming appointment? yes  Can we respond through MyChart? yes  Agent: Please be advised that Rx refills may take up to 3 business days. We ask that you follow-up with your pharmacy.

## 2023-01-16 NOTE — Telephone Encounter (Signed)
Requested medications are due for refill today.  yes  Requested medications are on the active medications list.  yes  Last refill. 12/13/2022 #60 0 rf  Future visit scheduled.   yes  Notes to clinic.  Refill not delegated.    Requested Prescriptions  Pending Prescriptions Disp Refills   oxyCODONE-acetaminophen (ENDOCET) 7.5-325 MG tablet 60 tablet 0    Sig: Take 1 tablet by mouth 2 (two) times daily.     Not Delegated - Analgesics:  Opioid Agonist Combinations Failed - 01/15/2023 12:34 PM      Failed - This refill cannot be delegated      Failed - Urine Drug Screen completed in last 360 days      Failed - Valid encounter within last 3 months    Recent Outpatient Visits           7 months ago Gastroenteritis   [facility] [place], [place], PA-C   7 months ago Annual physical exam   [facility] Merita Norton T, FNP   1 year ago Chronic idiopathic constipation   [facility] [name], FNP   1 year ago Chronic pansinusitis   [facility] Alfredia Ferguson, PA-C   1 year ago Chronic pansinusitis   [facility] [name], FNP       Future Appointments             In 5 days [name], FNP [facility]

## 2023-01-17 MED ORDER — OXYCODONE-ACETAMINOPHEN 7.5-325 MG PO TABS: 1.0000 | ORAL_TABLET | Freq: Two times a day (BID) | ORAL | 0 refills | Status: DC

## 2023-01-19 ENCOUNTER — Other Ambulatory Visit: Payer: Self-pay | Admitting: Family Medicine

## 2023-01-19 DIAGNOSIS — R609 Edema, unspecified: Secondary | ICD-10-CM

## 2023-01-21 ENCOUNTER — Encounter: Payer: Self-pay | Admitting: Family Medicine

## 2023-01-21 ENCOUNTER — Telehealth (INDEPENDENT_AMBULATORY_CARE_PROVIDER_SITE_OTHER): Payer: 59 | Admitting: Family Medicine

## 2023-01-21 VITALS — Ht 61.0 in | Wt 202.0 lb

## 2023-01-21 DIAGNOSIS — Z1211 Encounter for screening for malignant neoplasm of colon: Secondary | ICD-10-CM

## 2023-01-21 DIAGNOSIS — F900 Attention-deficit hyperactivity disorder, predominantly inattentive type: Secondary | ICD-10-CM | POA: Diagnosis not present

## 2023-01-21 DIAGNOSIS — G8929 Other chronic pain: Secondary | ICD-10-CM | POA: Insufficient documentation

## 2023-01-21 DIAGNOSIS — J441 Chronic obstructive pulmonary disease with (acute) exacerbation: Secondary | ICD-10-CM | POA: Diagnosis not present

## 2023-01-21 DIAGNOSIS — F17210 Nicotine dependence, cigarettes, uncomplicated: Secondary | ICD-10-CM

## 2023-01-21 DIAGNOSIS — F17200 Nicotine dependence, unspecified, uncomplicated: Secondary | ICD-10-CM

## 2023-01-21 DIAGNOSIS — M542 Cervicalgia: Secondary | ICD-10-CM | POA: Diagnosis not present

## 2023-01-21 MED ORDER — ALBUTEROL SULFATE HFA 108 (90 BASE) MCG/ACT IN AERS: INHALATION_SPRAY | RESPIRATORY_TRACT | 0 refills | Status: DC

## 2023-01-21 MED ORDER — OXYCODONE-ACETAMINOPHEN 7.5-325 MG PO TABS: 1.0000 | ORAL_TABLET | Freq: Two times a day (BID) | ORAL | 0 refills | Status: DC

## 2023-01-21 MED ORDER — AMPHETAMINE-DEXTROAMPHETAMINE 30 MG PO TABS: 1.0000 | ORAL_TABLET | Freq: Two times a day (BID) | ORAL | 0 refills | Status: DC

## 2023-01-21 MED ORDER — AMOXICILLIN-POT CLAVULANATE 875-125 MG PO TABS: 1.0000 | ORAL_TABLET | Freq: Two times a day (BID) | ORAL | 0 refills | Status: DC

## 2023-01-21 MED ORDER — METHYLPREDNISOLONE 4 MG PO TBPK: ORAL_TABLET | ORAL | 0 refills | Status: DC

## 2023-01-21 NOTE — Assessment & Plan Note (Signed)
Acute; worsening complaints x 4 weeks Failed OTC treatment Recommend use of abx and steroids to assist Continue supportive measures If not improved; recommend in office appt

## 2023-01-21 NOTE — Assessment & Plan Note (Signed)
Chronic, unchanged Recommend LDCT

## 2023-01-21 NOTE — Assessment & Plan Note (Signed)
Chronic, stable Continue medication at BID dosing

## 2023-01-21 NOTE — Assessment & Plan Note (Signed)
Chronic with flare d/t sinus drainage and sinus pressure Continue prn opioids to assist

## 2023-01-21 NOTE — Progress Notes (Signed)
MyChart Video Visit    Virtual Visit via Video Note   This format is felt to be most appropriate for this patient at this time. Physical exam was limited by quality of the video and audio technology used for the visit.   Patient location: home Provider location: [facility] [address]  [address] [place], [place] [address]  I discussed the limitations of evaluation and management by telemedicine and the availability of in person appointments. The patient expressed understanding and agreed to proceed.  Patient: Kathleen Buchanan   DOB: November 01, 1965   57 y.o. Female  MRN: [id number] Visit Date: 01/21/2023  Today's healthcare provider: [name], FNP   Chief Complaint  Patient presents with   Follow-up    Medication refill   Subjective    HPI HPI     Follow-up    Additional comments: Medication refill      Last edited by Shelly Bombard, CMA on 01/21/2023  3:50 PM.      Medications: Outpatient Medications Prior to Visit  Medication Sig   Aspirin-Caffeine (BC FAST PAIN RELIEF PO) Take by mouth as needed.   Blood Glucose Monitoring Suppl (ONETOUCH VERIO) w/Device KIT To check blood sugar daily for diabetes   busPIRone (BUSPAR) 5 MG tablet Take 1 tablet (5 mg total) by mouth 3 (three) times daily as needed.   cetirizine (ZYRTEC) 10 MG tablet Take 1 tablet (10 mg total) by mouth daily.   ezetimibe (ZETIA) 10 MG tablet Take 1 tablet (10 mg total) by mouth daily.   hydrochlorothiazide (HYDRODIURIL) 12.5 MG tablet TAKE 1 TABLET BY MOUTH EVERY DAY   ibuprofen (ADVIL,MOTRIN) 200 MG tablet Take 4 tablets by mouth as needed.   lactulose (CHRONULAC) 10 GM/15ML solution Take 15 mLs (10 g total) by mouth 2 (two) times daily as needed for severe constipation.   Lancets (ONETOUCH ULTRASOFT) lancets To check blood sugar daily for diabetes   levothyroxine (SYNTHROID) 100 MCG tablet Take 1 tablet by mouth once daily   ONETOUCH VERIO test strip TO CHECK BLOOD SUGAR  DAILY FOR DIABETES   pantoprazole (PROTONIX) 40 MG tablet Take 1 tablet (40 mg total) by mouth daily.   Vitamin D, Ergocalciferol, (DRISDOL) 1.25 MG (50000 UNIT) CAPS capsule Take 1 capsule (50,000 Units total) by mouth every 7 (seven) days.   [DISCONTINUED] albuterol (VENTOLIN HFA) 108 (90 Base) MCG/ACT inhaler INHALE 2 PUFFS INTO THE LUNGS EVERY 4 HOURS AS NEEDED FOR WHEEZE OR FOR SHORTNESS OF BREATH   [DISCONTINUED] amphetamine-dextroamphetamine (ADDERALL) 30 MG tablet Take 1 tablet by mouth 2 (two) times daily.   [DISCONTINUED] azelastine (ASTELIN) 0.1 % nasal spray Place 1 spray into both nostrils 2 (two) times daily. Use in each nostril as directed   [DISCONTINUED] oxyCODONE-acetaminophen (ENDOCET) 7.5-325 MG tablet Take 1 tablet by mouth 2 (two) times daily.   diazepam (VALIUM) 2 MG tablet TAKE 1 TABLET BY MOUTH EVERY 12 HOURS AS NEEDED FOR ANXIETY (Patient not taking: Reported on 01/21/2023)   EPINEPHrine 0.3 mg/0.3 mL IJ SOAJ injection Inject 0.3 mg into the muscle as needed. (Patient not taking: Reported on 01/21/2023)   [DISCONTINUED] sodium chloride (OCEAN) 0.65 % SOLN nasal spray Place 1 spray into both nostrils as needed for congestion. (Patient not taking: Reported on 01/21/2023)   No facility-administered medications prior to visit.    Objective    Ht 5\' 1"  (1.549 m)   Wt 202 lb (91.6 kg) Comment: home weight  BMI 38.17 kg/m  Physical Exam Constitutional:      Appearance: Normal appearance.  HENT:     Head:     Comments: Complaints of L ear fullness, L sinus pain extending across to R, and acute on chronic pain along L neck; failed OTC cough/cold medicine Pulmonary:     Effort: Pulmonary effort is normal.     Comments: Able to speak in full sentences Neurological:     Mental Status: She is alert and oriented to person, place, and time.  Psychiatric:        Mood and Affect: Mood normal.        Behavior: Behavior normal.        Thought Content: Thought content normal.         Judgment: Judgment normal.      Assessment & Plan     Problem List Items Addressed This Visit       Respiratory   COPD, frequent exacerbations (HCC) - Primary    Acute; worsening complaints x 4 weeks Failed OTC treatment Recommend use of abx and steroids to assist Continue supportive measures If not improved; recommend in office appt      Relevant Medications   methylPREDNISolone (MEDROL DOSEPAK) 4 MG TBPK tablet   amoxicillin-clavulanate (AUGMENTIN) 875-125 MG tablet   albuterol (VENTOLIN HFA) 108 (90 Base) MCG/ACT inhaler   Other Relevant Orders   Ambulatory Referral Lung Cancer Screening [facility]     Other   ADD (attention deficit disorder)    Chronic, stable Continue medication at BID dosing       Relevant Medications   amphetamine-dextroamphetamine (ADDERALL) 30 MG tablet   Colon cancer screening    Recommend cologuard completion for colon cancer screening      Relevant Orders   Cologuard   Neck pain, chronic    Chronic with flare d/t sinus drainage and sinus pressure Continue prn opioids to assist      Relevant Medications   oxyCODONE-acetaminophen (ENDOCET) 7.5-325 MG tablet   methylPREDNISolone (MEDROL DOSEPAK) 4 MG TBPK tablet   Tobacco dependence    Chronic, unchanged Recommend LDCT      Relevant Orders   Ambulatory Referral Lung Cancer Screening [facility]   Return in about 6 months (around 07/21/2023) for annual examination.    I discussed the assessment and treatment plan with the patient. The patient was provided an opportunity to ask questions and all were answered. The patient agreed with the plan and demonstrated an understanding of the instructions.   The patient was advised to call back or seek an in-person evaluation if the symptoms worsen or if the condition fails to improve as anticipated.  I provided 10 minutes of face-to-face time during this encounter discussing acute sinus complaints and chronic pain,  AD(H)D.  Leilani Merl, FNP, have reviewed all documentation for this visit. The documentation on 01/21/23 for the exam, diagnosis, procedures, and orders are all accurate and complete.   [name], FNP [facility] [phone] (phone) [phone] (fax)  [facility]

## 2023-01-21 NOTE — Assessment & Plan Note (Signed)
Recommend cologuard completion for colon cancer screening

## 2023-02-13 ENCOUNTER — Other Ambulatory Visit: Payer: Self-pay | Admitting: Family Medicine

## 2023-02-13 DIAGNOSIS — G8929 Other chronic pain: Secondary | ICD-10-CM

## 2023-02-13 NOTE — Telephone Encounter (Signed)
Medication Refill -  Most Recent Primary Care Visit:  Provider: Merita Norton T  Department: [facility]  Visit Type: [facility] VIDEO VISIT  Date: 01/21/2023  Medication:  [facility] [address] [place] - [address] Phone: [phone]  Fax: [phone]      Has the patient contacted their pharmacy? No   Is this the correct pharmacy for this prescription? No If no, delete pharmacy and type the correct one.  This is the patient's preferred pharmacy: oxyCODONE-acetaminophen (ENDOCET) 7.5-325 MG tablet    Has the prescription been filled recently? Yes  Is the patient out of the medication? Yes  Has the patient been seen for an appointment in the last year OR does the patient have an upcoming appointment? Yes  Can we respond through MyChart? Yes  The patient has a few left. Please assist patient further

## 2023-02-14 NOTE — Telephone Encounter (Signed)
Requested medication (s) are due for refill today - yes  Requested medication (s) are on the active medication list -yes  Future visit scheduled -no  Last refill: 01/21/23 #60  Notes to clinic: non delegated Rx  Requested Prescriptions  Pending Prescriptions Disp Refills   oxyCODONE-acetaminophen (ENDOCET) 7.5-325 MG tablet 60 tablet 0    Sig: Take 1 tablet by mouth 2 (two) times daily.     Not Delegated - Analgesics:  Opioid Agonist Combinations Failed - 02/13/2023  4:06 PM      Failed - This refill cannot be delegated      Failed - Urine Drug Screen completed in last 360 days      Passed - Valid encounter within last 3 months    Recent Outpatient Visits           3 weeks ago COPD, frequent exacerbations (HCC)   [facility] [name], FNP   8 months ago Gastroenteritis   [facility] [place], PA-C   8 months ago Annual physical exam   [facility] T, FNP   1 year ago Chronic idiopathic constipation   [facility] Merita Norton T, FNP   1 year ago Chronic pansinusitis   [facility] Alfredia Ferguson, PA-C                 Requested Prescriptions  Pending Prescriptions Disp Refills   oxyCODONE-acetaminophen (ENDOCET) 7.5-325 MG tablet 60 tablet 0    Sig: Take 1 tablet by mouth 2 (two) times daily.     Not Delegated - Analgesics:  Opioid Agonist Combinations Failed - 02/13/2023  4:06 PM      Failed - This refill cannot be delegated      Failed - Urine Drug Screen completed in last 360 days      Passed - Valid encounter within last 3 months    Recent Outpatient Visits           3 weeks ago COPD, frequent exacerbations [facility])   [place] [facility] [name], FNP   8 months ago Gastroenteritis    [facility] [place] [place], PA-C   8 months ago Annual physical  exam   [facility] Mer[facility] Norton T, FNP   1 year ago Chronic idiopathic constipation   [facility] [name], FNP   1 year ago Chronic pansinusitis   [facility] Alfredia Ferguson, [place]

## 2023-02-18 MED ORDER — OXYCODONE-ACETAMINOPHEN 7.5-325 MG PO TABS: 1.0000 | ORAL_TABLET | Freq: Two times a day (BID) | ORAL | 0 refills | Status: DC

## 2023-03-21 ENCOUNTER — Other Ambulatory Visit: Payer: Self-pay | Admitting: Family Medicine

## 2023-03-21 ENCOUNTER — Telehealth: Payer: Self-pay

## 2023-03-21 DIAGNOSIS — F900 Attention-deficit hyperactivity disorder, predominantly inattentive type: Secondary | ICD-10-CM

## 2023-03-21 DIAGNOSIS — G8929 Other chronic pain: Secondary | ICD-10-CM

## 2023-03-21 NOTE — Telephone Encounter (Signed)
 Medication Refill -  Most Recent Primary Care Visit:  Provider: PAYNE, ELISE T  Department[facility]  Visit Type: MYCHART VIDEO VISIT  Date: 01/21/2023  Medication: oxyCODONE -acetaminophen  (ENDOCET) 7.5-325 MG tablet /amphetamine -dextroamphetamine  (ADDERALL) 30 MG tablet  Has the patient contacted their pharmacy? No, because of type of meds this are   Is this the correct pharmacy for this prescription? yes  This is the patient's preferred pharmacy:  [facility] [address], [place] - [address] [place] [address] Phone: [phone] Fax: [phone]  Is the patient out of the medication? Yes but oxychodone wont be out until Sunday  Has the patient been seen for an appointment in the last year OR does the patient have an upcoming appointment? yes  Can we respond through MyChart? yes  Agent: Please be advised that Rx refills may take up to 3 business days. We ask that you follow-up with your pharmacy.

## 2023-03-21 NOTE — Telephone Encounter (Signed)
Copied from CRM [id number]. Topic: Appointment Scheduling - Scheduling Inquiry for Clinic >> Mar 21, 2023 10:47 AM Turkey B wrote: Reason for CRM: Pleas cb to reschedle pt's AWV from 07/2023. Next schedule I have goes ino 2026

## 2023-03-24 NOTE — Telephone Encounter (Signed)
 Requested medication (s) are due for refill today: yes  Requested medication (s) are on the active medication list: yes  Last refill:  02/18/23  Future visit scheduled: no  Notes to clinic:  Unable to refill per protocol, cannot delegate.      Requested Prescriptions  Pending Prescriptions Disp Refills   oxyCODONE -acetaminophen  (ENDOCET) 7.5-325 MG tablet 60 tablet 0    Sig: Take 1 tablet by mouth 2 (two) times daily.     Not Delegated - Analgesics:  Opioid Agonist Combinations Failed - 03/24/2023  2:35 PM      Failed - This refill cannot be delegated      Failed - Urine Drug Screen completed in last 360 days      Passed - Valid encounter within last 3 months    Recent Outpatient Visits           2 months ago COPD, frequent exacerbations [facility])   [facility] Emilio Kelly DASEN, FNP   9 months ago Gastroenteritis   [place] [facility] [place], [place], PA-C   9 months ago Annual physical exam   [facility] Emilio Kelly T, FNP   1 year ago Chronic idiopathic constipation   [facility] Emilio Kelly T, FNP   1 year ago Chronic pansinusitis   [facility] Cyndi Shaver, PA-C               amphetamine -dextroamphetamine  (ADDERALL) 30 MG tablet 60 tablet 0    Sig: Take 1 tablet by mouth 2 (two) times daily.     Not Delegated - Psychiatry:  Stimulants/ADHD Failed - 03/24/2023  2:35 PM      Failed - This refill cannot be delegated      Failed - Urine Drug Screen completed in last 360 days      Passed - Last BP in normal range    BP Readings from Last 1 Encounters:  12/07/21 112/63         Passed - Last Heart Rate in normal range    Pulse Readings from Last 1 Encounters:  12/07/21 68         Passed - Valid encounter within last 6 months    Recent Outpatient Visits           2 months ago COPD, frequent exacerbations (HCC)   [facility] Emilio Kelly DASEN, FNP   9 months ago Gastroenteritis    [facility] [place], [place], PA-C   9 months ago Annual physical exam   [facility] Em[facility] Kelly DASEN, FNP   1 year ago Chronic idiopathic constipation   [facility] Emilio Kelly DASEN, FNP   1 year ago Chronic pansinusitis   [facility] Cyndi Shaver, PA-C

## 2023-03-25 MED ORDER — AMPHETAMINE-DEXTROAMPHETAMINE 30 MG PO TABS: 1.0000 | ORAL_TABLET | Freq: Two times a day (BID) | ORAL | 0 refills | Status: DC

## 2023-03-25 MED ORDER — OXYCODONE-ACETAMINOPHEN 7.5-325 MG PO TABS: 1.0000 | ORAL_TABLET | Freq: Two times a day (BID) | ORAL | 0 refills | Status: DC

## 2023-03-27 ENCOUNTER — Other Ambulatory Visit: Payer: Self-pay | Admitting: Family Medicine

## 2023-03-27 DIAGNOSIS — K219 Gastro-esophageal reflux disease without esophagitis: Secondary | ICD-10-CM

## 2023-03-27 NOTE — Telephone Encounter (Signed)
Original Rx sent to [facility]- this request is from [facility]- attempted to call patient to verify pharmacy- left message to call office

## 2023-03-27 NOTE — Telephone Encounter (Signed)
I spoke with patient. AWV rescheduled to 07/22/2023 at 3:50.

## 2023-03-27 NOTE — Telephone Encounter (Signed)
Requested by interface surescripts. No future visit at this time.  Requested Prescriptions  Pending Prescriptions Disp Refills   pantoprazole (PROTONIX) 40 MG tablet [Pharmacy Med Name: PANTOPRAZOLE SOD DR 40 MG TAB] 90 tablet 0    Sig: TAKE 1 TABLET BY MOUTH EVERY DAY     Gastroenterology: Proton Pump Inhibitors Passed - 03/27/2023 12:36 PM      Passed - Valid encounter within last 12 months    Recent Outpatient Visits           2 months ago COPD, frequent exacerbations [facility])   [place] [facility] [name], FNP   9 months ago Gastroenteritis   [place] [facility] [place], [place], PA-C   9 months ago Annual physical exam   [place] [facility] Merita Norton T, FNP   1 year ago Chronic idiopathic constipation   [facility] [name], FNP   1 year ago Chronic pansinusitis   [facility] Alfredia Ferguson, [place]

## 2023-04-15 DIAGNOSIS — J029 Acute pharyngitis, unspecified: Secondary | ICD-10-CM | POA: Diagnosis not present

## 2023-04-15 DIAGNOSIS — R0981 Nasal congestion: Secondary | ICD-10-CM | POA: Diagnosis not present

## 2023-04-15 DIAGNOSIS — H9203 Otalgia, bilateral: Secondary | ICD-10-CM | POA: Diagnosis not present

## 2023-04-23 ENCOUNTER — Other Ambulatory Visit: Payer: Self-pay | Admitting: Family Medicine

## 2023-04-23 DIAGNOSIS — F900 Attention-deficit hyperactivity disorder, predominantly inattentive type: Secondary | ICD-10-CM

## 2023-04-23 NOTE — Telephone Encounter (Signed)
Medication Refill -  Most Recent Primary Care Visit:  Provider: Merita Norton T  Department[facility]  Visit Type: MYCHART VIDEO VISIT  Date: 01/21/2023  Medication: amphetamine-dextroamphetamine (ADDERALL) 30 MG tablet  Has the patient contacted their pharmacy? No  Is this the correct pharmacy for this prescription? Yes  This is the patient's preferred pharmacy:  [facility] [address] [place] - [address] [place] [address] Phone: [phone] Fax: [phone]   Has the prescription been filled recently? No  Is the patient out of the medication? No Pt has enough medication to last until 04/25/2023.  Has the patient been seen for an appointment in the last year OR does the patient have an upcoming appointment? Yes  Can we respond through MyChart? No  Agent: Please be advised that Rx refills may take up to 3 business days. We ask that you follow-up with your pharmacy.

## 2023-04-24 NOTE — Telephone Encounter (Signed)
Requested medication (s) are due for refill today: yes  Requested medication (s) are on the active medication list: yes  Last refill:  03/25/23  Future visit scheduled: no  Notes to clinic:  Unable to refill per protocol, cannot delegate.      Requested Prescriptions  Pending Prescriptions Disp Refills   amphetamine-dextroamphetamine (ADDERALL) 30 MG tablet 60 tablet 0    Sig: Take 1 tablet by mouth 2 (two) times daily.     Not Delegated - Psychiatry:  Stimulants/ADHD Failed - 04/24/2023 11:40 AM      Failed - This refill cannot be delegated      Failed - Urine Drug Screen completed in last 360 days      Passed - Last BP in normal range    BP Readings from Last 1 Encounters:  12/07/21 112/63         Passed - Last Heart Rate in normal range    Pulse Readings from Last 1 Encounters:  12/07/21 68         Passed - Valid encounter within last 6 months    Recent Outpatient Visits           3 months ago COPD, frequent exacerbations (HCC)   [place] [facility] [name], FNP   10 months ago Gastroenteritis   [facility] [place], PA-C   10 months ago Annual physical exam   [facility] [name], FNP   1 year ago Chronic idiopathic constipation   [facility] [name], FNP   1 year ago Chronic pansinusitis   [facility] Alfredia Ferguson, [place]

## 2023-04-30 ENCOUNTER — Other Ambulatory Visit: Payer: Self-pay | Admitting: Family Medicine

## 2023-04-30 DIAGNOSIS — F900 Attention-deficit hyperactivity disorder, predominantly inattentive type: Secondary | ICD-10-CM

## 2023-04-30 NOTE — Telephone Encounter (Signed)
 Copied from CRM [id number]. Topic: Clinical - Medication Refill >> Apr 30, 2023  2:17 PM Phill Myron wrote: Most Recent Primary Care Visit:  Provider: Merita Norton T  [facility]  Visit Type: MYCHART VIDEO VISIT  Date: 01/21/2023  Medication: amphetamine-dextroamphetamine (ADDERALL) 30 MG tablet  Has the patient contacted their pharmacy? Yes (Agent: If no, request that the patient contact the pharmacy for the refill. If patient does not wish to contact the pharmacy document the reason why and proceed with request.) (Agent: If yes, when and what did the pharmacy advise?)  Is this the correct pharmacy for this prescription? Yes If no, delete pharmacy and type the correct one.  This is the patient's preferred pharmacy:  [facility] [address], [place] - [address] [place] [address] Phone: [phone] Fax: [phone]      Is the patient out of the medication? Yes  Has the patient been seen for an appointment in the last year OR does the patient have an upcoming appointment? Yes  Can we respond through MyChart? Yes  Agent: Please be advised that Rx refills may take up to 3 business days. We ask that you follow-up with your pharmacy.

## 2023-05-01 NOTE — Telephone Encounter (Signed)
 Requested medication (s) are due for refill today: Yes  Requested medication (s) are on the active medication list: Yes  Last refill:  1.14.25  Future visit scheduled: No - left message to call and make appointment.  Notes to clinic:  See request. Not delegated.    Requested Prescriptions  Pending Prescriptions Disp Refills   amphetamine-dextroamphetamine (ADDERALL) 30 MG tablet 60 tablet 0    Sig: Take 1 tablet by mouth 2 (two) times daily.     Not Delegated - Psychiatry:  Stimulants/ADHD Failed - 05/01/2023 10:37 AM      Failed - This refill cannot be delegated      Failed - Urine Drug Screen completed in last 360 days      Passed - Last BP in normal range    BP Readings from Last 1 Encounters:  12/07/21 112/63         Passed - Last Heart Rate in normal range    Pulse Readings from Last 1 Encounters:  12/07/21 68         Passed - Valid encounter within last 6 months    Recent Outpatient Visits           3 months ago COPD, frequent exacerbations (HCC)   [facility] [name], FNP   10 months ago Gastroenteritis    [facility] [place], [place], PA-C   10 months ago Annual physical exam   [facility] [name], FNP   1 year ago Chronic idiopathic constipation   [facility] [name], FNP   1 year ago Chronic pansinusitis   [facility] Alfredia Ferguson, [place]

## 2023-05-02 ENCOUNTER — Telehealth: Payer: Self-pay

## 2023-05-02 DIAGNOSIS — F900 Attention-deficit hyperactivity disorder, predominantly inattentive type: Secondary | ICD-10-CM

## 2023-05-02 NOTE — Telephone Encounter (Signed)
 Copied from CRM [id number]. Topic: Clinical - Prescription Issue >> May 02, 2023 10:37 AM Shelah Lewandowsky wrote: Reason for CRM: amphetamine-dextroamphetamine (ADDERALL) 30 MG tablet have been waiting over a week for refill, please call patient [phone]

## 2023-05-03 MED ORDER — AMPHETAMINE-DEXTROAMPHETAMINE 30 MG PO TABS: 1.0000 | ORAL_TABLET | Freq: Two times a day (BID) | ORAL | 0 refills | Status: DC

## 2023-05-03 NOTE — Telephone Encounter (Signed)
 Prescription was sent to pharmacy, but she needs to schedule appointment with a provider who is taking new patients.

## 2023-05-10 DIAGNOSIS — J208 Acute bronchitis due to other specified organisms: Secondary | ICD-10-CM | POA: Diagnosis not present

## 2023-05-10 DIAGNOSIS — B9689 Other specified bacterial agents as the cause of diseases classified elsewhere: Secondary | ICD-10-CM | POA: Diagnosis not present

## 2023-06-03 ENCOUNTER — Other Ambulatory Visit: Payer: Self-pay | Admitting: Physician Assistant

## 2023-06-03 DIAGNOSIS — G8929 Other chronic pain: Secondary | ICD-10-CM

## 2023-06-03 DIAGNOSIS — F900 Attention-deficit hyperactivity disorder, predominantly inattentive type: Secondary | ICD-10-CM

## 2023-06-03 NOTE — Telephone Encounter (Signed)
 Copied from CRM [id number]. Topic: Clinical - Medication Refill >> Jun 03, 2023  3:23 PM Patsy Lager T wrote: Most Recent Primary Care Visit:  Provider: Merita Norton T  Department: [facility]  Visit Type: MYCHART VIDEO VISIT  Date: 01/21/2023  Medication: oxyCODONE-acetaminophen (ENDOCET) 7.5-325 MG tablet amphetamine-dextroamphetamine (ADDERALL) 30 MG tablet   Has the patient contacted their pharmacy? No  Is this the correct pharmacy for this prescription? Yes  This is the patient's preferred pharmacy:  [facility] [address], [place] - [address] [place] [address] Phone: [phone] Fax: [phone]   Has the prescription been filled recently? Yes  Is the patient out of the medication? Yes  Has the patient been seen for an appointment in the last year OR does the patient have an upcoming appointment? Yes  Can we respond through MyChart? Yes  Agent: Please be advised that Rx refills may take up to 3 business days. We ask that you follow-up with your pharmacy.

## 2023-06-05 MED ORDER — OXYCODONE-ACETAMINOPHEN 7.5-325 MG PO TABS
1.0000 | ORAL_TABLET | Freq: Two times a day (BID) | ORAL | 0 refills | Status: DC
Start: 2023-06-05 — End: 2023-07-23

## 2023-06-05 MED ORDER — AMPHETAMINE-DEXTROAMPHETAMINE 30 MG PO TABS: 1.0000 | ORAL_TABLET | Freq: Two times a day (BID) | ORAL | 0 refills | Status: DC

## 2023-06-05 NOTE — Telephone Encounter (Signed)
 Requested medication (s) are due for refill today: yes  Requested medication (s) are on the active medication list: yes  Last refill:  Adderall: 05/03/23 #60    oxycodone- acetaminophen: 03/15/23 #60  Future visit scheduled: yes  Notes to clinic:  both meds not delegated to NT  to reorder   Requested Prescriptions  Pending Prescriptions Disp Refills   amphetamine-dextroamphetamine (ADDERALL) 30 MG tablet 60 tablet 0    Sig: Take 1 tablet by mouth 2 (two) times daily.     Not Delegated - Psychiatry:  Stimulants/ADHD Failed - 06/05/2023  8:49 AM      Failed - This refill cannot be delegated      Failed - Urine Drug Screen completed in last 360 days      Failed - Valid encounter within last 6 months    Recent Outpatient Visits   None            Passed - Last BP in normal range    BP Readings from Last 1 Encounters:  12/07/21 112/63         Passed - Last Heart Rate in normal range    Pulse Readings from Last 1 Encounters:  12/07/21 68          oxyCODONE-acetaminophen (ENDOCET) 7.5-325 MG tablet 60 tablet 0    Sig: Take 1 tablet by mouth 2 (two) times daily.     Not Delegated - Analgesics:  Opioid Agonist Combinations Failed - 06/05/2023  8:49 AM      Failed - This refill cannot be delegated      Failed - Urine Drug Screen completed in last 360 days      Failed - Valid encounter within last 3 months    Recent Outpatient Visits   None

## 2023-06-06 ENCOUNTER — Other Ambulatory Visit: Payer: Self-pay | Admitting: Physician Assistant

## 2023-06-06 DIAGNOSIS — R7303 Prediabetes: Secondary | ICD-10-CM

## 2023-06-06 DIAGNOSIS — Z833 Family history of diabetes mellitus: Secondary | ICD-10-CM

## 2023-06-06 DIAGNOSIS — G8929 Other chronic pain: Secondary | ICD-10-CM

## 2023-06-06 NOTE — Telephone Encounter (Signed)
 Copied from CRM [id number]. Topic: Clinical - Medication Refill >> Jun 06, 2023 12:14 PM Gery Pray wrote: Most Recent Primary Care Visit:  Provider: Merita Norton T  Department: [facility]  Visit Type: MYCHART VIDEO VISIT  Date: 01/21/2023  Medication: oxyCODONE-acetaminophen (ENDOCET) 7.5-325 MG tablet  Has the patient contacted their pharmacy? Yes (Agent: If no, request that the patient contact the pharmacy for the refill. If patient does not wish to contact the pharmacy document the reason why and proceed with request.) (Agent: If yes, when and what did the pharmacy advise?) Have not received the prescription  Is this the correct pharmacy for this prescription? Yes If no, delete pharmacy and type the correct one.  This is the patient's preferred pharmacy:  [facility] [address], [place] - [address] [place] [address] Phone: [phone] Fax: [phone]  Has the prescription been filled recently? No  Is the patient out of the medication? Yes  Has the patient been seen for an appointment in the last year OR does the patient have an upcoming appointment? Yes  Can we respond through MyChart? Yes  Agent: Please be advised that Rx refills may take up to 3 business days. We ask that you follow-up with your pharmacy.

## 2023-06-09 NOTE — Telephone Encounter (Signed)
 Requested medication (s) are due for refill today: Yes  Requested medication (s) are on the active medication list: Yes  Last refill:  06/05/23  Future visit scheduled: Yes  Notes to clinic:  Unable to refill per protocol, cannot delegate.      Requested Prescriptions  Pending Prescriptions Disp Refills   oxyCODONE-acetaminophen (ENDOCET) 7.5-325 MG tablet 60 tablet 0    Sig: Take 1 tablet by mouth 2 (two) times daily.     Not Delegated - Analgesics:  Opioid Agonist Combinations Failed - 06/09/2023  5:53 PM      Failed - This refill cannot be delegated      Failed - Urine Drug Screen completed in last 360 days      Failed - Valid encounter within last 3 months    Recent Outpatient Visits   None

## 2023-06-10 ENCOUNTER — Telehealth (INDEPENDENT_AMBULATORY_CARE_PROVIDER_SITE_OTHER): Admitting: Physician Assistant

## 2023-06-10 DIAGNOSIS — J329 Chronic sinusitis, unspecified: Secondary | ICD-10-CM

## 2023-06-10 DIAGNOSIS — J441 Chronic obstructive pulmonary disease with (acute) exacerbation: Secondary | ICD-10-CM

## 2023-06-10 DIAGNOSIS — F17200 Nicotine dependence, unspecified, uncomplicated: Secondary | ICD-10-CM

## 2023-06-10 MED ORDER — AMOXICILLIN 875 MG PO TABS
875.0000 mg | ORAL_TABLET | Freq: Two times a day (BID) | ORAL | 0 refills | Status: AC
Start: 2023-06-10 — End: 2023-06-20

## 2023-06-10 MED ORDER — FLUCONAZOLE 150 MG PO TABS: 150.0000 mg | ORAL_TABLET | Freq: Once | ORAL | 0 refills | Status: AC

## 2023-06-10 NOTE — Progress Notes (Unsigned)
 MyChart Video Visit  Virtual Visit via Video Note   This format is felt to be most appropriate for this patient at this time. Physical exam was limited by quality of the video and audio technology used for the visit.   Provider location: Virtual Visit Location Provider: Office/Clinic Patient Location: Home  I discussed the limitations of evaluation and management by telemedicine and the availability of in person appointments. The patient expressed understanding and agreed to proceed.  Patient: Kathleen Buchanan   DOB: 10-15-1965   58 y.o. Female  MRN: [id number] Visit Date: 06/10/2023  Today's healthcare provider: Debera Lat, PA-C   No chief complaint on file.  Subjective    HPI  *** Discussed the use of AI scribe software for clinical note transcription with the patient, who gave verbal consent to proceed.  History of Present Illness      Medications: Outpatient Medications Prior to Visit  Medication Sig  . albuterol (VENTOLIN HFA) 108 (90 Base) MCG/ACT inhaler INHALE 2 PUFFS INTO THE LUNGS EVERY 4 HOURS AS NEEDED FOR WHEEZE OR FOR SHORTNESS OF BREATH  . amoxicillin-clavulanate (AUGMENTIN) 875-125 MG tablet Take 1 tablet by mouth 2 (two) times daily.  Marland Kitchen amphetamine-dextroamphetamine (ADDERALL) 30 MG tablet Take 1 tablet by mouth 2 (two) times daily.  . Aspirin-Caffeine (BC FAST PAIN RELIEF PO) Take by mouth as needed.  . Blood Glucose Monitoring Suppl (ONETOUCH VERIO) w/Device KIT To check blood sugar daily for diabetes  . busPIRone (BUSPAR) 5 MG tablet Take 1 tablet (5 mg total) by mouth 3 (three) times daily as needed.  . cetirizine (ZYRTEC) 10 MG tablet Take 1 tablet (10 mg total) by mouth daily.  . diazepam (VALIUM) 2 MG tablet TAKE 1 TABLET BY MOUTH EVERY 12 HOURS AS NEEDED FOR ANXIETY (Patient not taking: Reported on 01/21/2023)  . EPINEPHrine 0.3 mg/0.3 mL IJ SOAJ injection Inject 0.3 mg into the muscle as needed. (Patient not taking: Reported on 01/21/2023)  .  ezetimibe (ZETIA) 10 MG tablet Take 1 tablet (10 mg total) by mouth daily.  . hydrochlorothiazide (HYDRODIURIL) 12.5 MG tablet TAKE 1 TABLET BY MOUTH EVERY DAY  . ibuprofen (ADVIL,MOTRIN) 200 MG tablet Take 4 tablets by mouth as needed.  . lactulose (CHRONULAC) 10 GM/15ML solution Take 15 mLs (10 g total) by mouth 2 (two) times daily as needed for severe constipation.  . Lancets (ONETOUCH ULTRASOFT) lancets To check blood sugar daily for diabetes  . levothyroxine (SYNTHROID) 100 MCG tablet Take 1 tablet by mouth once daily  . methylPREDNISolone (MEDROL DOSEPAK) 4 MG TBPK tablet As directed on package; take with food  . ONETOUCH VERIO test strip TO CHECK BLOOD SUGAR DAILY FOR DIABETES  . oxyCODONE-acetaminophen (ENDOCET) 7.5-325 MG tablet Take 1 tablet by mouth 2 (two) times daily.  . pantoprazole (PROTONIX) 40 MG tablet TAKE 1 TABLET BY MOUTH EVERY DAY  . Vitamin D, Ergocalciferol, (DRISDOL) 1.25 MG (50000 UNIT) CAPS capsule Take 1 capsule (50,000 Units total) by mouth every 7 (seven) days.   No facility-administered medications prior to visit.    Review of Systems  All other systems reviewed and are negative. All negative  Except see HPI   {Insert previous labs (optional):23779} {See past labs  Heme  Chem  Endocrine  Serology  Results Review (optional):1}   Objective    There were no vitals taken for this visit.  {Insert last BP/Wt (optional):23777}{See vitals history (optional):1}     Physical Exam Constitutional:      General: She is  not in acute distress.    Appearance: Normal appearance.  HENT:     Head: Normocephalic.  Pulmonary:     Effort: Pulmonary effort is normal. No respiratory distress.  Neurological:     Mental Status: She is alert and oriented to person, place, and time. Mental status is at baseline.    Constitutional:      General: She is not in acute distress.    Appearance: Normal appearance.  HENT:     Head: Normocephalic.  Pulmonary:      Effort: Pulmonary effort is normal. No respiratory distress.  Neurological:     Mental Status: She is alert and oriented to person, place, and time. Mental status is at baseline.      Assessment and Plan Assessment & Plan      ***  No follow-ups on file.     I discussed the assessment and treatment plan with the patient. The patient was provided an opportunity to ask questions and all were answered. The patient agreed with the plan and demonstrated an understanding of the instructions.   The patient was advised to call back or seek an in-person evaluation if the symptoms worsen or if the condition fails to improve as anticipated.  I, Debera Lat, PA-C have reviewed all documentation for this visit. The documentation on 06/10/2023  for the exam, diagnosis, procedures, and orders are all accurate and complete.  Debera Lat, [facility], MMS [facility] [phone] (phone) [phone] (fax)  [facility]

## 2023-06-29 ENCOUNTER — Other Ambulatory Visit: Payer: Self-pay | Admitting: Family Medicine

## 2023-06-29 DIAGNOSIS — K219 Gastro-esophageal reflux disease without esophagitis: Secondary | ICD-10-CM

## 2023-07-09 ENCOUNTER — Other Ambulatory Visit: Payer: Self-pay | Admitting: Physician Assistant

## 2023-07-09 DIAGNOSIS — G8929 Other chronic pain: Secondary | ICD-10-CM

## 2023-07-09 NOTE — Telephone Encounter (Unsigned)
 Copied from CRM [id number]. Topic: Clinical - Medication Refill >> Jul 09, 2023 11:01 AM Stanly Early wrote: Most Recent Primary Care Visit:  Provider: OSTWALT, JANNA  Department: [facility]  Visit Type: ACUTE  Date: 06/10/2023  Medication: amphetamine -dextroamphetamine  (ADDERALL) 30 MG tablet,oxyCODONE -acetaminophen  (ENDOCET) 7.5-325 MG table,pantoprazole  (PROTONIX ) 40 MG tablet, (patient still has about 5-6 of the pantoprazole  left)  Has the patient contacted their pharmacy? Yes (Agent: If no, request that the patient contact the pharmacy for the refill. If patient does not wish to contact the pharmacy document the reason why and proceed with request.) (Agent: If yes, when and what did the pharmacy advise?)  Is this the correct pharmacy for this prescription? Yes If no, delete pharmacy and type the correct one.  This is the patient's preferred pharmacy:  [facility] [address] [place] - [address] [place] [address] Phone: [phone] Fax: [phone]  [id number] [place] [place] - [address] AT [facility] [address] [place] [address] Phone: [phone] Fax: [phone]   Has the prescription been filled recently? Yes  Is the patient out of the medication? Yes  Has the patient been seen for an appointment in the last year OR does the patient have an upcoming appointment? Yes  Can we respond through MyChart? Yes  Agent: Please be advised that Rx refills may take up to 3 business days. We ask that you follow-up with your pharmacy.

## 2023-07-15 ENCOUNTER — Other Ambulatory Visit: Payer: Self-pay

## 2023-07-15 DIAGNOSIS — G8929 Other chronic pain: Secondary | ICD-10-CM

## 2023-07-15 DIAGNOSIS — K219 Gastro-esophageal reflux disease without esophagitis: Secondary | ICD-10-CM

## 2023-07-15 DIAGNOSIS — F900 Attention-deficit hyperactivity disorder, predominantly inattentive type: Secondary | ICD-10-CM

## 2023-07-15 MED ORDER — PANTOPRAZOLE SODIUM 40 MG PO TBEC: 40.0000 mg | DELAYED_RELEASE_TABLET | Freq: Every day | ORAL | 0 refills | Status: DC

## 2023-07-15 NOTE — Telephone Encounter (Signed)
 Copied from CRM [id number]. Topic: Clinical - Medication Refill >> Jul 09, 2023 11:01 AM Stanly Early wrote: Most Recent Primary Care Visit:  Provider: OSTWALT, JANNA  Department: [facility]  Visit Type: ACUTE  Date: 06/10/2023  Medication: amphetamine -dextroamphetamine  (ADDERALL) 30 MG tablet,oxyCODONE -acetaminophen  (ENDOCET) 7.5-325 MG table,pantoprazole  (PROTONIX ) 40 MG tablet, (patient still has about 5-6 of the pantoprazole  left)  Has the patient contacted their pharmacy? Yes (Agent: If no, request that the patient contact the pharmacy for the refill. If patient does not wish to contact the pharmacy document the reason why and proceed with request.) (Agent: If yes, when and what did the pharmacy advise?)  Is this the correct pharmacy for this prescription? Yes If no, delete pharmacy and type the correct one.  This is the patient's preferred pharmacy:  [facility] [address], [place] - [address] [place] [address] Phone: [phone] Fax: [phone]  [id number] - [place], [place] - [address] AT [facility] [address] [place] [address] Phone: [phone] Fax: [phone]   Has the prescription been filled recently? Yes  Is the patient out of the medication? Yes  Has the patient been seen for an appointment in the last year OR does the patient have an upcoming appointment? Yes  Can we respond through MyChart? Yes  Agent: Please be advised that Rx refills may take up to 3 business days. We ask that you follow-up with your pharmacy. >> Jul 15, 2023 12:13 PM DeAngela L wrote: Patient calling about medication refill that was submitted last week and she still have not received the medication refill yet The patient is out of medication and her preferred pharmacy is  [facility] [address] [place] - [address] [place] [address] Phone: [phone] Fax: [phone]  Patient call back  number [phone]

## 2023-07-17 NOTE — Telephone Encounter (Signed)
 Requested medication (s) are due for refill today: yes  Requested medication (s) are on the active medication list: yes  Last refill:  06/05/23  Future visit scheduled: no  Notes to clinic:  Unable to refill per protocol, cannot delegate. Patient inquiring about refill, with out medication. Please advise patient.      Requested Prescriptions  Pending Prescriptions Disp Refills   amphetamine -dextroamphetamine  (ADDERALL) 30 MG tablet 60 tablet 0    Sig: Take 1 tablet by mouth 2 (two) times daily.     Not Delegated - Psychiatry:  Stimulants/ADHD Failed - 07/17/2023  1:23 PM      Failed - This refill cannot be delegated      Failed - Urine Drug Screen completed in last 360 days      Failed - Valid encounter within last 6 months    Recent Outpatient Visits           1 month ago Other sinusitis, unspecified chronicity   [facility] [place], Janna, PA-C              Passed - Last BP in normal range    BP Readings from Last 1 Encounters:  12/07/21 112/63         Passed - Last Heart Rate in normal range    Pulse Readings from Last 1 Encounters:  12/07/21 68          oxyCODONE -acetaminophen  (ENDOCET) 7.5-325 MG tablet 60 tablet 0    Sig: Take 1 tablet by mouth 2 (two) times daily.     Not Delegated - Analgesics:  Opioid Agonist Combinations Failed - 07/17/2023  1:23 PM      Failed - This refill cannot be delegated      Failed - Urine Drug Screen completed in last 360 days      Failed - Valid encounter within last 3 months    Recent Outpatient Visits           1 month ago Other sinusitis, unspecified chronicity   [facility] [place], [place], PA-C              Signed Prescriptions Disp Refills   pantoprazole  (PROTONIX ) 40 MG tablet 90 tablet 0    Sig: Take 1 tablet (40 mg total) by mouth daily.     There is no refill protocol information for this order

## 2023-07-17 NOTE — Telephone Encounter (Signed)
 Pt called to report that she has been out of her medication since the 27th and 29th of April. Pt wants to know why her meds are still pending

## 2023-07-22 ENCOUNTER — Ambulatory Visit: Payer: Self-pay

## 2023-07-22 DIAGNOSIS — Z0000 Encounter for general adult medical examination without abnormal findings: Secondary | ICD-10-CM

## 2023-07-22 MED ORDER — AMPHETAMINE-DEXTROAMPHETAMINE 30 MG PO TABS: 1.0000 | ORAL_TABLET | Freq: Two times a day (BID) | ORAL | 0 refills | Status: DC

## 2023-07-22 NOTE — Progress Notes (Signed)
 Subjective:   Kathleen Buchanan is a 58 y.o. who presents for a Medicare Wellness preventive visit.  As a reminder, Annual Wellness Visits don't include a physical exam, and some assessments may be limited, especially if this visit is performed virtually. We may recommend an in-person visit if needed.  Visit Complete: Virtual I connected with  Kathleen Buchanan on 07/22/23 by a audio enabled telemedicine application and verified that I am speaking with the correct person using two identifiers.  Patient Location: Home  Provider Location: Office/Clinic  I discussed the limitations of evaluation and management by telemedicine. The patient expressed understanding and agreed to proceed.  Vital Signs: Because this visit was a virtual/telehealth visit, some criteria may be missing or patient reported. Any vitals not documented were not able to be obtained and vitals that have been documented are patient reported.  VideoDeclined- This patient declined Librarian, academic. Therefore the visit was completed with audio only.  Persons Participating in Visit: Patient.  AWV Questionnaire: No: Patient Medicare AWV questionnaire was not completed prior to this visit.  Cardiac Risk Factors include: advanced age (>13men, >44 women);obesity (BMI >30kg/m2);sedentary lifestyle;smoking/ tobacco exposure     Objective:     Today's Vitals   07/22/23 1545  PainSc: 5    There is no height or weight on file to calculate BMI.     07/22/2023    3:51 PM 07/10/2022    1:13 PM 07/05/2021    1:08 PM 12/14/2019    2:12 PM 11/25/2018    2:07 PM 11/08/2016    1:41 PM 07/25/2015    7:07 PM  Advanced Directives  Does Patient Have a Medical Advance Directive? No No No No No No No  Would patient like information on creating a medical advance directive? No - Patient declined  No - Patient declined No - Patient declined No - Patient declined No - Patient declined No - patient declined  information    Current Medications (verified) Outpatient Encounter Medications as of 07/22/2023  Medication Sig   albuterol  (VENTOLIN  HFA) 108 (90 Base) MCG/ACT inhaler INHALE 2 PUFFS INTO THE LUNGS EVERY 4 HOURS AS NEEDED FOR WHEEZE OR FOR SHORTNESS OF BREATH   amphetamine -dextroamphetamine  (ADDERALL) 30 MG tablet Take 1 tablet by mouth 2 (two) times daily.   Aspirin-Caffeine (BC FAST PAIN RELIEF PO) Take by mouth as needed.   Blood Glucose Monitoring Suppl (ONETOUCH VERIO) w/Device KIT To check blood sugar daily for diabetes   cetirizine  (ZYRTEC ) 10 MG tablet Take 1 tablet (10 mg total) by mouth daily.   ezetimibe  (ZETIA ) 10 MG tablet Take 1 tablet (10 mg total) by mouth daily.   hydrochlorothiazide  (HYDRODIURIL ) 12.5 MG tablet TAKE 1 TABLET BY MOUTH EVERY DAY   ibuprofen (ADVIL,MOTRIN) 200 MG tablet Take 4 tablets by mouth as needed.   lactulose  (CHRONULAC ) 10 GM/15ML solution Take 15 mLs (10 g total) by mouth 2 (two) times daily as needed for severe constipation.   Lancets (ONETOUCH ULTRASOFT) lancets To check blood sugar daily for diabetes   levothyroxine  (SYNTHROID ) 100 MCG tablet Take 1 tablet by mouth once daily   ONETOUCH VERIO test strip TO CHECK BLOOD SUGAR DAILY FOR DIABETES   oxyCODONE -acetaminophen  (ENDOCET) 7.5-325 MG tablet Take 1 tablet by mouth 2 (two) times daily.   pantoprazole  (PROTONIX ) 40 MG tablet Take 1 tablet (40 mg total) by mouth daily.   Vitamin D , Ergocalciferol , (DRISDOL ) 1.25 MG (50000 UNIT) CAPS capsule Take 1 capsule (50,000 Units total) by  mouth every 7 (seven) days.   busPIRone  (BUSPAR ) 5 MG tablet Take 1 tablet (5 mg total) by mouth 3 (three) times daily as needed. (Patient not taking: Reported on 07/22/2023)   methylPREDNISolone  (MEDROL  DOSEPAK) 4 MG TBPK tablet As directed on package; take with food (Patient not taking: Reported on 07/22/2023)   [DISCONTINUED] amphetamine -dextroamphetamine  (ADDERALL) 30 MG tablet Take 1 tablet by mouth 2 (two) times daily.    No facility-administered encounter medications on file as of 07/22/2023.    Allergies (verified) Macrobid [nitrofurantoin macrocrystal], Codeine, Doxycycline , Erythromycin, Hydrocodone-acetaminophen , Lansoprazole, Levofloxacin, Migraine formula  [aspirin-acetaminophen -caffeine], Morphine sulfate, Omeprazole, Pepcid  [famotidine], and Sulfa antibiotics   History: Past Medical History:  Diagnosis Date   Asthma    COPD (chronic obstructive pulmonary disease) (HCC)    Past Surgical History:  Procedure Laterality Date   ABLATION  2011   BREAST REDUCTION SURGERY Bilateral 08/1991   CESAREAN SECTION     X 2   REDUCTION MAMMAPLASTY Bilateral 1990?   TUBAL LIGATION  1999   Family History  Problem Relation Age of Onset   Lung cancer Father    Ulcers Father    Cirrhosis Father    Colon polyps Maternal Aunt    Ulcers Mother    Colon polyps Mother    Diabetes Mother    Hyperlipidemia Mother    ADD / ADHD Mother    Aneurysm Maternal Grandmother    Throat cancer Maternal Grandfather    Social History   Socioeconomic History   Marital status: Divorced    Spouse name: Not on file   Number of children: 2   Years of education: Not on file   Highest education level: 11th grade  Occupational History   Occupation: disability  Tobacco Use   Smoking status: Every Day    Current packs/day: 0.25    Types: Cigarettes   Smokeless tobacco: Never  Vaping Use   Vaping status: Never Used  Substance and Sexual Activity   Alcohol  use: No   Drug use: No   Sexual activity: Not on file  Other Topics Concern   Not on file  Social History Narrative   Not on file   Social Drivers of Health   Financial Resource Strain: Low Risk  (07/22/2023)   Overall Financial Resource Strain (CARDIA)    Difficulty of Paying Living Expenses: Not hard at all  Food Insecurity: No Food Insecurity (07/22/2023)   Hunger Vital Sign    Worried About Running Out of Food in the Last Year: Never true    Ran Out of  Food in the Last Year: Never true  Transportation Needs: No Transportation Needs (07/22/2023)   PRAPARE - Administrator, Civil Service (Medical): No    Lack of Transportation (Non-Medical): No  Physical Activity: Inactive (07/22/2023)   Exercise Vital Sign    Days of Exercise per Week: 0 days    Minutes of Exercise per Session: 0 min  Stress: No Stress Concern Present (07/22/2023)   [employer] of Occupational Health - Occupational Stress Questionnaire    Feeling of Stress : Only a little  Social Connections: Socially Isolated (07/22/2023)   Social Connection and Isolation Panel [NHANES]    Frequency of Communication with Friends and Family: Once a week    Frequency of Social Gatherings with Friends and Family: Never    Attends Religious Services: Never    Database administrator or Organizations: No    Attends Banker Meetings: Never  Marital Status: Divorced    Tobacco Counseling Ready to quit: Not Answered Counseling given: Not Answered    Clinical Intake:  Pre-visit preparation completed: Yes  Pain : 0-10 Pain Score: 5  Pain Type: Chronic pain Pain Location: Head Pain Radiating Towards: migraine Pain Descriptors / Indicators: Aching, Pounding Pain Onset: More than a month ago Pain Frequency: Intermittent     BMI - recorded: 36.6 Nutritional Status: BMI > 30  Obese Nutritional Risks: None Diabetes: No  Lab Results  Component Value Date   HGBA1C 5.7 (H) 06/10/2022   HGBA1C 5.7 (H) 06/01/2021   HGBA1C 5.6 06/01/2020     How often do you need to have someone help you when you read instructions, pamphlets, or other written materials from your doctor or pharmacy?: 1 - Never  Interpreter Needed?: No  Information entered by :: Dellie Fergusson, LPN   Activities of Daily Living     07/22/2023    3:52 PM  In your present state of health, do you have any difficulty performing the following activities:  Hearing? 0  Vision? 0   Difficulty concentrating or making decisions? 0  Walking or climbing stairs? 1  Comment BACK PAIN  Dressing or bathing? 0  Doing errands, shopping? 0  Preparing Food and eating ? N  Using the Toilet? N  In the past six months, have you accidently leaked urine? N  Do you have problems with loss of bowel control? N  Managing your Medications? N  Managing your Finances? N  Housekeeping or managing your Housekeeping? N    Patient Care Team: Ostwalt, Janna, PA-C as PCP - General (Physician Assistant) Margean Sheehan, MD as Referring Physician (Orthopedic Surgery)  Indicate any recent Medical Services you may have received from other than [facility] providers in the past year (date may be approximate).     Assessment:    This is a routine wellness examination for Kathleen Buchanan.  Hearing/Vision screen Hearing Screening - Comments:: NO AIDS Vision Screening - Comments:: WEARS GLASSES ALL DAY- [facility] IN [place]   Goals Addressed             This Visit's Progress    DIET - INCREASE WATER INTAKE         Depression Screen     07/22/2023    3:50 PM 01/21/2023    3:55 PM 07/10/2022    1:10 PM 06/10/2022    3:08 PM 10/01/2021   10:33 AM 07/05/2021    1:06 PM 02/06/2021    1:56 PM  PHQ 2/9 Scores  PHQ - 2 Score 0 0 0 0 0 0 0  PHQ- 9 Score 0 0 0 3 4  0    Fall Risk     07/22/2023    3:52 PM 07/08/2022    1:07 PM 06/10/2022    3:08 PM 07/05/2021    1:09 PM 11/27/2020    1:41 PM  Fall Risk   Falls in the past year? 0 0 0 0 0  Comment     Reports last fall occured in April 2021  Number falls in past yr: 0 0 0 0 0  Injury with Fall? 0 0 0 0   Risk for fall due to : No Fall Risks  No Fall Risks No Fall Risks Medication side effect;Other (Comment)  Risk for fall due to: Comment     Frequent edema to right ankle  Follow up Falls prevention discussed;Falls evaluation completed  Falls evaluation completed Falls evaluation completed Falls prevention discussed  MEDICARE RISK AT HOME:   Medicare Risk at Home Any stairs in or around the home?: Yes If so, are there any without handrails?: Yes Home free of loose throw rugs in walkways, pet beds, electrical cords, etc?: Yes Adequate lighting in your home to reduce risk of falls?: Yes Life alert?: No Use of a cane, walker or w/c?: No Grab bars in the bathroom?: No Shower chair or bench in shower?: No Elevated toilet seat or a handicapped toilet?: No  TIMED UP AND GO:  Was the test performed?  No  Cognitive Function: 6CIT completed        07/22/2023    3:54 PM 07/10/2022    1:15 PM 12/14/2019    2:17 PM  6CIT Screen  What Year? 0 points 0 points 0 points  What month? 0 points 0 points 0 points  What time? 0 points 0 points 0 points  Count back from 20 0 points 0 points 0 points  Months in reverse 0 points 0 points 0 points  Repeat phrase 0 points 0 points 0 points  Total Score 0 points 0 points 0 points    Immunizations Immunization History  Administered Date(s) Administered   Tdap 06/23/2019    Screening Tests Health Maintenance  Topic Date Due   COVID-19 Vaccine (1) Never done   Pneumococcal Vaccine 10-68 Years old (1 of 2 - PCV) Never done   Zoster Vaccines- Shingrix (1 of 2) Never done   Cervical Cancer Screening (HPV/Pap Cotest)  Never done   Fecal DNA (Cologuard)  Never done   Lung Cancer Screening  Never done   MAMMOGRAM  04/26/2018   HIV Screening  03/11/2026 (Originally 05/02/1980)   INFLUENZA VACCINE  10/10/2023   Medicare Annual Wellness (AWV)  07/21/2024   DTaP/Tdap/Td (2 - Td or Tdap) 06/22/2029   HPV VACCINES  Aged Out   Meningococcal B Vaccine  Aged Out    Health Maintenance  Health Maintenance Due  Topic Date Due   COVID-19 Vaccine (1) Never done   Pneumococcal Vaccine 58-57 Years old (1 of 2 - PCV) Never done   Zoster Vaccines- Shingrix (1 of 2) Never done   Cervical Cancer Screening (HPV/Pap Cotest)  Never done   Fecal DNA (Cologuard)  Never done   Lung Cancer Screening   Never done   MAMMOGRAM  04/26/2018   Health Maintenance Items Addressed: UP TO DATE ON TDAP, TAKES NO OTHER SHOTS; DECLINES ALL TESTING  Additional Screening:  Vision Screening: Recommended annual ophthalmology exams for early detection of glaucoma and other disorders of the eye.  Dental Screening: Recommended annual dental exams for proper oral hygiene  Community Resource Referral / Chronic Care Management: CRR required this visit?  No   CCM required this visit?  No   Plan:    I have personally reviewed and noted the following in the patient's chart:   Medical and social history Use of alcohol , tobacco or illicit drugs  Current medications and supplements including opioid prescriptions. Patient is currently taking opioid prescriptions. Information provided to patient regarding non-opioid alternatives. Patient advised to discuss non-opioid treatment plan with their provider. Functional ability and status Nutritional status Physical activity Advanced directives List of other physicians Hospitalizations, surgeries, and ER visits in previous 12 months Vitals Screenings to include cognitive, depression, and falls Referrals and appointments  In addition, I have reviewed and discussed with patient certain preventive protocols, quality metrics, and best practice recommendations. A written personalized care plan for preventive services as well  as general preventive health recommendations were provided to patient.   Pinky Bright, LPN   1/61/0960   After Visit Summary: (MyChart) Due to this being a telephonic visit, the after visit summary with patients personalized plan was offered to patient via MyChart   Notes: Nothing significant to report at this time.

## 2023-07-22 NOTE — Telephone Encounter (Signed)
 A courtesy refill. Needs to schedule a in -person follow-up before the next refill

## 2023-07-22 NOTE — Patient Instructions (Addendum)
 Kathleen Buchanan , Thank you for taking time out of your busy schedule to complete your Annual Wellness Visit with me. I enjoyed our conversation and look forward to speaking with you again next year. I, as well as your care team,  appreciate your ongoing commitment to your health goals. Please review the following plan we discussed and let me know if I can assist you in the future.  Follow up Visits: Next Medicare AWV with our clinical staff:   07/28/24 @ 3:10 PM BY PHONE Have you seen your provider in the last 6 months (3 months if uncontrolled diabetes)? Yes  Clinician Recommendations:  Aim for 30 minutes of exercise or brisk walking, 6-8 glasses of water, and 5 servings of fruits and vegetables each day. TAKE CARE OF YOURSELF!      This is a list of the screening recommended for you and due dates:  Health Maintenance  Topic Date Due   COVID-19 Vaccine (1) Never done   Pneumococcal Vaccination (1 of 2 - PCV) Never done   Zoster (Shingles) Vaccine (1 of 2) Never done   Pap with HPV screening  Never done   Cologuard (Stool DNA test)  Never done   Screening for Lung Cancer  Never done   Mammogram  04/26/2018   HIV Screening  03/11/2026*   Flu Shot  10/10/2023   Medicare Annual Wellness Visit  07/21/2024   DTaP/Tdap/Td vaccine (2 - Td or Tdap) 06/22/2029   HPV Vaccine  Aged Out   Meningitis B Vaccine  Aged Out  *Topic was postponed. The date shown is not the original due date.    Advanced directives: (ACP Link)Information on Advanced Care Planning can be found at [place]  Secretary of [facility] Advance Health Care Directives Advance Health Care Directives. [email or web address]  Advance Care Planning is important because it:  [x]  Makes sure you receive the medical care that is consistent with your values, goals, and preferences  [x]  It provides guidance to your family and loved ones and reduces their decisional burden about whether or not they are making the right decisions based on your  wishes.  Follow the link provided in your after visit summary or read over the paperwork we have mailed to you to help you started getting your Advance Directives in place. If you need assistance in completing these, please reach out to us  so that we can help you!

## 2023-07-23 ENCOUNTER — Telehealth: Payer: Self-pay | Admitting: Physician Assistant

## 2023-07-23 MED ORDER — OXYCODONE-ACETAMINOPHEN 7.5-325 MG PO TABS: 1.0000 | ORAL_TABLET | Freq: Two times a day (BID) | ORAL | 0 refills | Status: DC

## 2023-07-23 NOTE — Telephone Encounter (Unsigned)
 Copied from CRM [id number]. Topic: Clinical - Medication Refill >> Jul 23, 2023 12:03 PM Chrystal Crape R wrote: Medication: Medication: oxyCODONE -acetaminophen  (ENDOCET) 7.5-325 MG tablet  pantoprazole  (PROTONIX ) 40 MG tablet    Has the patient contacted their pharmacy? Yes (Agent: If no, request that the patient contact the pharmacy for the refill. If patient does not wish to contact the pharmacy document the reason why and proceed with request.) (Agent: If yes, when and what did the pharmacy advise?)  This is the patient's preferred pharmacy: yes [facility] [address] [place] - [address] [place] [address] Phone: [phone] Fax: [phone]   Is this the correct pharmacy for this prescription? Yes If no, delete pharmacy and type the correct one.   Has the prescription been filled recently? No  Is the patient out of the medication? Yes  Has the patient been seen for an appointment in the last year OR does the patient have an upcoming appointment? Yes  Can we respond through MyChart? No  Agent: Please be advised that Rx refills may take up to 3 business days. We ask that you follow-up with your pharmacy.

## 2023-07-23 NOTE — Telephone Encounter (Signed)
 Copied from CRM [id number]. Topic: Clinical - Medication Refill >> Jul 23, 2023 11:55 AM Jakyia R wrote: Medication:  oxyCODONE -acetaminophen  (ENDOCET) 7.5-325 MG tablet pantoprazole  (PROTONIX ) 40 MG tablet Has the patient contacted their pharmacy? Yes (Agent: If no, request that the patient contact the pharmacy for the refill. If patient does not wish to contact the pharmacy document the reason why and proceed with request.) (Agent: If yes, when and what did the pharmacy advise?)  This is the patient's preferred pharmacy:  [facility] [address], [place] - [address] [place] [address] Phone: [phone] Fax: [phone]  Is this the correct pharmacy for this prescription? Yes If no, delete pharmacy and type the correct one.   Has the prescription been filled recently? No  Is the patient out of the medication? Yes  Has the patient been seen for an appointment in the last year OR does the patient have an upcoming appointment? Yes  Can we respond through MyChart? No  Agent: Please be advised that Rx refills may take up to 3 business days. We ask that you follow-up with your pharmacy.

## 2023-07-23 NOTE — Telephone Encounter (Signed)
 Please, let pt know that we need to reassess using opioids for pain control long-term. Advised to schedule a follow-up app in person before the next refill.

## 2023-07-23 NOTE — Telephone Encounter (Signed)
 Requested medication (s) are due for refill today - yes  Requested medication (s) are on the active medication list -yes  Future visit scheduled -no  Last refill: 06/05/23 #60  Notes to clinic: non delegated Rx  Requested Prescriptions  Pending Prescriptions Disp Refills   oxyCODONE -acetaminophen  (ENDOCET) 7.5-325 MG tablet 60 tablet 0    Sig: Take 1 tablet by mouth 2 (two) times daily.     Not Delegated - Analgesics:  Opioid Agonist Combinations Failed - 07/23/2023 12:49 PM      Failed - This refill cannot be delegated      Failed - Urine Drug Screen completed in last 360 days      Passed - Valid encounter within last 3 months    Recent Outpatient Visits           1 month ago Other sinusitis, unspecified chronicity   [place] [facility] [place], [place], PA-C                 Requested Prescriptions  Pending Prescriptions Disp Refills   oxyCODONE -acetaminophen  (ENDOCET) 7.5-325 MG tablet 60 tablet 0    Sig: Take 1 tablet by mouth 2 (two) times daily.     Not Delegated - Analgesics:  Opioid Agonist Combinations Failed - 07/23/2023 12:49 PM      Failed - This refill cannot be delegated      Failed - Urine Drug Screen completed in last 360 days      Passed - Valid encounter within last 3 months    Recent Outpatient Visits           1 month ago Other sinusitis, unspecified chronicity   [place] [facility] [place], Janna, PA-C

## 2023-08-22 ENCOUNTER — Other Ambulatory Visit: Payer: Self-pay | Admitting: Physician Assistant

## 2023-08-22 DIAGNOSIS — F900 Attention-deficit hyperactivity disorder, predominantly inattentive type: Secondary | ICD-10-CM

## 2023-08-22 DIAGNOSIS — G8929 Other chronic pain: Secondary | ICD-10-CM

## 2023-08-22 NOTE — Telephone Encounter (Unsigned)
 Copied from CRM [id number]. Topic: Clinical - Medication Refill >> Aug 22, 2023  2:14 PM Felizardo Hotter wrote: Medication: amphetamine -dextroamphetamine  (ADDERALL) 30 MG tablet and oxyCODONE -acetaminophen  (ENDOCET) 7.5-325 MG tablet  Has the patient contacted their pharmacy? Yes (Agent: If no, request that the patient contact the pharmacy for the refill. If patient does not wish to contact the pharmacy document the reason why and proceed with request.) (Agent: If yes, when and what did the pharmacy advise?)  This is the patient's preferred pharmacy:  [facility] [address], [place] - [address] [place] [address] Phone: [phone] Fax: [phone]   Is this the correct pharmacy for this prescription? Yes If no, delete pharmacy and type the correct one.   Has the prescription been filled recently? Yes  Is the patient out of the medication? Yes  Has the patient been seen for an appointment in the last year OR does the patient have an upcoming appointment? Yes  Can we respond through MyChart? Yes  Agent: Please be advised that Rx refills may take up to 3 business days. We ask that you follow-up with your pharmacy.

## 2023-08-22 NOTE — Telephone Encounter (Signed)
 Requested medication (s) are due for refill today: yes  Requested medication (s) are on the active medication list: yes  Last refill:  07/22/23  Future visit scheduledyes   Notes to clinic:  Unable to refill per protocol, cannot delegate.      Requested Prescriptions  Pending Prescriptions Disp Refills   amphetamine -dextroamphetamine  (ADDERALL) 30 MG tablet 60 tablet 0    Sig: Take 1 tablet by mouth 2 (two) times daily.     Not Delegated - Psychiatry:  Stimulants/ADHD Failed - 08/22/2023  2:53 PM      Failed - This refill cannot be delegated      Failed - Urine Drug Screen completed in last 360 days      Passed - Last BP in normal range    BP Readings from Last 1 Encounters:  12/07/21 112/63         Passed - Last Heart Rate in normal range    Pulse Readings from Last 1 Encounters:  12/07/21 68         Passed - Valid encounter within last 6 months    Recent Outpatient Visits           2 months ago Other sinusitis, unspecified chronicity   [facility] [place], Janna, PA-C               oxyCODONE -acetaminophen  (ENDOCET) 7.5-325 MG tablet 60 tablet 0    Sig: Take 1 tablet by mouth 2 (two) times daily.     Not Delegated - Analgesics:  Opioid Agonist Combinations Failed - 08/22/2023  2:53 PM      Failed - This refill cannot be delegated      Failed - Urine Drug Screen completed in last 360 days      Passed - Valid encounter within last 3 months    Recent Outpatient Visits           2 months ago Other sinusitis, unspecified chronicity   [place] [place], Janna, PA-C

## 2023-08-26 MED ORDER — OXYCODONE-ACETAMINOPHEN 7.5-325 MG PO TABS: 1.0000 | ORAL_TABLET | Freq: Two times a day (BID) | ORAL | 0 refills | Status: DC

## 2023-08-26 MED ORDER — AMPHETAMINE-DEXTROAMPHETAMINE 30 MG PO TABS: 1.0000 | ORAL_TABLET | Freq: Two times a day (BID) | ORAL | 0 refills | Status: DC

## 2023-08-26 NOTE — Telephone Encounter (Signed)
 Please, let pt know That it is a courtesy refill. Needs to schedule an appointment in person before the next refill. Please, fax to her pharmacy

## 2023-08-28 NOTE — Telephone Encounter (Signed)
 Patient calling--[facility] [place].pharmacy faxed refill request for the following medications: was never confirmed by pharmacy   Amphetamine -Dextroamphetamine  1 tablet Oral 2 times daily  Protocol Details  oxyCODONE -Acetaminophen  1 tablet Oral 2 times daily Please advise

## 2023-08-28 NOTE — Telephone Encounter (Signed)
 The patient called inquiring about her refills. I told her about the courtesy refill until her next appt but that is Aug 1st as that was first available. She is hoping it will be enough to get her until then. Please assist patient further

## 2023-09-04 ENCOUNTER — Other Ambulatory Visit: Payer: Self-pay

## 2023-09-04 DIAGNOSIS — F900 Attention-deficit hyperactivity disorder, predominantly inattentive type: Secondary | ICD-10-CM

## 2023-09-04 DIAGNOSIS — R609 Edema, unspecified: Secondary | ICD-10-CM

## 2023-09-04 MED ORDER — OXYCODONE-ACETAMINOPHEN 7.5-325 MG PO TABS: 1.0000 | ORAL_TABLET | Freq: Two times a day (BID) | ORAL | 0 refills | Status: DC

## 2023-09-04 MED ORDER — AMPHETAMINE-DEXTROAMPHETAMINE 30 MG PO TABS: 1.0000 | ORAL_TABLET | Freq: Two times a day (BID) | ORAL | 0 refills | Status: DC

## 2023-09-04 NOTE — Telephone Encounter (Unsigned)
 Copied from CRM [id number]. Topic: Clinical - Prescription Issue >> Sep 04, 2023  2:14 PM Avram MATSU wrote: Reason for CRM: amphetamine -dextroamphetamine  (ADDERALL) 30 MG tablet [[id number]]  oxyCODONE -acetaminophen  (ENDOCET) 7.5-325 MG tablet [[id number]] patient is calling about the medication above and I informed her about the courtesy refill and stated it was sent on 6/17. She told me her pharmacy has not got the request for this refill. I also informed her the next refill is not due until she has her appt in aug.

## 2023-09-05 ENCOUNTER — Other Ambulatory Visit: Payer: Self-pay

## 2023-09-05 DIAGNOSIS — G8929 Other chronic pain: Secondary | ICD-10-CM

## 2023-09-05 DIAGNOSIS — F900 Attention-deficit hyperactivity disorder, predominantly inattentive type: Secondary | ICD-10-CM

## 2023-09-05 NOTE — Telephone Encounter (Signed)
 Converted to refill request   Copied from CRM [id number]. Topic: Clinical - Prescription Issue >> Sep 04, 2023  5:11 PM Delon T wrote: Reason for CRM: oxyCODONE -acetaminophen  (ENDOCET) 7.5-325 MG tablet amphetamine -dextroamphetamine  (ADDERALL) 30 MG tablet The refills were faxed to [facility] pharmacy and they are not able to fill them that way, they need electronic or paper prescription

## 2023-09-05 NOTE — Telephone Encounter (Signed)
 Message stated that the Rx's were faxed and they can not accept them that way, needs to be electronic or hard copy.

## 2023-09-08 ENCOUNTER — Telehealth: Payer: Self-pay | Admitting: Physician Assistant

## 2023-09-08 DIAGNOSIS — G8929 Other chronic pain: Secondary | ICD-10-CM

## 2023-09-08 MED ORDER — AMPHETAMINE-DEXTROAMPHETAMINE 30 MG PO TABS: 1.0000 | ORAL_TABLET | Freq: Two times a day (BID) | ORAL | 0 refills | Status: DC

## 2023-09-08 MED ORDER — OXYCODONE-ACETAMINOPHEN 7.5-325 MG PO TABS: 1.0000 | ORAL_TABLET | Freq: Two times a day (BID) | ORAL | 0 refills | Status: DC

## 2023-09-08 NOTE — Telephone Encounter (Signed)
 I made three attempts of sending these prescriptions online. Please let pt know that she needs to drop by to receive printer /paper prescriptions. Again these are courtesy refills, we need to discuss the needs for prescriptions at the follow-up .

## 2023-09-11 NOTE — Telephone Encounter (Signed)
 Copied from CRM [id number]. Topic: Clinical - Prescription Issue >> Sep 11, 2023 12:15 PM Ivette P wrote: Reason for CRM: PT called in because she is wanting a refill for her medication.   Shows courtesy refill was sent on 09/08/2023. Pt made an appt for 10/10/2023.  Pt called pharmacy at [facility] in [place] and they state they do not have the medication.   oxyCODONE -acetaminophen  (ENDOCET) 7.5-325 MG tablet    Pls follow up with pt and pharmacy to confirm. >> Sep 11, 2023 12:36 PM Avram MATSU wrote: please send medication to this pharmacy [facility] [address] [facility] [phone]   Please give patient a callback

## 2023-09-15 ENCOUNTER — Telehealth: Payer: Self-pay

## 2023-09-15 DIAGNOSIS — G8929 Other chronic pain: Secondary | ICD-10-CM

## 2023-09-15 MED ORDER — OXYCODONE-ACETAMINOPHEN 7.5-325 MG PO TABS: 1.0000 | ORAL_TABLET | Freq: Two times a day (BID) | ORAL | 0 refills | Status: DC

## 2023-09-15 NOTE — Telephone Encounter (Signed)
 Copied from CRM [id number]. Topic: Clinical - Prescription Issue >> Sep 11, 2023 12:15 PM Ivette P wrote: Reason for CRM: PT called in because she is wanting a refill for her medication.   Shows courtesy refill was sent on 09/08/2023. Pt made an appt for 10/10/2023.  Pt called pharmacy at [facility] in [place] and they state they do not have the medication.   oxyCODONE -acetaminophen  (ENDOCET) 7.5-325 MG tablet    Pls follow up with pt and pharmacy to confirm. >> Sep 15, 2023 11:28 AM Emylou G wrote: Patient called.. Adv needs to pick up script..  >> Sep 11, 2023 12:36 PM Avram MATSU wrote: please send medication to this pharmacy [facility] [address] [facility] [phone]   Please give patient a callback

## 2023-09-15 NOTE — Telephone Encounter (Signed)
 LVMTCB. Ok to advise upon call being returned.

## 2023-09-16 ENCOUNTER — Other Ambulatory Visit: Payer: Self-pay | Admitting: Physician Assistant

## 2023-09-16 ENCOUNTER — Other Ambulatory Visit: Payer: Self-pay

## 2023-09-16 DIAGNOSIS — G8929 Other chronic pain: Secondary | ICD-10-CM

## 2023-09-16 DIAGNOSIS — F900 Attention-deficit hyperactivity disorder, predominantly inattentive type: Secondary | ICD-10-CM

## 2023-09-16 MED ORDER — OXYCODONE-ACETAMINOPHEN 7.5-325 MG PO TABS: 1.0000 | ORAL_TABLET | Freq: Two times a day (BID) | ORAL | 0 refills | Status: DC

## 2023-09-16 MED ORDER — AMPHETAMINE-DEXTROAMPHETAMINE 30 MG PO TABS: 1.0000 | ORAL_TABLET | Freq: Two times a day (BID) | ORAL | 0 refills | Status: DC

## 2023-10-10 ENCOUNTER — Ambulatory Visit: Admitting: Physician Assistant

## 2023-10-17 ENCOUNTER — Other Ambulatory Visit: Payer: Self-pay | Admitting: Physician Assistant

## 2023-10-17 DIAGNOSIS — G8929 Other chronic pain: Secondary | ICD-10-CM

## 2023-10-17 DIAGNOSIS — F900 Attention-deficit hyperactivity disorder, predominantly inattentive type: Secondary | ICD-10-CM

## 2023-10-17 NOTE — Telephone Encounter (Unsigned)
 Copied from CRM #[id number]. Topic: Clinical - Medication Refill >> Oct 17, 2023  4:25 PM Elle L wrote: Medication: oxyCODONE -acetaminophen  (ENDOCET) 7.5-325 MG tablet AND amphetamine -dextroamphetamine  (ADDERALL) 30 MG tablet   Has the patient contacted their pharmacy? Yes  This is the patient's preferred pharmacy:  [facility] [address], [place] - [address] [place] [address] Phone: [phone] Fax: [phone]  Is this the correct pharmacy for this prescription? Yes  Has the prescription been filled recently? Yes  Is the patient out of the medication? No  Has the patient been seen for an appointment in the last year OR does the patient have an upcoming appointment? Yes  Can we respond through MyChart? No  Agent: Please be advised that Rx refills may take up to 3 business days. We ask that you follow-up with your pharmacy.

## 2023-10-20 ENCOUNTER — Encounter: Admitting: Family Medicine

## 2023-10-21 NOTE — Telephone Encounter (Signed)
 Requested medication (s) are due for refill today - yes  Requested medication (s) are on the active medication list -yes  Future visit scheduled -yes  Last refill: Adderall- 09/16/23 #60                  Endocet- 09/16/23 #60  Notes to clinic: non delegated Rx  Requested Prescriptions  Pending Prescriptions Disp Refills   amphetamine -dextroamphetamine  (ADDERALL) 30 MG tablet 60 tablet 0    Sig: Take 1 tablet by mouth 2 (two) times daily.     Not Delegated - Psychiatry:  Stimulants/ADHD Failed - 10/21/2023  2:54 PM      Failed - This refill cannot be delegated      Failed - Urine Drug Screen completed in last 360 days      Passed - Last BP in normal range    BP Readings from Last 1 Encounters:  12/07/21 112/63         Passed - Last Heart Rate in normal range    Pulse Readings from Last 1 Encounters:  12/07/21 68         Passed - Valid encounter within last 6 months    Recent Outpatient Visits           4 months ago Other sinusitis, unspecified chronicity   [facility], Janna, PA-C               oxyCODONE -acetaminophen  (ENDOCET) 7.5-325 MG tablet 60 tablet 0    Sig: Take 1 tablet by mouth 2 (two) times daily. Take 1 tablet by mouth 2 (two) times daily as needed.     Not Delegated - Analgesics:  Opioid Agonist Combinations Failed - 10/21/2023  2:54 PM      Failed - This refill cannot be delegated      Failed - Urine Drug Screen completed in last 360 days      Failed - Valid encounter within last 3 months    Recent Outpatient Visits           4 months ago Other sinusitis, unspecified chronicity   [facility] Levasy, [place], PA-C                 Requested Prescriptions  Pending Prescriptions Disp Refills   amphetamine -dextroamphetamine  (ADDERALL) 30 MG tablet 60 tablet 0    Sig: Take 1 tablet by mouth 2 (two) times daily.     Not Delegated - Psychiatry:  Stimulants/ADHD Failed - 10/21/2023   2:54 PM      Failed - This refill cannot be delegated      Failed - Urine Drug Screen completed in last 360 days      Passed - Last BP in normal range    BP Readings from Last 1 Encounters:  12/07/21 112/63         Passed - Last Heart Rate in normal range    Pulse Readings from Last 1 Encounters:  12/07/21 68         Passed - Valid encounter within last 6 months    Recent Outpatient Visits           4 months ago Other sinusitis, unspecified chronicity    [facility] [place], Janna, PA-C               oxyCODONE -acetaminophen  (ENDOCET) 7.5-325 MG tablet 60 tablet 0    Sig: Take 1 tablet by mouth 2 (two) times daily. Take 1 tablet  by mouth 2 (two) times daily as needed.     Not Delegated - Analgesics:  Opioid Agonist Combinations Failed - 10/21/2023  2:54 PM      Failed - This refill cannot be delegated      Failed - Urine Drug Screen completed in last 360 days      Failed - Valid encounter within last 3 months    Recent Outpatient Visits           4 months ago Other sinusitis, unspecified chronicity   [facility] [place], Janna, PA-C

## 2023-10-22 MED ORDER — AMPHETAMINE-DEXTROAMPHETAMINE 30 MG PO TABS: 1.0000 | ORAL_TABLET | Freq: Two times a day (BID) | ORAL | 0 refills | Status: DC

## 2023-10-22 MED ORDER — OXYCODONE-ACETAMINOPHEN 7.5-325 MG PO TABS: 1.0000 | ORAL_TABLET | Freq: Two times a day (BID) | ORAL | 0 refills | Status: DC

## 2023-10-22 NOTE — Telephone Encounter (Signed)
 LVM for patient to call back to let her know that she can come by the office anytime this afternoon to pick her paper prescriptions.  OK for E2C2 to relay message also let her know that this is only a 30 day courtesy refill and would need an appointment for further refills.

## 2023-10-27 ENCOUNTER — Other Ambulatory Visit: Payer: Self-pay | Admitting: Physician Assistant

## 2023-10-27 ENCOUNTER — Other Ambulatory Visit: Payer: Self-pay

## 2023-10-27 ENCOUNTER — Telehealth: Payer: Self-pay

## 2023-10-27 DIAGNOSIS — G8929 Other chronic pain: Secondary | ICD-10-CM

## 2023-10-27 DIAGNOSIS — F900 Attention-deficit hyperactivity disorder, predominantly inattentive type: Secondary | ICD-10-CM

## 2023-10-27 MED ORDER — OXYCODONE-ACETAMINOPHEN 7.5-325 MG PO TABS: 1.0000 | ORAL_TABLET | Freq: Two times a day (BID) | ORAL | 0 refills | Status: DC

## 2023-10-27 MED ORDER — AMPHETAMINE-DEXTROAMPHETAMINE 30 MG PO TABS
1.0000 | ORAL_TABLET | Freq: Two times a day (BID) | ORAL | 0 refills | Status: DC
Start: 2023-10-27 — End: 2023-12-01

## 2023-10-27 NOTE — Telephone Encounter (Signed)
 Copied from CRM #[id number]. Topic: Clinical - Medication Question >> Oct 27, 2023 11:18 AM Myrick T wrote: Reason for CRM: patient called stated she is driving an hour and 15 mins to come by the office to pick up a script for amphetamine -dextroamphetamine  (ADDERALL) 30 MG tablet and oxyCODONE -acetaminophen  (ENDOCET) 7.5-325 MG tablet and wanted to see if it could be ready when she get there. She stated she will be there at 1pm

## 2023-10-27 NOTE — Progress Notes (Signed)
 Opened in error

## 2023-10-27 NOTE — Telephone Encounter (Signed)
 Done and both have been picked up from patient

## 2023-12-01 ENCOUNTER — Encounter: Payer: Self-pay | Admitting: Family Medicine

## 2023-12-01 ENCOUNTER — Ambulatory Visit (INDEPENDENT_AMBULATORY_CARE_PROVIDER_SITE_OTHER): Admitting: Family Medicine

## 2023-12-01 VITALS — BP 130/49 | HR 68 | Temp 97.9°F | Ht 61.0 in | Wt 203.7 lb

## 2023-12-01 DIAGNOSIS — H04129 Dry eye syndrome of unspecified lacrimal gland: Secondary | ICD-10-CM

## 2023-12-01 DIAGNOSIS — M542 Cervicalgia: Secondary | ICD-10-CM | POA: Diagnosis not present

## 2023-12-01 DIAGNOSIS — R5382 Chronic fatigue, unspecified: Secondary | ICD-10-CM

## 2023-12-01 DIAGNOSIS — Z789 Other specified health status: Secondary | ICD-10-CM

## 2023-12-01 DIAGNOSIS — M4155 Other secondary scoliosis, thoracolumbar region: Secondary | ICD-10-CM

## 2023-12-01 DIAGNOSIS — E034 Atrophy of thyroid (acquired): Secondary | ICD-10-CM | POA: Diagnosis not present

## 2023-12-01 DIAGNOSIS — N182 Chronic kidney disease, stage 2 (mild): Secondary | ICD-10-CM

## 2023-12-01 DIAGNOSIS — K117 Disturbances of salivary secretion: Secondary | ICD-10-CM

## 2023-12-01 DIAGNOSIS — Z79899 Other long term (current) drug therapy: Secondary | ICD-10-CM

## 2023-12-01 DIAGNOSIS — J302 Other seasonal allergic rhinitis: Secondary | ICD-10-CM

## 2023-12-01 DIAGNOSIS — Z130 Encounter for screening for diseases of the blood and blood-forming organs and certain disorders involving the immune mechanism: Secondary | ICD-10-CM

## 2023-12-01 DIAGNOSIS — K5909 Other constipation: Secondary | ICD-10-CM | POA: Diagnosis not present

## 2023-12-01 DIAGNOSIS — E782 Mixed hyperlipidemia: Secondary | ICD-10-CM | POA: Diagnosis not present

## 2023-12-01 DIAGNOSIS — J441 Chronic obstructive pulmonary disease with (acute) exacerbation: Secondary | ICD-10-CM

## 2023-12-01 DIAGNOSIS — R519 Headache, unspecified: Secondary | ICD-10-CM

## 2023-12-01 DIAGNOSIS — K219 Gastro-esophageal reflux disease without esophagitis: Secondary | ICD-10-CM

## 2023-12-01 DIAGNOSIS — J3089 Other allergic rhinitis: Secondary | ICD-10-CM | POA: Diagnosis not present

## 2023-12-01 DIAGNOSIS — R0683 Snoring: Secondary | ICD-10-CM

## 2023-12-01 DIAGNOSIS — F1190 Opioid use, unspecified, uncomplicated: Secondary | ICD-10-CM | POA: Diagnosis not present

## 2023-12-01 DIAGNOSIS — E559 Vitamin D deficiency, unspecified: Secondary | ICD-10-CM

## 2023-12-01 DIAGNOSIS — F900 Attention-deficit hyperactivity disorder, predominantly inattentive type: Secondary | ICD-10-CM

## 2023-12-01 DIAGNOSIS — G8929 Other chronic pain: Secondary | ICD-10-CM

## 2023-12-01 DIAGNOSIS — R7303 Prediabetes: Secondary | ICD-10-CM

## 2023-12-01 MED ORDER — LANCET DEVICE MISC: 1.0000 | Freq: Every day | 0 refills | Status: AC

## 2023-12-01 MED ORDER — AMPHETAMINE-DEXTROAMPHETAMINE 30 MG PO TABS: 1.0000 | ORAL_TABLET | Freq: Two times a day (BID) | ORAL | 0 refills | Status: DC

## 2023-12-01 MED ORDER — LUBIPROSTONE 24 MCG PO CAPS: 24.0000 ug | ORAL_CAPSULE | Freq: Two times a day (BID) | ORAL | 3 refills | Status: DC

## 2023-12-01 MED ORDER — IPRATROPIUM BROMIDE 0.03 % NA SOLN: 2.0000 | Freq: Two times a day (BID) | NASAL | 12 refills | Status: AC

## 2023-12-01 MED ORDER — ALBUTEROL SULFATE HFA 108 (90 BASE) MCG/ACT IN AERS: INHALATION_SPRAY | RESPIRATORY_TRACT | 0 refills | Status: DC

## 2023-12-01 MED ORDER — LEVOCETIRIZINE DIHYDROCHLORIDE 5 MG PO TABS: 5.0000 mg | ORAL_TABLET | Freq: Every evening | ORAL | 1 refills | Status: AC

## 2023-12-01 MED ORDER — BLOOD GLUCOSE MONITORING SUPPL DEVI: 1.0000 | Freq: Every day | 0 refills | Status: AC

## 2023-12-01 MED ORDER — PANTOPRAZOLE SODIUM 40 MG PO TBEC: 40.0000 mg | DELAYED_RELEASE_TABLET | Freq: Every day | ORAL | 0 refills | Status: DC

## 2023-12-01 MED ORDER — OXYCODONE-ACETAMINOPHEN 7.5-325 MG PO TABS: 1.0000 | ORAL_TABLET | Freq: Two times a day (BID) | ORAL | 0 refills | Status: DC

## 2023-12-01 MED ORDER — LANCETS MISC. MISC: 1.0000 | Freq: Every day | 3 refills | Status: AC

## 2023-12-01 MED ORDER — BLOOD GLUCOSE TEST VI STRP: 1.0000 | ORAL_STRIP | Freq: Every day | 3 refills | Status: AC

## 2023-12-01 MED ORDER — PANTOPRAZOLE SODIUM 40 MG PO TBEC: 40.0000 mg | DELAYED_RELEASE_TABLET | Freq: Every day | ORAL | 0 refills | Status: AC

## 2023-12-01 NOTE — Assessment & Plan Note (Signed)
 Take oxycodone /acetaminophen  to address. Dosage - 7.5/325 mg bid prn
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
## 2024-01-02 ENCOUNTER — Other Ambulatory Visit: Payer: Self-pay | Admitting: Family Medicine

## 2024-01-02 DIAGNOSIS — G8929 Other chronic pain: Secondary | ICD-10-CM

## 2024-01-02 DIAGNOSIS — F900 Attention-deficit hyperactivity disorder, predominantly inattentive type: Secondary | ICD-10-CM

## 2024-01-02 NOTE — Telephone Encounter (Signed)
 Copied from CRM [id number]. Topic: Clinical - Medication Refill >> Jan 02, 2024  4:10 PM Yolanda T wrote: Medication: oxyCODONE -acetaminophen  (ENDOCET) 7.5-325 MG tablet and amphetamine -dextroamphetamine  (ADDERALL) 30 MG tablet  Has the patient contacted their pharmacy? Yes  This is the patient's preferred pharmacy:  [facility] [address], [place] - [address] [place] [address] Phone: [phone] Fax: [phone]  Is this the correct pharmacy for this prescription? Yes  Has the prescription been filled recently? Yes  Is the patient out of the medication? Yes  Has the patient been seen for an appointment in the last year OR does the patient have an upcoming appointment? Yes  Can we respond through MyChart? Yes  Agent: Please be advised that Rx refills may take up to 3 business days. We ask that you follow-up with your pharmacy.

## 2024-01-05 MED ORDER — AMPHETAMINE-DEXTROAMPHETAMINE 30 MG PO TABS: 1.0000 | ORAL_TABLET | Freq: Two times a day (BID) | ORAL | 0 refills | Status: DC

## 2024-01-05 MED ORDER — OXYCODONE-ACETAMINOPHEN 7.5-325 MG PO TABS: 1.0000 | ORAL_TABLET | Freq: Two times a day (BID) | ORAL | 0 refills | Status: DC

## 2024-01-05 NOTE — Telephone Encounter (Signed)
 Requested medication (s) are due for refill today: yes  Requested medication (s) are on the active medication list: yes  Last refill:  12/01/23  Future visit scheduled: yes  Notes to clinic:  Unable to refill per protocol, cannot delegate.  Requested Prescriptions  Pending Prescriptions Disp Refills   amphetamine -dextroamphetamine  (ADDERALL) 30 MG tablet 60 tablet 0    Sig: Take 1 tablet by mouth 2 (two) times daily.     Not Delegated - Psychiatry:  Stimulants/ADHD Failed - 01/05/2024  1:51 PM      Failed - This refill cannot be delegated      Failed - Urine Drug Screen completed in last 360 days      Failed - Last BP in normal range    BP Readings from Last 1 Encounters:  12/01/23 (!) 130/49         Passed - Last Heart Rate in normal range    Pulse Readings from Last 1 Encounters:  12/01/23 68         Passed - Valid encounter within last 6 months    Recent Outpatient Visits           1 month ago Transition of care   [facility], Lauraine SAILOR, DO   6 months ago Other sinusitis, unspecified chronicity   [facility] [place], Janna, PA-C               oxyCODONE -acetaminophen  (ENDOCET) 7.5-325 MG tablet 60 tablet 0    Sig: Take 1 tablet by mouth 2 (two) times daily.     Not Delegated - Analgesics:  Opioid Agonist Combinations Failed - 01/05/2024  1:51 PM      Failed - This refill cannot be delegated      Failed - Urine Drug Screen completed in last 360 days      Passed - Valid encounter within last 3 months    Recent Outpatient Visits           1 month ago Transition of care   [facility] [place], Lauraine SAILOR, DO   6 months ago Other sinusitis, unspecified chronicity   [facility] [place], Janna, PA-C                   Requested Prescriptions  Pending Prescriptions Disp Refills   amphetamine -dextroamphetamine  (ADDERALL) 30 MG tablet 60 tablet  0    Sig: Take 1 tablet by mouth 2 (two) times daily.     Not Delegated - Psychiatry:  Stimulants/ADHD Failed - 01/05/2024  1:49 PM      Failed - This refill cannot be delegated      Failed - Urine Drug Screen completed in last 360 days      Failed - Last BP in normal range    BP Readings from Last 1 Encounters:  12/01/23 (!) 130/49         Passed - Last Heart Rate in normal range    Pulse Readings from Last 1 Encounters:  12/01/23 68         Passed - Valid encounter within last 6 months    Recent Outpatient Visits           1 month ago Transition of care   [facility] [place], Lauraine SAILOR, DO   6 months ago Other sinusitis, unspecified chronicity   [facility] [place], Janna, PA-C  oxyCODONE -acetaminophen  (ENDOCET) 7.5-325 MG tablet 60 tablet 0    Sig: Take 1 tablet by mouth 2 (two) times daily.     Not Delegated - Analgesics:  Opioid Agonist Combinations Failed - 01/05/2024  1:49 PM      Failed - This refill cannot be delegated      Failed - Urine Drug Screen completed in last 360 days      Passed - Valid encounter within last 3 months    Recent Outpatient Visits           1 month ago Transition of care   [facility], Lauraine SAILOR, DO   6 months ago Other sinusitis, unspecified chronicity   [place] [facility] [place], Janna, PA-C

## 2024-01-05 NOTE — Telephone Encounter (Signed)
 LOV- 12/01/2023 NOV- 01/12/2024 LRF- 12/01/2023 for both prescriptions    Courtesy refill has already been sent, stated need an appointment for further refills.  Has one on 01/12/2024 with provider.

## 2024-01-05 NOTE — Telephone Encounter (Signed)
 Patient has an appointment on 01/11/2024 to be able to get further refills.

## 2024-01-12 ENCOUNTER — Ambulatory Visit: Admitting: Family Medicine

## 2024-01-30 ENCOUNTER — Ambulatory Visit: Admitting: Family Medicine

## 2024-02-03 ENCOUNTER — Other Ambulatory Visit: Payer: Self-pay | Admitting: Family Medicine

## 2024-02-03 ENCOUNTER — Telehealth: Payer: Self-pay | Admitting: Family Medicine

## 2024-02-03 DIAGNOSIS — G8929 Other chronic pain: Secondary | ICD-10-CM

## 2024-02-03 DIAGNOSIS — F900 Attention-deficit hyperactivity disorder, predominantly inattentive type: Secondary | ICD-10-CM

## 2024-02-03 NOTE — Telephone Encounter (Signed)
 Already sent in another encounter  Copied from CRM [id number]. Topic: Clinical - Medication Refill >> Jan 02, 2024  4:10 PM Yolanda T wrote: Medication: oxyCODONE -acetaminophen  (ENDOCET) 7.5-325 MG tablet and amphetamine -dextroamphetamine  (ADDERALL) 30 MG tablet  Has the patient contacted their pharmacy? Yes  This is the patient's preferred pharmacy:  [facility] [address] [place] - [address] [place] [address] Phone: [phone] Fax: [phone]  Is this the correct pharmacy for this prescription? Yes  Has the prescription been filled recently? Yes  Is the patient out of the medication? Yes  Has the patient been seen for an appointment in the last year OR does the patient have an upcoming appointment? Yes  Can we respond through MyChart? Yes  Agent: Please be advised that Rx refills may take up to 3 business days. We ask that you follow-up with your pharmacy.

## 2024-02-03 NOTE — Telephone Encounter (Signed)
 Copied from CRM #[id number]. Topic: Clinical - Medication Refill >> Feb 03, 2024  2:10 PM Teressa P wrote: Medication:  Adderall 30 mg Generic Endocet 7.5/325  Has the patient contacted their pharmacy? No (Agent: If no, request that the patient contact the pharmacy for the refill. If patient does not wish to contact the pharmacy document the reason why and proceed with request.) (Agent: If yes, when and what did the pharmacy advise?)  This is the patient's preferred pharmacy:  [facility] [address] [place] - [address] [place] [address] Phone: [phone] Fax: [phone]   Is this the correct pharmacy for this prescription? Yes If no, delete pharmacy and type the correct one.   Has the prescription been filled recently? Yes  Is the patient out of the medication? No  Has the patient been seen for an appointment in the last year OR does the patient have an upcoming appointment? Yes  Can we respond through MyChart? No  Agent: Please be advised that Rx refills may take up to 3 business days. We ask that you follow-up with your pharmacy.

## 2024-02-06 NOTE — Telephone Encounter (Signed)
 Requested medication (s) are due for refill today: yes  Requested medication (s) are on the active medication list: yes  Last refill:  01/05/24  Future visit scheduled: yes  Notes to clinic:  Unable to refill per protocol, cannot delegate.      Requested Prescriptions  Pending Prescriptions Disp Refills   amphetamine -dextroamphetamine  (ADDERALL) 30 MG tablet 60 tablet 0    Sig: Take 1 tablet by mouth 2 (two) times daily.     Not Delegated - Psychiatry:  Stimulants/ADHD Failed - 02/06/2024 11:34 AM      Failed - This refill cannot be delegated      Failed - Urine Drug Screen completed in last 360 days      Failed - Last BP in normal range    BP Readings from Last 1 Encounters:  12/01/23 (!) 130/49         Passed - Last Heart Rate in normal range    Pulse Readings from Last 1 Encounters:  12/01/23 68         Passed - Valid encounter within last 6 months    Recent Outpatient Visits           2 months ago Transition of care   [facility] [place], Lauraine SAILOR, DO   8 months ago Other sinusitis, unspecified chronicity   [place] [facility] [place], Janna, PA-C               oxyCODONE -acetaminophen  (ENDOCET) 7.5-325 MG tablet 60 tablet 0    Sig: Take 1 tablet by mouth 2 (two) times daily.     Not Delegated - Analgesics:  Opioid Agonist Combinations Failed - 02/06/2024 11:34 AM      Failed - This refill cannot be delegated      Failed - Urine Drug Screen completed in last 360 days      Passed - Valid encounter within last 3 months    Recent Outpatient Visits           2 months ago Transition of care   [facility] [place], Lauraine SAILOR, DO   8 months ago Other sinusitis, unspecified chronicity   [place] [place], Janna, PA-C

## 2024-02-09 NOTE — Telephone Encounter (Signed)
 LOV- 12/01/2023 NOV- 03/17/2024 LRF- 01/05/2024 Outpatient Morphine Milligram Equivalents Per Day  01/05/24 and after  22.5 MME/day Order Name Dose Route Frequency Maximum MME/Day   oxyCODONE -acetaminophen  (ENDOCET) 7.5-325 MG tablet 1 tablet Oral 2 times daily 22.5 MME/day    01/05/2024 Outpatient Medication Detail   Disp Refills Start End   amphetamine -dextroamphetamine  (ADDERALL) 30 MG tablet 60 tablet 0 01/05/2024 --   Sig - Route: Take 1 tablet by mouth 2 (two) times daily. - Oral   Sent to pharmacy as: amphetamine -dextroamphetamine  (ADDERALL) 30 MG tablet   Earliest Fill Date: 01/05/2024   E-Prescribing Status: Receipt confirmed by pharmacy (01/05/2024  3:58 PM EDT)

## 2024-02-10 ENCOUNTER — Other Ambulatory Visit: Payer: Self-pay

## 2024-02-10 DIAGNOSIS — F900 Attention-deficit hyperactivity disorder, predominantly inattentive type: Secondary | ICD-10-CM

## 2024-02-10 DIAGNOSIS — G8929 Other chronic pain: Secondary | ICD-10-CM

## 2024-02-10 MED ORDER — OXYCODONE-ACETAMINOPHEN 7.5-325 MG PO TABS: 1.0000 | ORAL_TABLET | Freq: Two times a day (BID) | ORAL | 0 refills | Status: DC

## 2024-02-10 MED ORDER — AMPHETAMINE-DEXTROAMPHETAMINE 30 MG PO TABS: 1.0000 | ORAL_TABLET | Freq: Two times a day (BID) | ORAL | 0 refills | Status: DC

## 2024-02-10 NOTE — Telephone Encounter (Signed)
 Please advise in the provider's absence.   Copied from CRM [id number]. Topic: Clinical - Medication Question >> Feb 10, 2024 12:41 PM Selinda RAMAN wrote: Reason for CRM: The patient called in stating she cannot make it in until January because she does not drive and the person who brings her does not have time off for December that they can take. Both medication refill request she asked for were denied stating she needs an appointment. She is hoping that she can at least get a temporary fill of both medications, amphetamine -dextroamphetamine  (ADDERALL) 30 MG tablet and oxyCODONE -acetaminophen  (ENDOCET) 7.5-325 MG tablet to get her to that appointment in January. Please assist patient further.

## 2024-02-11 ENCOUNTER — Encounter: Payer: Self-pay | Admitting: *Deleted

## 2024-02-11 NOTE — Progress Notes (Signed)
 BANI GIANFRANCESCO                                          MRN: [id number]   02/11/2024   The [facility] Quality Team Specialist reviewed this patient medical record for the purposes of chart review for care gap closure. The following were reviewed: chart review for care gap closure-breast cancer screening and colorectal cancer screening.    [facility] Quality Team

## 2024-03-17 ENCOUNTER — Encounter: Payer: Self-pay | Admitting: Family Medicine

## 2024-03-17 ENCOUNTER — Ambulatory Visit: Admitting: Family Medicine

## 2024-03-17 VITALS — BP 123/76 | HR 68 | Resp 16 | Ht 61.0 in | Wt 205.1 lb

## 2024-03-17 DIAGNOSIS — F1190 Opioid use, unspecified, uncomplicated: Secondary | ICD-10-CM

## 2024-03-17 DIAGNOSIS — J441 Chronic obstructive pulmonary disease with (acute) exacerbation: Secondary | ICD-10-CM | POA: Diagnosis not present

## 2024-03-17 DIAGNOSIS — J0101 Acute recurrent maxillary sinusitis: Secondary | ICD-10-CM | POA: Diagnosis not present

## 2024-03-17 DIAGNOSIS — M542 Cervicalgia: Secondary | ICD-10-CM

## 2024-03-17 DIAGNOSIS — Z122 Encounter for screening for malignant neoplasm of respiratory organs: Secondary | ICD-10-CM | POA: Diagnosis not present

## 2024-03-17 DIAGNOSIS — Z1212 Encounter for screening for malignant neoplasm of rectum: Secondary | ICD-10-CM | POA: Diagnosis not present

## 2024-03-17 DIAGNOSIS — Z1211 Encounter for screening for malignant neoplasm of colon: Secondary | ICD-10-CM | POA: Diagnosis not present

## 2024-03-17 DIAGNOSIS — K5909 Other constipation: Secondary | ICD-10-CM

## 2024-03-17 DIAGNOSIS — Z1231 Encounter for screening mammogram for malignant neoplasm of breast: Secondary | ICD-10-CM

## 2024-03-17 DIAGNOSIS — F900 Attention-deficit hyperactivity disorder, predominantly inattentive type: Secondary | ICD-10-CM | POA: Diagnosis not present

## 2024-03-17 DIAGNOSIS — G8929 Other chronic pain: Secondary | ICD-10-CM | POA: Diagnosis not present

## 2024-03-17 DIAGNOSIS — E782 Mixed hyperlipidemia: Secondary | ICD-10-CM

## 2024-03-17 MED ORDER — AMPHETAMINE-DEXTROAMPHETAMINE 30 MG PO TABS: 1.0000 | ORAL_TABLET | Freq: Two times a day (BID) | ORAL | 0 refills | Status: AC

## 2024-03-17 MED ORDER — OXYCODONE-ACETAMINOPHEN 7.5-325 MG PO TABS: 1.0000 | ORAL_TABLET | Freq: Two times a day (BID) | ORAL | 0 refills | Status: AC

## 2024-03-17 MED ORDER — EZETIMIBE 10 MG PO TABS: 10.0000 mg | ORAL_TABLET | Freq: Every day | ORAL | 3 refills | Status: AC

## 2024-03-17 MED ORDER — AMOXICILLIN-POT CLAVULANATE 875-125 MG PO TABS: 1.0000 | ORAL_TABLET | Freq: Two times a day (BID) | ORAL | 0 refills | Status: AC

## 2024-03-17 MED ORDER — LUBIPROSTONE 8 MCG PO CAPS: 8.0000 ug | ORAL_CAPSULE | Freq: Two times a day (BID) | ORAL | 3 refills | Status: AC

## 2024-03-17 MED ORDER — ALBUTEROL SULFATE HFA 108 (90 BASE) MCG/ACT IN AERS: INHALATION_SPRAY | RESPIRATORY_TRACT | 3 refills | Status: AC

## 2024-03-17 NOTE — Progress Notes (Signed)
 "     Established patient visit   Patient: Kathleen Buchanan   DOB: 1965-04-09   59 y.o. Female  MRN: [id number] Visit Date: 03/17/2024  Today's healthcare provider: LAURAINE LOISE BUOY, DO   Chief Complaint  Patient presents with   Medical Management of Chronic Issues    Following up on ADD, COPD, chronic neck pain   Headache    X 3 weeks-sinus pressure, post nasal drip and feels fluid in ears   Subjective    Headache    Kathleen Buchanan is a 59 year old female who presents with sinus pressure, headache, and postnasal drip.  She experiences sinus pressure, headache, postnasal drip, and fluid in the ears. Despite using allergy medications like Zyrtec  and Flonase, her sinuses remain dry, exacerbating her symptoms. She suspects an infection when these medications are ineffective. She has a history of year-round allergies and suspects she might be allergic to her dogs. Allegra was tried in the past but did not alleviate all symptoms. A deviated septum was identified as a potential issue, but surgery has not been pursued.  She is currently taking Zyrtec  and nasal sprays, including ipratropium nasal spray obtained in September, but these have not resolved her symptoms. She experiences a lack of rest, occasional coughing, and a sore throat attributed to postnasal drip. Shortness of breath occurs sometimes when walking, and she uses an albuterol  inhaler as needed.  She experiences panic attacks, which [place]. An ankle injury causes swelling and numbness in her foot, further limiting her ability to drive.  She takes Adderall but skips doses when experiencing headaches. She is cautious about mixing medications and takes oxycodone  twice a day, only after eating to avoid stomach upset.  She has a history of loose stools occurring once a week and has tried lubiprostone , which caused diarrhea. She experiences a constant twitch in her left eye, attributed to a past  eye surgery for an upper eye lift. The twitching has been persistent and bothersome.  She has been prescribed ezetimibe  for cholesterol management but has not yet started taking it. She has avoided statins due to concerns about potential side effects.      Medications: Show/hide medication list[1]  Review of Systems  Neurological:  Positive for headaches.        Objective    BP 123/76 (BP Location: Left Arm, Patient Position: Sitting, Cuff Size: Large)   Pulse 68   Resp 16   Ht 5' 1 (1.549 m)   Wt 205 lb 1.6 oz (93 kg)   SpO2 98%   BMI 38.75 kg/m     Physical Exam Vitals reviewed.  Constitutional:      General: She is not in acute distress.    Appearance: Normal appearance. She is well-developed. She is not diaphoretic.  HENT:     Head: Normocephalic and atraumatic.     Right Ear: Tympanic membrane, ear canal and external ear normal.     Left Ear: Tympanic membrane, ear canal and external ear normal.     Nose: Congestion and rhinorrhea present.     Mouth/Throat:     Mouth: Mucous membranes are moist.     Pharynx: Oropharynx is clear. No oropharyngeal exudate.  Eyes:     General: No scleral icterus.    Conjunctiva/sclera: Conjunctivae normal.     Pupils: Pupils are equal, round, and reactive to light.  Cardiovascular:     Rate and Rhythm: Normal rate and regular rhythm.  Pulses: Normal pulses.     Heart sounds: Normal heart sounds. No murmur heard. Pulmonary:     Effort: Pulmonary effort is normal. No respiratory distress.     Breath sounds: Normal breath sounds. No wheezing or rales.  Musculoskeletal:     Cervical back: Neck supple.     Right lower leg: No edema.     Left lower leg: No edema.  Lymphadenopathy:     Cervical: No cervical adenopathy.  Skin:    General: Skin is warm and dry.     Findings: No rash.  Neurological:     Mental Status: She is alert.      No results found for any visits on 03/17/24.  Assessment & Plan    Acute  recurrent maxillary sinusitis -     Amoxicillin -Pot Clavulanate; Take 1 tablet by mouth 2 (two) times daily for 7 days.  Dispense: 14 tablet; Refill: 0  COPD, frequent exacerbations (HCC) -     Albuterol  Sulfate HFA; INHALE 2 PUFFS INTO THE LUNGS EVERY 4 HOURS AS NEEDED FOR WHEEZE OR FOR SHORTNESS OF BREATH  Dispense: 18 g; Refill: 3  Attention deficit hyperactivity disorder (ADHD), predominantly inattentive type -     Amphetamine -Dextroamphetamine ; Take 1 tablet by mouth 2 (two) times daily.  Dispense: 60 tablet; Refill: 0  Neck pain, chronic -     oxyCODONE -Acetaminophen ; Take 1 tablet by mouth 2 (two) times daily.  Dispense: 60 tablet; Refill: 0  Chronic, continuous use of opioids -     Lubiprostone ; Take 1 capsule (8 mcg total) by mouth 2 (two) times daily with a meal.  Dispense: 60 capsule; Refill: 3  Chronic constipation -     Lubiprostone ; Take 1 capsule (8 mcg total) by mouth 2 (two) times daily with a meal.  Dispense: 60 capsule; Refill: 3  Obesity, morbid (HCC)  Mixed hyperlipidemia -     Ezetimibe ; Take 1 tablet (10 mg total) by mouth daily.  Dispense: 90 tablet; Refill: 3  Encounter for screening mammogram for breast cancer -     3D Screening Mammogram, Left and Right; Future  Encounter for colorectal cancer screening -     IFOBT POC (occult bld, rslt in office); Future  Encounter for screening for lung cancer -     Ambulatory Referral for Lung Cancer Scre     Acute recurrent maxillary sinusitis Recurrent sinusitis with headache, sinus pressure, postnasal drip, and ear fluid. Allergy medications cause dryness. Deviated septum likely contributing factor but patient prefers to avoid surgery. Advised against concurrent Sudafed and Adderall use. - Prescribed Augmentin  875 mg twice daily. - Continue Zyrtec  and azelastine . - Consider Sudafed if not taking Adderall.  Chronic obstructive pulmonary disease with frequent exacerbations COPD with frequent exacerbations,  occasional wheezing, and shortness of breath. No regular maintenance inhaler use. Emphasized inhaler use during illness. - Use albuterol  inhaler more frequently during illness. - Provided albuterol  inhaler refills.  Attention-deficit hyperactivity disorder, predominantly inattentive type ADHD managed with Adderall. Occasionally skips doses due to headaches. Discussed interactions with Sudafed and oxycodone . - Continue Adderall as prescribed.  Neck pain, chronic; chronic, continuous use of opioids  Chronic oxycodone  use for pain. Skips doses if gastrointestinal upset occurs. Discussed interactions with Adderall and ibuprofen. - Continue oxycodone  as prescribed. - Discussed safe ibuprofen use with oxycodone .  Chronic constipation Managed with lubiprostone , previously caused diarrhea. Considered lower dose for symptom management. - Prescribed lower dose of lubiprostone .  Obesity, morbid Chronic, stable overall.  Current BMI 38.77.  Associated  with COPD and hyperlipidemia.  Counseled patient on lifestyle modifications.  Mixed hyperlipidemia Managed with ezetimibe . Not started yet. Discussed statins' benefits and reversibility of statin-induced dementia. Prefers to avoid statins. - Start ezetimibe  10 mg daily.  General health maintenance Discussed lung cancer screening and mammogram. Open to mammogram with transportation assistance. Declined COVID booster and other vaccines. Discussed Cologuard as colonoscopy alternative. - Schedule mammogram in [place]. - Consider Cologuard for colon cancer screening.    Return in about 3 months (around 06/28/2024) for TOC/CPE w/Dr. Franchot or Dr. Sharma.      I discussed the assessment and treatment plan with the patient  The patient was provided an opportunity to ask questions and all were answered. The patient agreed with the plan and demonstrated an understanding of the instructions.   The patient was advised to call back or seek an  in-person evaluation if the symptoms worsen or if the condition fails to improve as anticipated.    LAURAINE LOISE BUOY, DO  [facility] [phone] (phone) [phone] (fax)  [facility]     [1]  Outpatient Medications Prior to Visit  Medication Sig Note   Aspirin-Caffeine (BC FAST PAIN RELIEF PO) Take by mouth as needed.    Blood Glucose Monitoring Suppl DEVI 1 each by Does not apply route daily before breakfast. May substitute to any manufacturer covered by patient's insurance.    Glucose Blood (BLOOD GLUCOSE TEST STRIPS) STRP 1 each by In Vitro route daily before breakfast. May substitute to any manufacturer covered by patient's insurance.    ibuprofen (ADVIL,MOTRIN) 200 MG tablet Take 4 tablets by mouth as needed.    ipratropium (ATROVENT ) 0.03 % nasal spray Place 2 sprays into both nostrils every 12 (twelve) hours.    Lancet Device MISC 1 each by Does not apply route daily before breakfast. May substitute to any manufacturer covered by patient's insurance.    Lancets (ONETOUCH ULTRASOFT) lancets To check blood sugar daily for diabetes    levocetirizine (XYZAL ) 5 MG tablet Take 1 tablet (5 mg total) by mouth every evening.    pantoprazole  (PROTONIX ) 40 MG tablet Take 1 tablet (40 mg total) by mouth daily.    Vitamin D , Ergocalciferol , (DRISDOL ) 1.25 MG (50000 UNIT) CAPS capsule Take 1 capsule (50,000 Units total) by mouth every 7 (seven) days.    [DISCONTINUED] albuterol  (VENTOLIN  HFA) 108 (90 Base) MCG/ACT inhaler INHALE 2 PUFFS INTO THE LUNGS EVERY 4 HOURS AS NEEDED FOR WHEEZE OR FOR SHORTNESS OF BREATH    [DISCONTINUED] amphetamine -dextroamphetamine  (ADDERALL) 30 MG tablet Take 1 tablet by mouth 2 (two) times daily.    [DISCONTINUED] ezetimibe  (ZETIA ) 10 MG tablet Take 1 tablet (10 mg total) by mouth daily.    [DISCONTINUED] lubiprostone  (AMITIZA ) 24 MCG capsule Take 1 capsule (24 mcg total) by mouth 2 (two) times daily with a meal. 03/17/2024:  diarrhea w/higher dose   [DISCONTINUED] oxyCODONE -acetaminophen  (ENDOCET) 7.5-325 MG tablet Take 1 tablet by mouth 2 (two) times daily.    No facility-administered medications prior to visit.   "

## 2024-03-17 NOTE — Patient Instructions (Addendum)
 For Mammogram, contact:  [facility] - [facility], [place], [place] [address] Phone: [phone]

## 2024-03-26 LAB — ANA 12PLUS PROFILE, DO ALL RDL
Anti-CCP Ab, IgG & IgA (RDL): <20 U
Anti-Cardiolipin Ab, IgA (RDL): <12 U/mL
Anti-Cardiolipin Ab, IgG (RDL): <15
Anti-Cardiolipin Ab, IgM (RDL): <13 [MPL'U]/mL
Anti-Centromere Ab (RDL): <1:40 {titer}
Anti-Chromatin Ab, IgG (RDL): <20 U
Anti-La (SS-B) Ab (RDL): <20 U
Anti-Nuclear Ab by IFA (RDL): POSITIVE — AB
Anti-Ro (SS-A) Ab (RDL): <20 U
Anti-Scl-70 Ab (RDL): <20 U
Anti-Sm Ab (RDL): <20 U
Anti-TPO Ab (RDL): 9613.1 [IU]/mL — AB
Anti-U1 RNP Ab (RDL): <20 U
Anti-dsDNA Ab by Farr(RDL): <8 [IU]/mL
C3 Complement (RDL): 167 mg/dL (ref 90–180)
C4 Complement (RDL): 28 mg/dL (ref 10–40)
Rheumatoid Factor by Turb RDL: <14 [IU]/mL

## 2024-03-26 LAB — HEMOGLOBIN A1C
Est. average glucose Bld gHb Est-mCnc: 114 mg/dL
Hgb A1c MFr Bld: 5.6 % (ref 4.8–5.6)

## 2024-03-26 LAB — COMPREHENSIVE METABOLIC PANEL WITH GFR
ALT: 15 IU/L (ref 0–32)
AST: 18 IU/L (ref 0–40)
Albumin: 4.3 g/dL (ref 3.8–4.9)
Alkaline Phosphatase: 105 IU/L (ref 49–135)
BUN/Creatinine Ratio: 15 (ref 9–23)
BUN: 14 mg/dL (ref 6–24)
Bilirubin Total: 0.4 mg/dL (ref 0.0–1.2)
CO2: 25 mmol/L (ref 20–29)
Calcium: 9.2 mg/dL (ref 8.7–10.2)
Chloride: 102 mmol/L (ref 96–106)
Creatinine, Ser: 0.91 mg/dL (ref 0.57–1.00)
Globulin, Total: 2.6 g/dL (ref 1.5–4.5)
Glucose: 138 mg/dL — ABNORMAL HIGH (ref 70–99)
Potassium: 3.7 mmol/L (ref 3.5–5.2)
Sodium: 140 mmol/L (ref 134–144)
Total Protein: 6.9 g/dL (ref 6.0–8.5)
eGFR: 73 mL/min/1.73

## 2024-03-26 LAB — TSH RFX ON ABNORMAL TO FREE T4: TSH: 13.7 u[IU]/mL — ABNORMAL HIGH (ref 0.450–4.500)

## 2024-03-26 LAB — LIPID PANEL
Chol/HDL Ratio: 7.2 ratio — ABNORMAL HIGH (ref 0.0–4.4)
Cholesterol, Total: 223 mg/dL — ABNORMAL HIGH (ref 100–199)
HDL: 31 mg/dL — ABNORMAL LOW
LDL Chol Calc (NIH): 152 mg/dL — ABNORMAL HIGH (ref 0–99)
Triglycerides: 219 mg/dL — ABNORMAL HIGH (ref 0–149)
VLDL Cholesterol Cal: 40 mg/dL (ref 5–40)

## 2024-03-26 LAB — T4F: T4,Free (Direct): 0.67 ng/dL — ABNORMAL LOW (ref 0.82–1.77)

## 2024-03-26 LAB — MICROALBUMIN / CREATININE URINE RATIO
Creatinine, Urine: 254 mg/dL
Microalb/Creat Ratio: 40 mg/g{creat} — ABNORMAL HIGH (ref 0–29)
Microalbumin, Urine: 100.4 ug/mL

## 2024-03-26 LAB — ANA TITER AND PATTERN: Speckled Pattern: 1:160 {titer} — ABNORMAL HIGH

## 2024-03-26 LAB — VITAMIN D 25 HYDROXY (VIT D DEFICIENCY, FRACTURES): Vit D, 25-Hydroxy: 20.2 ng/mL — ABNORMAL LOW (ref 30.0–100.0)

## 2024-03-26 LAB — VITAMIN B12: Vitamin B-12: 147 pg/mL — ABNORMAL LOW (ref 232–1245)

## 2024-03-31 ENCOUNTER — Ambulatory Visit: Payer: Self-pay | Admitting: Family Medicine

## 2024-03-31 ENCOUNTER — Encounter: Payer: Self-pay | Admitting: Family Medicine

## 2024-03-31 DIAGNOSIS — E063 Autoimmune thyroiditis: Secondary | ICD-10-CM

## 2024-03-31 DIAGNOSIS — E559 Vitamin D deficiency, unspecified: Secondary | ICD-10-CM

## 2024-03-31 MED ORDER — LEVOTHYROXINE SODIUM 100 MCG PO TABS: 100.0000 ug | ORAL_TABLET | Freq: Every day | ORAL | 0 refills | Status: AC

## 2024-03-31 MED ORDER — VITAMIN D (ERGOCALCIFEROL) 1.25 MG (50000 UNIT) PO CAPS: 50000.0000 [IU] | ORAL_CAPSULE | ORAL | 1 refills | Status: AC

## 2024-03-31 NOTE — Progress Notes (Signed)
 Called patient and advised her of the provider's recommendations, stated that she would call the office back to get the B12 injections when she knows when it would be a convenient time for her to come in.  Otherwise patient verbalized understanding.

## 2024-07-28 ENCOUNTER — Ambulatory Visit
